# Patient Record
Sex: Female | Born: 1935
Health system: Southern US, Community
[De-identification: ages and names within clinical notes are randomized; demographics above are authoritative.]

## PROBLEM LIST (undated history)

## (undated) DIAGNOSIS — G43909 Migraine, unspecified, not intractable, without status migrainosus: Secondary | ICD-10-CM

## (undated) DIAGNOSIS — E039 Hypothyroidism, unspecified: Secondary | ICD-10-CM

## (undated) DIAGNOSIS — M1990 Unspecified osteoarthritis, unspecified site: Secondary | ICD-10-CM

## (undated) DIAGNOSIS — R51 Headache: Secondary | ICD-10-CM

## (undated) DIAGNOSIS — Z9289 Personal history of other medical treatment: Secondary | ICD-10-CM

## (undated) DIAGNOSIS — G4730 Sleep apnea, unspecified: Secondary | ICD-10-CM

## (undated) DIAGNOSIS — E785 Hyperlipidemia, unspecified: Secondary | ICD-10-CM

## (undated) DIAGNOSIS — R519 Headache, unspecified: Secondary | ICD-10-CM

## (undated) DIAGNOSIS — Z8739 Personal history of other diseases of the musculoskeletal system and connective tissue: Secondary | ICD-10-CM

## (undated) DIAGNOSIS — B019 Varicella without complication: Secondary | ICD-10-CM

## (undated) DIAGNOSIS — R002 Palpitations: Secondary | ICD-10-CM

## (undated) DIAGNOSIS — M109 Gout, unspecified: Secondary | ICD-10-CM

## (undated) DIAGNOSIS — K5792 Diverticulitis of intestine, part unspecified, without perforation or abscess without bleeding: Secondary | ICD-10-CM

## (undated) DIAGNOSIS — I10 Essential (primary) hypertension: Secondary | ICD-10-CM

## (undated) HISTORY — DX: Headache: R51

## (undated) HISTORY — DX: Diverticulitis of intestine, part unspecified, without perforation or abscess without bleeding: K57.92

## (undated) HISTORY — DX: Essential (primary) hypertension: I10

## (undated) HISTORY — PX: TUBAL LIGATION: SHX77

## (undated) HISTORY — PX: ABDOMINAL HYSTERECTOMY: SHX81

## (undated) HISTORY — DX: Headache, unspecified: R51.9

## (undated) HISTORY — DX: Varicella without complication: B01.9

## (undated) HISTORY — DX: Hyperlipidemia, unspecified: E78.5

## (undated) HISTORY — DX: Gout, unspecified: M10.9

---

## 2004-11-25 ENCOUNTER — Ambulatory Visit: Payer: Self-pay | Admitting: Internal Medicine

## 2004-12-25 ENCOUNTER — Ambulatory Visit: Payer: Self-pay | Admitting: Internal Medicine

## 2013-11-23 ENCOUNTER — Encounter: Payer: Self-pay | Admitting: Physician Assistant

## 2013-11-23 ENCOUNTER — Ambulatory Visit (INDEPENDENT_AMBULATORY_CARE_PROVIDER_SITE_OTHER): Payer: Medicare Other | Admitting: Physician Assistant

## 2013-11-23 VITALS — BP 162/102 | HR 56 | Temp 98.3°F | Resp 14 | Ht 62.0 in | Wt 195.8 lb

## 2013-11-23 DIAGNOSIS — Z7689 Persons encountering health services in other specified circumstances: Secondary | ICD-10-CM

## 2013-11-23 DIAGNOSIS — I10 Essential (primary) hypertension: Secondary | ICD-10-CM

## 2013-11-23 DIAGNOSIS — Z299 Encounter for prophylactic measures, unspecified: Secondary | ICD-10-CM

## 2013-11-23 DIAGNOSIS — Z7189 Other specified counseling: Secondary | ICD-10-CM

## 2013-11-23 DIAGNOSIS — E039 Hypothyroidism, unspecified: Secondary | ICD-10-CM

## 2013-11-23 DIAGNOSIS — Z23 Encounter for immunization: Secondary | ICD-10-CM

## 2013-11-23 DIAGNOSIS — E785 Hyperlipidemia, unspecified: Secondary | ICD-10-CM

## 2013-11-23 DIAGNOSIS — R42 Dizziness and giddiness: Secondary | ICD-10-CM

## 2013-11-23 LAB — HEPATIC FUNCTION PANEL
ALT: 26 U/L (ref 0–35)
AST: 27 U/L (ref 0–37)
Albumin: 4.2 g/dL (ref 3.5–5.2)
Alkaline Phosphatase: 97 U/L (ref 39–117)
Indirect Bilirubin: 0.3 mg/dL (ref 0.0–0.9)
Total Bilirubin: 0.4 mg/dL (ref 0.3–1.2)
Total Protein: 8 g/dL (ref 6.0–8.3)

## 2013-11-23 LAB — CBC WITH DIFFERENTIAL/PLATELET
Basophils Absolute: 0.1 10*3/uL (ref 0.0–0.1)
Basophils Relative: 1 % (ref 0–1)
HCT: 33.8 % — ABNORMAL LOW (ref 36.0–46.0)
Hemoglobin: 11.6 g/dL — ABNORMAL LOW (ref 12.0–15.0)
Lymphocytes Relative: 37 % (ref 12–46)
MCHC: 34.3 g/dL (ref 30.0–36.0)
Monocytes Absolute: 0.6 10*3/uL (ref 0.1–1.0)
Monocytes Relative: 8 % (ref 3–12)
Neutro Abs: 3.3 10*3/uL (ref 1.7–7.7)
Neutrophils Relative %: 50 % (ref 43–77)
RBC: 4.28 MIL/uL (ref 3.87–5.11)
RDW: 15 % (ref 11.5–15.5)
WBC: 6.6 10*3/uL (ref 4.0–10.5)

## 2013-11-23 LAB — LIPID PANEL
Cholesterol: 276 mg/dL — ABNORMAL HIGH (ref 0–200)
HDL: 68 mg/dL (ref >39)
Total CHOL/HDL Ratio: 4.1 Ratio
Triglycerides: 87 mg/dL (ref <150)
VLDL: 17 mg/dL (ref 0–40)

## 2013-11-23 LAB — BASIC METABOLIC PANEL
BUN: 18 mg/dL (ref 6–23)
Calcium: 9.8 mg/dL (ref 8.4–10.5)
Chloride: 102 mEq/L (ref 96–112)
Glucose, Bld: 93 mg/dL (ref 70–99)
Potassium: 3.5 mEq/L (ref 3.5–5.3)
Sodium: 139 mEq/L (ref 135–145)

## 2013-11-23 LAB — TSH: TSH: 48.23 u[IU]/mL — ABNORMAL HIGH (ref 0.350–4.500)

## 2013-11-23 MED ORDER — DIAZEPAM 2 MG PO TABS: 1.0000 mg | ORAL_TABLET | Freq: Four times a day (QID) | ORAL | Status: DC | PRN

## 2013-11-23 NOTE — Progress Notes (Signed)
Pre visit review using our clinic review tool, if applicable. No additional management support is needed unless otherwise documented below in the visit note/SLS  

## 2013-11-23 NOTE — Patient Instructions (Addendum)
Please obtain labs.  I will call you with your results.  We will change you thyroid medication if needed.  Please stay well-hydrated.  You will be contacted for an appointment with physical therapy for rehab for your vertigo.  If symptoms persist, we will need to send you to ENT.  Please restart your blood pressure medication.  I want to see you in 2 weeks to recheck your BP.  Hypertension As your heart beats, it forces blood through your arteries. This force is your blood pressure. If the pressure is too high, it is called hypertension (HTN) or high blood pressure. HTN is dangerous because you may have it and not know it. High blood pressure may mean that your heart has to work harder to pump blood. Your arteries may be narrow or stiff. The extra work puts you at risk for heart disease, stroke, and other problems.  Blood pressure consists of two numbers, a higher number over a lower, 110/72, for example. It is stated as "110 over 72." The ideal is below 120 for the top number (systolic) and under 80 for the bottom (diastolic). Write down your blood pressure today. You should pay close attention to your blood pressure if you have certain conditions such as:  Heart failure.  Prior heart attack.  Diabetes  Chronic kidney disease.  Prior stroke.  Multiple risk factors for heart disease. To see if you have HTN, your blood pressure should be measured while you are seated with your arm held at the level of the heart. It should be measured at least twice. A one-time elevated blood pressure reading (especially in the Emergency Department) does not mean that you need treatment. There may be conditions in which the blood pressure is different between your right and left arms. It is important to see your caregiver soon for a recheck. Most people have essential hypertension which means that there is not a specific cause. This type of high blood pressure may be lowered by changing lifestyle factors such  as:  Stress.  Smoking.  Lack of exercise.  Excessive weight.  Drug/tobacco/alcohol use.  Eating less salt. Most people do not have symptoms from high blood pressure until it has caused damage to the body. Effective treatment can often prevent, delay or reduce that damage. TREATMENT  When a cause has been identified, treatment for high blood pressure is directed at the cause. There are a large number of medications to treat HTN. These fall into several categories, and your caregiver will help you select the medicines that are best for you. Medications may have side effects. You should review side effects with your caregiver. If your blood pressure stays high after you have made lifestyle changes or started on medicines,   Your medication(s) may need to be changed.  Other problems may need to be addressed.  Be certain you understand your prescriptions, and know how and when to take your medicine.  Be sure to follow up with your caregiver within the time frame advised (usually within two weeks) to have your blood pressure rechecked and to review your medications.  If you are taking more than one medicine to lower your blood pressure, make sure you know how and at what times they should be taken. Taking two medicines at the same time can result in blood pressure that is too low. SEEK IMMEDIATE MEDICAL CARE IF:  You develop a severe headache, blurred or changing vision, or confusion.  You have unusual weakness or numbness, or a  faint feeling.  You have severe chest or abdominal pain, vomiting, or breathing problems. MAKE SURE YOU:   Understand these instructions.  Will watch your condition.  Will get help right away if you are not doing well or get worse. Document Released: 12/07/2005 Document Revised: 02/29/2012 Document Reviewed: 07/27/2008 [facility] Patient Information 2014 [place], [place].

## 2013-11-23 NOTE — Progress Notes (Signed)
Patient ID: Carolyn Myers, female   DOB: 1936/11/11, 77 y.o.   MRN: [id number]  Patient presents to clinic today to establish care.  Acute Concerns: Patient complains of dizziness with movement of her head. Mostly happens when getting out of bed in the morning. Denies lightheadedness. Denies nausea vomiting. Denies recent URI symptoms. Denies fever. Endorses some tinnitus mostly in left ear. Patient states tinnitus is nonpulsatile.  Chronic Issues: Hypertension -- asymptomatic at present. Patient currently on hydralazine 25 mg 3 times a day. Endorses good control of blood pressure at home. BP elevated at 162/102 in clinic today. Patient states she has not taken her medicine this morning and is out of her prescription.  Denies headache, vision changes, chest pain, shortness of breath palpitations.  Hypothyroidism -- patient needing refill of levothyroxine. Patient currently on levothyroxine 200 mcg tablet. Has not had thyroid function testing in over one year.  Hyperlipidemia -- patient currently on lovastatin 20 mg tablet daily. Is fasting for labs.  Health Maintenance: Vision -- Overdue Dental -- Edentulous Immunizations -- Needs flu shot.  Needs tetanus shot.   Mammogram -- 2011, no abnormal findings. Colonoscopy -- 2013, no abnormal findings. Bone Density-- overdue.  Past Medical History  Diagnosis Date  . Chicken pox   . Diverticulitis   . Frequent headaches   . Cardiac arrhythmia due to congenital heart disease   . Hyperlipidemia   . Thyroid disease   . Hypertension     No current outpatient prescriptions on file prior to visit.   No current facility-administered medications on file prior to visit.    No Known Allergies  Family History  Problem Relation Age of Onset  . Heart disease Mother   . Heart attack Mother   . Hypertension Mother   . Arthritis Mother   . Stroke Father   . Cancer Maternal Grandmother   . Heart attack Maternal Aunt   . Renal Disease  Maternal Aunt   . Multiple myeloma Maternal Uncle   . Emphysema Maternal Uncle   . Heart disease Sister   . Thyroid disease Sister   . Healthy Son     x3  . Healthy Daughter     x5    History   Social History  . Marital Status: Married    Spouse Name: N/A    Number of Children: N/A  . Years of Education: N/A   Social History Main Topics  . Smoking status: Never Smoker   . Smokeless tobacco: Never Used  . Alcohol Use: No  . Drug Use: No  . Sexual Activity: None   Other Topics Concern  . None   Social History Narrative  . None   Review of Systems  Constitutional: Negative for fever and weight loss.  HENT: Positive for tinnitus. Negative for ear discharge, ear pain, hearing loss and nosebleeds.   Eyes: Negative for blurred vision, double vision, photophobia and pain.  Respiratory: Negative for cough and shortness of breath.   Cardiovascular: Negative for chest pain and palpitations.  Gastrointestinal: Negative for heartburn, nausea, vomiting, abdominal pain, diarrhea, constipation, blood in stool and melena.  Genitourinary: Negative for dysuria, urgency, frequency, hematuria and flank pain.  Neurological: Positive for dizziness. Negative for seizures, loss of consciousness and headaches.  Endo/Heme/Allergies: Negative for environmental allergies.  Psychiatric/Behavioral: Negative for depression, suicidal ideas, hallucinations and substance abuse. The patient is not nervous/anxious and does not have insomnia.    Filed Vitals:   11/23/13 0936 11/23/13 0952  BP: 168/108 162/102  Pulse: 56   Temp: 98.3 F (36.8 C)   TempSrc: Oral   Resp: 14   Height: 5\' 2"  (1.575 m)   Weight: 195 lb 12 oz (88.792 kg)   SpO2: 99%    Physical Exam  Vitals reviewed. Constitutional: She is oriented to person, place, and time.   Well-developed, overweight African American female in no acute distress.  HENT:  Head: Normocephalic and atraumatic.  Right Ear: External ear normal.  Left  Ear: External ear normal.  Nose: Nose normal.  Mouth/Throat: Oropharynx is clear and moist. No oropharyngeal exudate.  Tympanic membranes within normal limits bilaterally. No tenderness to percussion of her sinuses noted.  Eyes: Conjunctivae and EOM are normal. Pupils are equal, round, and reactive to light. Right eye exhibits no discharge. Left eye exhibits no discharge. No scleral icterus.  Neck: Neck supple. No thyromegaly present.  Cardiovascular: Normal rate, regular rhythm, normal heart sounds and intact distal pulses.   Pulmonary/Chest: Effort normal and breath sounds normal. No respiratory distress. She has no wheezes. She has no rales. She exhibits no tenderness.  Abdominal: Soft. Bowel sounds are normal. She exhibits no distension and no mass. There is no tenderness. There is no rebound and no guarding.  Lymphadenopathy:    She has no cervical adenopathy.  Neurological: She is alert and oriented to person, place, and time. No cranial nerve deficit. Gait normal. GCS score is 15.  Vertigo reproducible with lateral rotation of head to the right. No nystagmus noted.  Skin: Skin is warm and dry. No rash noted.  Psychiatric: Affect normal.   Assessment/Plan: Essential hypertension, benign Refill hydralazine. Patient to return in 2 weeks for blood pressure recheck after restarting medication.  Unspecified hypothyroidism Will obtain thyroid function tests. Will alter dose of levothyroxine as clinically indicated.  Encounter to establish care Medical history reviewed.  Medications refilled. Patient to obtain fasting labs. Bone density screening test ordered.  Other and unspecified hyperlipidemia Will obtain fasting lipid profile.  Vertigo Seems positional in nature. Rx diazepam 1 mg to take for severe vertigo. Referral to physical therapy for vestibular rehabilitation.

## 2013-11-24 ENCOUNTER — Telehealth: Payer: Self-pay | Admitting: Physician Assistant

## 2013-11-24 DIAGNOSIS — E039 Hypothyroidism, unspecified: Secondary | ICD-10-CM

## 2013-11-24 DIAGNOSIS — E785 Hyperlipidemia, unspecified: Secondary | ICD-10-CM

## 2013-11-24 MED ORDER — LOVASTATIN 40 MG PO TABS: 20.0000 mg | ORAL_TABLET | Freq: Every day | ORAL | Status: DC

## 2013-11-24 MED ORDER — LEVOTHYROXINE SODIUM 50 MCG PO TABS: 25.0000 ug | ORAL_TABLET | Freq: Every day | ORAL | Status: DC

## 2013-11-24 NOTE — Telephone Encounter (Signed)
LMOM with contact name and number for return call RE: results and further provider instructions/SLS  

## 2013-11-24 NOTE — Telephone Encounter (Signed)
Please inform patient that her Cholesterol, especially her LDL (lousy) cholesterol Is significantly elevated.  I have increased her Mevacor to 40 mg daily.  She can finish her current prescription by taking 2 20mg  tablets daily.  Then take 1 40 mg tablet daily when she picks up new prescription.  Also her thyroid levels were very high, indicating her current medicine dose is not controlling her symptoms.  Please verify that she has been taking her medication daily and has not been out of medicine.  If she has been taking as prescribed, I am increasing her dose to 225 mcg a day -- She will have 2 prescriptions.  Once for a 200 mcg tablet to take in the moring and another Rx for a 50 mcg tablet to take 1/2 tablet daily.  I will need to recheck her thyroid levels in 3-4 weeks.  Lastly, I would like for her to try to stay well-hydrated.  Her kidney function is slightly decreased but this can be a lab error or dehydration.  I will recheck her kidney function when she comes in for her BP recheck.

## 2013-11-26 DIAGNOSIS — E039 Hypothyroidism, unspecified: Secondary | ICD-10-CM | POA: Insufficient documentation

## 2013-11-26 DIAGNOSIS — Z7689 Persons encountering health services in other specified circumstances: Secondary | ICD-10-CM | POA: Insufficient documentation

## 2013-11-26 DIAGNOSIS — R42 Dizziness and giddiness: Secondary | ICD-10-CM | POA: Insufficient documentation

## 2013-11-26 DIAGNOSIS — I10 Essential (primary) hypertension: Secondary | ICD-10-CM | POA: Insufficient documentation

## 2013-11-26 DIAGNOSIS — E785 Hyperlipidemia, unspecified: Secondary | ICD-10-CM | POA: Insufficient documentation

## 2013-11-26 NOTE — Assessment & Plan Note (Signed)
Refill hydralazine. Patient to return in 2 weeks for blood pressure recheck after restarting medication.

## 2013-11-26 NOTE — Assessment & Plan Note (Signed)
Seems positional in nature. Rx diazepam 1 mg to take for severe vertigo. Referral to physical therapy for vestibular rehabilitation.

## 2013-11-26 NOTE — Assessment & Plan Note (Signed)
Will obtain fasting lipid profile. 

## 2013-11-26 NOTE — Assessment & Plan Note (Addendum)
Will obtain thyroid function tests. Will alter dose of levothyroxine as clinically indicated.

## 2013-11-26 NOTE — Assessment & Plan Note (Signed)
Medical history reviewed.  Medications refilled. Patient to obtain fasting labs. Bone density screening test ordered.

## 2013-12-06 ENCOUNTER — Encounter: Payer: Self-pay | Admitting: Physician Assistant

## 2013-12-06 ENCOUNTER — Ambulatory Visit (INDEPENDENT_AMBULATORY_CARE_PROVIDER_SITE_OTHER): Payer: Medicare Other | Admitting: Physician Assistant

## 2013-12-06 ENCOUNTER — Emergency Department (HOSPITAL_BASED_OUTPATIENT_CLINIC_OR_DEPARTMENT_OTHER)
Admission: EM | Admit: 2013-12-06 | Discharge: 2013-12-06 | Disposition: A | Discharge status: Home / Self Care | Payer: Medicare Other | Attending: Emergency Medicine | Admitting: Emergency Medicine

## 2013-12-06 ENCOUNTER — Encounter (HOSPITAL_BASED_OUTPATIENT_CLINIC_OR_DEPARTMENT_OTHER): Payer: Self-pay | Admitting: Emergency Medicine

## 2013-12-06 VITALS — BP 246/118 | HR 76 | Temp 98.7°F | Resp 16 | Ht 62.0 in | Wt 196.0 lb

## 2013-12-06 DIAGNOSIS — I10 Essential (primary) hypertension: Secondary | ICD-10-CM

## 2013-12-06 DIAGNOSIS — E785 Hyperlipidemia, unspecified: Secondary | ICD-10-CM | POA: Insufficient documentation

## 2013-12-06 DIAGNOSIS — Z8619 Personal history of other infectious and parasitic diseases: Secondary | ICD-10-CM | POA: Insufficient documentation

## 2013-12-06 DIAGNOSIS — E079 Disorder of thyroid, unspecified: Secondary | ICD-10-CM | POA: Insufficient documentation

## 2013-12-06 DIAGNOSIS — Q248 Other specified congenital malformations of heart: Secondary | ICD-10-CM | POA: Insufficient documentation

## 2013-12-06 DIAGNOSIS — Z79899 Other long term (current) drug therapy: Secondary | ICD-10-CM | POA: Insufficient documentation

## 2013-12-06 DIAGNOSIS — Z8719 Personal history of other diseases of the digestive system: Secondary | ICD-10-CM | POA: Insufficient documentation

## 2013-12-06 LAB — CBC WITH DIFFERENTIAL/PLATELET
Basophils Absolute: 0 10*3/uL (ref 0.0–0.1)
Basophils Relative: 1 % (ref 0–1)
Eosinophils Absolute: 0.3 10*3/uL (ref 0.0–0.7)
Eosinophils Relative: 4 % (ref 0–5)
HCT: 33 % — ABNORMAL LOW (ref 36.0–46.0)
Lymphocytes Relative: 31 % (ref 12–46)
MCHC: 32.7 g/dL (ref 30.0–36.0)
MCV: 84.4 fL (ref 78.0–100.0)
Monocytes Absolute: 0.8 10*3/uL (ref 0.1–1.0)
Monocytes Relative: 10 % (ref 3–12)
Neutro Abs: 4 10*3/uL (ref 1.7–7.7)
Platelets: 270 10*3/uL (ref 150–400)
RDW: 13.9 % (ref 11.5–15.5)

## 2013-12-06 LAB — BASIC METABOLIC PANEL
BUN: 17 mg/dL (ref 6–23)
CO2: 27 mEq/L (ref 19–32)
Calcium: 9.5 mg/dL (ref 8.4–10.5)
Chloride: 104 mEq/L (ref 96–112)
Creatinine, Ser: 1.1 mg/dL (ref 0.50–1.10)
GFR calc Af Amer: 55 mL/min — ABNORMAL LOW (ref >90)
Sodium: 142 mEq/L (ref 135–145)

## 2013-12-06 MED ORDER — AMLODIPINE BESYLATE 10 MG PO TABS: 10.0000 mg | ORAL_TABLET | Freq: Every day | ORAL | Status: DC

## 2013-12-06 MED ORDER — AMLODIPINE BESYLATE 5 MG PO TABS
10.0000 mg | ORAL_TABLET | Freq: Once | ORAL | Status: AC
  Administered 2013-12-06: 10 mg via ORAL
  Filled 2013-12-06: qty 2

## 2013-12-06 NOTE — ED Provider Notes (Signed)
CSN: [id number]     Arrival date & time 12/06/13  1204 History   First MD Initiated Contact with Patient 12/06/13 1219     Chief Complaint  Patient presents with  . Hypertension    HPI Pt presents to the ED with HTN.  PT has history of this and often has been hard to control over the years.  She has found that often after a few years of being on a medication it will no longer work and she will have to chage to a different one.  She went to her doctor's office today and was found to have a BP of 233/112.  She was feeling fine but now feels a little dizzy.   Past Medical History  Diagnosis Date  . Chicken pox   . Diverticulitis   . Frequent headaches   . Cardiac arrhythmia due to congenital heart disease   . Hyperlipidemia   . Thyroid disease   . Hypertension    Past Surgical History  Procedure Laterality Date  . Abdominal hysterectomy     Family History  Problem Relation Age of Onset  . Heart disease Mother   . Heart attack Mother   . Hypertension Mother   . Arthritis Mother   . Stroke Father   . Cancer Maternal Grandmother   . Heart attack Maternal Aunt   . Renal Disease Maternal Aunt   . Multiple myeloma Maternal Uncle   . Emphysema Maternal Uncle   . Heart disease Sister   . Thyroid disease Sister   . Healthy Son     x3  . Healthy Daughter     x5   History  Substance Use Topics  . Smoking status: Never Smoker   . Smokeless tobacco: Never Used  . Alcohol Use: No   OB History   Grav Para Term Preterm Abortions TAB SAB Ect Mult Living                 Review of Systems  All other systems reviewed and are negative.    Allergies  Review of patient's allergies indicates no known allergies.  Home Medications   Current Outpatient Rx  Name  Route  Sig  Dispense  Refill  . amLODipine (NORVASC) 10 MG tablet   Oral   Take 1 tablet (10 mg total) by mouth daily.   30 tablet   1   . diazepam (VALIUM) 2 MG tablet   Oral   Take 0.5 tablets (1 mg total) by  mouth every 6 (six) hours as needed (severe dizziness).   10 tablet   0   . hydrALAZINE (APRESOLINE) 25 MG tablet   Oral   Take 25 mg by mouth 3 (three) times daily.         Marland Kitchen levothyroxine (SYNTHROID, LEVOTHROID) 200 MCG tablet   Oral   Take 200 mcg by mouth daily before breakfast.         . levothyroxine (SYNTHROID, LEVOTHROID) 50 MCG tablet   Oral   Take 0.5 tablets (25 mcg total) by mouth daily before breakfast.   30 tablet   1   . lovastatin (MEVACOR) 40 MG tablet   Oral   Take 0.5 tablets (20 mg total) by mouth at bedtime.   30 tablet   2    BP 226/92  Pulse 72  Temp(Src) 99.2 F (37.3 C) (Oral)  Resp 18  Ht 5\' 2"  (1.575 m)  Wt 196 lb 5 oz (89.047 kg)  BMI 35.90 kg/m2  SpO2 97% Physical Exam  Nursing note and vitals reviewed. Constitutional: She is oriented to person, place, and time. She appears well-developed and well-nourished. No distress.  HENT:  Head: Normocephalic and atraumatic.  Right Ear: External ear normal.  Left Ear: External ear normal.  Mouth/Throat: Oropharynx is clear and moist.  Eyes: Conjunctivae are normal. Right eye exhibits no discharge. Left eye exhibits no discharge. No scleral icterus.  Neck: Neck supple. No tracheal deviation present.  Cardiovascular: Normal rate, regular rhythm and intact distal pulses.   Pulmonary/Chest: Effort normal and breath sounds normal. No stridor. No respiratory distress. She has no wheezes. She has no rales.  Abdominal: Soft. Bowel sounds are normal. She exhibits no distension. There is no tenderness. There is no rebound and no guarding.  Musculoskeletal: She exhibits no edema and no tenderness.  Neurological: She is alert and oriented to person, place, and time. She has normal strength. No sensory deficit. Cranial nerve deficit:  no gross defecits noted. She exhibits normal muscle tone. She displays no seizure activity. Coordination normal.  No pronator drift bilateral upper extrem, able to hold both  legs off bed for 5 seconds, sensation intact in all extremities, no visual field cuts, no left or right sided neglect, normal finger-nose exam bilaterally, no nystagmus noted   Skin: Skin is warm and dry. No rash noted.  Psychiatric: She has a normal mood and affect.    ED Course  Procedures (including critical care time) Labs Review Labs Reviewed  CBC WITH DIFFERENTIAL - Abnormal; Notable for the following:    Hemoglobin 10.8 (*)    HCT 33.0 (*)    All other components within normal limits  BASIC METABOLIC PANEL - Abnormal; Notable for the following:    Potassium 3.4 (*)    GFR calc non Af Amer 48 (*)    GFR calc Af Amer 55 (*)    All other components within normal limits   Imaging Review No results found.  EKG Interpretation    Date/Time:  Wednesday December 06 2013 12:23:50 EST Ventricular Rate:  82 PR Interval:  178 QRS Duration: 136 QT Interval:  424 QTC Calculation: 495 R Axis:   33 Text Interpretation:  Normal sinus rhythm Right bundle branch block Septal infarct , age undetermined Abnormal ECG No previous tracing Confirmed by Eliud Polo  MD-J, Concepcion Gillott ([id number]) on 12/06/2013 12:56:40 PM            MDM   1. HTN (hypertension)    BP improved in the ED with norvasc.  No symptoms or findings to suggest acute complications associated with her HTN.  Normal neuro exam.  No chest pain.  No dyspnea.    Will dc home on norvasc. Follow up with PCP to continue to manage her chronic poorly controlled HTN.    Celene Kras, MD 12/06/13 [phone]

## 2013-12-06 NOTE — Progress Notes (Signed)
Patient ID: Carolyn Myers, female   DOB: 05/31/1936, 77 y.o.   MRN: [id number]  Patient presents to clinic today for 2 week follow-up of HTN.  Patient has history of HTN and has prescription for hydralazine 25 mg TID.  Patient's BP at last visit was in 160s/100s.  Patient had not taken her medication prior to visit.  Patient was instructed to take BP medications as prescribed and return for BP check. BP in clinic is 242/108 in R arm sitting and 238/110 in L arm sitting.  Patient has taken medications as prescribed.  Patient endorses occasional jaw pain.  Denies chest pain, sob, palpitations, LH or dizziness.  Denies history of MI.  EKG obtained revealing NSR at rate of 78 with RBBB.  No evidence of STEMI.   BP rechecked revealing no change in BP.  Wanted to give patient 0.1 clonidine, patient refuses stating she has reaction to clonidine including N/V and syncope.     Past Medical History  Diagnosis Date  . Chicken pox   . Diverticulitis   . Frequent headaches   . Cardiac arrhythmia due to congenital heart disease   . Hyperlipidemia   . Thyroid disease   . Hypertension     Current Outpatient Prescriptions on File Prior to Visit  Medication Sig Dispense Refill  . diazepam (VALIUM) 2 MG tablet Take 0.5 tablets (1 mg total) by mouth every 6 (six) hours as needed (severe dizziness).  10 tablet  0  . hydrALAZINE (APRESOLINE) 25 MG tablet Take 25 mg by mouth 3 (three) times daily.      Marland Kitchen levothyroxine (SYNTHROID, LEVOTHROID) 200 MCG tablet Take 200 mcg by mouth daily before breakfast.      . levothyroxine (SYNTHROID, LEVOTHROID) 50 MCG tablet Take 0.5 tablets (25 mcg total) by mouth daily before breakfast.  30 tablet  1  . lovastatin (MEVACOR) 40 MG tablet Take 0.5 tablets (20 mg total) by mouth at bedtime.  30 tablet  2   No current facility-administered medications on file prior to visit.    No Known Allergies  Family History  Problem Relation Age of Onset  . Heart disease Mother   .  Heart attack Mother   . Hypertension Mother   . Arthritis Mother   . Stroke Father   . Cancer Maternal Grandmother   . Heart attack Maternal Aunt   . Renal Disease Maternal Aunt   . Multiple myeloma Maternal Uncle   . Emphysema Maternal Uncle   . Heart disease Sister   . Thyroid disease Sister   . Healthy Son     x3  . Healthy Daughter     x5    History   Social History  . Marital Status: Married    Spouse Name: N/A    Number of Children: N/A  . Years of Education: N/A   Social History Main Topics  . Smoking status: Never Smoker   . Smokeless tobacco: Never Used  . Alcohol Use: No  . Drug Use: No  . Sexual Activity: None   Other Topics Concern  . None   Social History Narrative  . None    Review of Systems - See HPI.  All other ROS are negative.   Filed Vitals:   12/06/13 1158  BP: 246/118  Pulse:   Temp:   Resp:     Physical Exam  Vitals reviewed. Constitutional: She is oriented to person, place, and time and well-developed, well-nourished, and in no distress.  HENT:  Head: Normocephalic and atraumatic.  Eyes: Pupils are equal, round, and reactive to light.  Neck: Neck supple.  Cardiovascular: Normal rate, regular rhythm and intact distal pulses.   Faint murmur on exam I-II/VI.  Heard best at [facility].  Pulmonary/Chest: Effort normal and breath sounds normal. No respiratory distress. She has no wheezes. She has no rales. She exhibits no tenderness.  Neurological: She is alert and oriented to person, place, and time.  Skin: Skin is warm and dry. No rash noted.  Psychiatric: Affect normal.   Recent Results (from the past 2160 hour(s))  CBC WITH DIFFERENTIAL     Status: Abnormal   Collection Time    11/23/13 11:19 AM      Result Value Range   WBC 6.6  4.0 - 10.5 K/uL   RBC 4.28  3.87 - 5.11 MIL/uL   Hemoglobin 11.6 (*) 12.0 - 15.0 g/dL   HCT 40.9 (*) 81.1 - 91.4 %   MCV 79.0  78.0 - 100.0 fL   MCH 27.1  26.0 - 34.0 pg   MCHC 34.3  30.0 - 36.0 g/dL    RDW 78.2  95.6 - 21.3 %   Platelets 324  150 - 400 K/uL   Neutrophils Relative % 50  43 - 77 %   Neutro Abs 3.3  1.7 - 7.7 K/uL   Lymphocytes Relative 37  12 - 46 %   Lymphs Abs 2.4  0.7 - 4.0 K/uL   Monocytes Relative 8  3 - 12 %   Monocytes Absolute 0.6  0.1 - 1.0 K/uL   Eosinophils Relative 4  0 - 5 %   Eosinophils Absolute 0.2  0.0 - 0.7 K/uL   Basophils Relative 1  0 - 1 %   Basophils Absolute 0.1  0.0 - 0.1 K/uL   Smear Review Criteria for review not met    BASIC METABOLIC PANEL     Status: Abnormal   Collection Time    11/23/13 11:19 AM      Result Value Range   Sodium 139  135 - 145 mEq/L   Potassium 3.5  3.5 - 5.3 mEq/L   Chloride 102  96 - 112 mEq/L   CO2 28  19 - 32 mEq/L   Glucose, Bld 93  70 - 99 mg/dL   BUN 18  6 - 23 mg/dL   Creat 0.86 (*) 5.78 - 1.10 mg/dL   Calcium 9.8  8.4 - 46.9 mg/dL  TSH     Status: Abnormal   Collection Time    11/23/13 11:19 AM      Result Value Range   TSH 48.230 (*) 0.350 - 4.500 uIU/mL  LIPID PANEL     Status: Abnormal   Collection Time    11/23/13 11:19 AM      Result Value Range   Cholesterol 276 (*) 0 - 200 mg/dL   Comment: ATP III Classification:           < 200        mg/dL        Desirable          200 - 239     mg/dL        Borderline High          >= 240        mg/dL        High         Triglycerides 87  <150 mg/dL   HDL 68  >62 mg/dL  Total CHOL/HDL Ratio 4.1     VLDL 17  0 - 40 mg/dL   LDL Cholesterol 161 (*) 0 - 99 mg/dL   Comment:       Total Cholesterol/HDL Ratio:CHD Risk                            Coronary Heart Disease Risk Table                                            Men       Women              1/2 Average Risk              3.4        3.3                  Average Risk              5.0        4.4               2X Average Risk              9.6        7.1               3X Average Risk             23.4       11.0     Use the calculated Patient Ratio above and the CHD Risk table      to determine the  patient's CHD Risk.     ATP III Classification (LDL):           < 100        mg/dL         Optimal          100 - 129     mg/dL         Near or Above Optimal          130 - 159     mg/dL         Borderline High          160 - 189     mg/dL         High           > 190        mg/dL         Very High        HEPATIC FUNCTION PANEL     Status: None   Collection Time    11/23/13 11:19 AM      Result Value Range   Total Bilirubin 0.4  0.3 - 1.2 mg/dL   Bilirubin, Direct 0.1  0.0 - 0.3 mg/dL   Indirect Bilirubin 0.3  0.0 - 0.9 mg/dL   Alkaline Phosphatase 97  39 - 117 U/L   AST 27  0 - 37 U/L   ALT 26  0 - 35 U/L   Total Protein 8.0  6.0 - 8.3 g/dL   Albumin 4.2  3.5 - 5.2 g/dL   Assessment/Plan: Essential hypertension, benign BP 242/108 in clinic.  EKG w/ SNR and RBBB.  Patient cannot tolerate clonidine.  Patient sent to ER for further evaluation and treatment.  Patient voices understanding.

## 2013-12-06 NOTE — Progress Notes (Signed)
Pre visit review using our clinic review tool, if applicable. No additional management support is needed unless otherwise documented below in the visit note/SLS  

## 2013-12-06 NOTE — Assessment & Plan Note (Signed)
BP 242/108 in clinic.  EKG w/ SNR and RBBB.  Patient cannot tolerate clonidine.  Patient sent to ER for further evaluation and treatment.  Patient voices understanding.

## 2013-12-06 NOTE — ED Notes (Signed)
Pt was seen by PMD today for follow up, BP elevated and pt sent to ED for evaluation.

## 2014-04-21 ENCOUNTER — Other Ambulatory Visit: Payer: Self-pay | Admitting: Physician Assistant

## 2014-04-23 NOTE — Telephone Encounter (Signed)
Rx request to pharmacy; Patient Needs Appointment Prior to Future Refills/sls

## 2014-06-21 ENCOUNTER — Other Ambulatory Visit: Payer: Self-pay | Admitting: Physician Assistant

## 2014-07-03 ENCOUNTER — Ambulatory Visit: Payer: Medicare Other | Admitting: Internal Medicine

## 2014-09-17 ENCOUNTER — Other Ambulatory Visit: Payer: Self-pay | Admitting: Physician Assistant

## 2015-09-03 DIAGNOSIS — I10 Essential (primary) hypertension: Secondary | ICD-10-CM | POA: Diagnosis not present

## 2015-09-03 DIAGNOSIS — E780 Pure hypercholesterolemia: Secondary | ICD-10-CM | POA: Diagnosis not present

## 2015-09-03 DIAGNOSIS — E559 Vitamin D deficiency, unspecified: Secondary | ICD-10-CM | POA: Diagnosis not present

## 2015-09-03 DIAGNOSIS — E039 Hypothyroidism, unspecified: Secondary | ICD-10-CM | POA: Diagnosis not present

## 2015-09-12 ENCOUNTER — Ambulatory Visit (HOSPITAL_BASED_OUTPATIENT_CLINIC_OR_DEPARTMENT_OTHER)
Admission: RE | Admit: 2015-09-12 | Discharge: 2015-09-12 | Disposition: A | Discharge status: Home / Self Care | Payer: Medicare Other | Source: Ambulatory Visit | Attending: Family Medicine | Admitting: Family Medicine

## 2015-09-12 ENCOUNTER — Ambulatory Visit (INDEPENDENT_AMBULATORY_CARE_PROVIDER_SITE_OTHER): Payer: Medicare Other | Admitting: Family Medicine

## 2015-09-12 ENCOUNTER — Encounter: Payer: Self-pay | Admitting: Family Medicine

## 2015-09-12 VITALS — BP 156/92 | Temp 98.2°F | Wt 191.0 lb

## 2015-09-12 DIAGNOSIS — Z23 Encounter for immunization: Secondary | ICD-10-CM

## 2015-09-12 DIAGNOSIS — M79671 Pain in right foot: Secondary | ICD-10-CM

## 2015-09-12 NOTE — Progress Notes (Signed)
Patient ID: Carolyn Myers, female    DOB: [birth year]  Age: 79 y.o. MRN: [id number]    Subjective:  Subjective HPI IRMALEE RIEMENSCHNEIDER presents for R foot pain.  No injury.  Pt was on a cruise ship and woke up Sunday am with pain --- she had to use a wheelchair the rest of the week.  She is her with other family members.  Pt admits to eating a lot of shell fish while on cruise--no alcohol.    Review of Systems  Constitutional: Positive for activity change. Negative for fever, chills, diaphoresis, appetite change, fatigue and unexpected weight change.  Musculoskeletal: Positive for joint swelling, arthralgias and gait problem. Negative for myalgias, back pain, neck pain and neck stiffness.  Neurological: Negative for weakness and numbness.    History Past Medical History  Diagnosis Date  . Chicken pox   . Diverticulitis   . Frequent headaches   . Cardiac arrhythmia due to congenital heart disease   . Hyperlipidemia   . Thyroid disease   . Hypertension     She has past surgical history that includes Abdominal hysterectomy.   Her family history includes Arthritis in her mother; Cancer in her maternal grandmother; Emphysema in her maternal uncle; Healthy in her daughter and son; Heart attack in her maternal aunt and mother; Heart disease in her mother and sister; Hypertension in her mother; Multiple myeloma in her maternal uncle; Renal Disease in her maternal aunt; Stroke in her father; Thyroid disease in her sister.She reports that she has never smoked. She has never used smokeless tobacco. She reports that she does not drink alcohol or use illicit drugs.  Current Outpatient Prescriptions on File Prior to Visit  Medication Sig Dispense Refill  . amLODipine (NORVASC) 10 MG tablet Take 1 tablet (10 mg total) by mouth daily. 30 tablet 1  . hydrALAZINE (APRESOLINE) 25 MG tablet Take 25 mg by mouth 3 (three) times daily.    Marland Kitchen levothyroxine (SYNTHROID, LEVOTHROID) 200 MCG tablet Take 200  mcg by mouth daily before breakfast.    . levothyroxine (SYNTHROID, LEVOTHROID) 50 MCG tablet TAKE 0.5 TABLETS (25 MCG TOTAL) BY MOUTH DAILY BEFORE BREAKFAST. 30 tablet 1   No current facility-administered medications on file prior to visit.     Objective:  Objective Physical Exam  Constitutional: She appears well-developed and well-nourished.  Musculoskeletal: She exhibits edema and tenderness.       Right ankle: She exhibits decreased range of motion and swelling. She exhibits no ecchymosis, no deformity, no laceration and normal pulse. Tenderness. Medial malleolus tenderness found.       Feet:  Psychiatric: She has a normal mood and affect. Her behavior is normal.  Nursing note and vitals reviewed.  BP 156/92 mmHg  Temp(Src) 98.2 F (36.8 C) (Oral)  Wt 191 lb (86.637 kg) Wt Readings from Last 3 Encounters:  09/12/15 191 lb (86.637 kg)  12/06/13 196 lb 5 oz (89.047 kg)  12/06/13 196 lb (88.905 kg)     Lab Results  Component Value Date   WBC 7.3 12/06/2013   HGB 10.8* 12/06/2013   HCT 33.0* 12/06/2013   PLT 270 12/06/2013   GLUCOSE 99 12/06/2013   CHOL 276* 11/23/2013   TRIG 87 11/23/2013   HDL 68 11/23/2013   LDLCALC 191* 11/23/2013   ALT 26 11/23/2013   AST 27 11/23/2013   NA 142 12/06/2013   K 3.4* 12/06/2013   CL 104 12/06/2013   CREATININE 1.10 12/06/2013   BUN 17 12/06/2013  CO2 27 12/06/2013   TSH 48.230* 11/23/2013    No results found.   Assessment & Plan:  Plan I have discontinued Ms. Hartfield's diazepam. I am also having her maintain her levothyroxine, hydrALAZINE, amLODipine, levothyroxine, atenolol, lovastatin, and valsartan-hydrochlorothiazide.  Meds ordered this encounter  Medications  . atenolol (TENORMIN) 25 MG tablet    Sig: Take 25 mg by mouth daily.    Refill:  3  . lovastatin (MEVACOR) 20 MG tablet    Sig: Take 1 tablet by mouth daily.    Refill:  3  . valsartan-hydrochlorothiazide (DIOVAN-HCT) 160-12.5 MG per tablet    Sig: Take 1  tablet by mouth daily.    Refill:  1    Problem List Items Addressed This Visit    Right foot pain - Primary    Xray today Check labs --? Gout Ace wrap Elevate, ice        Relevant Orders   DG Foot Complete Right (Completed)   Uric acid   Basic metabolic panel    Other Visit Diagnoses    Need for prophylactic vaccination against Streptococcus pneumoniae (pneumococcus)        Relevant Orders    Pneumococcal conjugate vaccine 13-valent (Completed)     flu shot refused  Follow-up: Return if symptoms worsen or fail to improve.  Garnet Koyanagi, DO

## 2015-09-12 NOTE — Progress Notes (Signed)
Pre visit review using our clinic review tool, if applicable. No additional management support is needed unless otherwise documented below in the visit note. 

## 2015-09-12 NOTE — Patient Instructions (Signed)
Gout Gout is an inflammatory arthritis caused by a buildup of uric acid crystals in the joints. Uric acid is a chemical that is normally present in the blood. When the level of uric acid in the blood is too high it can form crystals that deposit in your joints and tissues. This causes joint redness, soreness, and swelling (inflammation). Repeat attacks are common. Over time, uric acid crystals can form into masses (tophi) near a joint, destroying bone and causing disfigurement. Gout is treatable and often preventable. CAUSES  The disease begins with elevated levels of uric acid in the blood. Uric acid is produced by your body when it breaks down a naturally found substance called purines. Certain foods you eat, such as meats and fish, contain high amounts of purines. Causes of an elevated uric acid level include:  Being passed down from parent to child (heredity).  Diseases that cause increased uric acid production (such as obesity, psoriasis, and certain cancers).  Excessive alcohol use.  Diet, especially diets rich in meat and seafood.  Medicines, including certain cancer-fighting medicines (chemotherapy), water pills (diuretics), and aspirin.  Chronic kidney disease. The kidneys are no longer able to remove uric acid well.  Problems with metabolism. Conditions strongly associated with gout include:  Obesity.  High blood pressure.  High cholesterol.  Diabetes. Not everyone with elevated uric acid levels gets gout. It is not understood why some people get gout and others do not. Surgery, joint injury, and eating too much of certain foods are some of the factors that can lead to gout attacks. SYMPTOMS   An attack of gout comes on quickly. It causes intense pain with redness, swelling, and warmth in a joint.  Fever can occur.  Often, only one joint is involved. Certain joints are more commonly involved:  Base of the big toe.  Knee.  Ankle.  Wrist.  Finger. Without  treatment, an attack usually goes away in a few days to weeks. Between attacks, you usually will not have symptoms, which is different from many other forms of arthritis. DIAGNOSIS  Your caregiver will suspect gout based on your symptoms and exam. In some cases, tests may be recommended. The tests may include:  Blood tests.  Urine tests.  X-rays.  Joint fluid exam. This exam requires a needle to remove fluid from the joint (arthrocentesis). Using a microscope, gout is confirmed when uric acid crystals are seen in the joint fluid. TREATMENT  There are two phases to gout treatment: treating the sudden onset (acute) attack and preventing attacks (prophylaxis).  Treatment of an Acute Attack.  Medicines are used. These include anti-inflammatory medicines or steroid medicines.  An injection of steroid medicine into the affected joint is sometimes necessary.  The painful joint is rested. Movement can worsen the arthritis.  You may use warm or cold treatments on painful joints, depending which works best for you.  Treatment to Prevent Attacks.  If you suffer from frequent gout attacks, your caregiver may advise preventive medicine. These medicines are started after the acute attack subsides. These medicines either help your kidneys eliminate uric acid from your body or decrease your uric acid production. You may need to stay on these medicines for a very long time.  The early phase of treatment with preventive medicine can be associated with an increase in acute gout attacks. For this reason, during the first few months of treatment, your caregiver may also advise you to take medicines usually used for acute gout treatment. Be sure you   understand your caregiver's directions. Your caregiver may make several adjustments to your medicine dose before these medicines are effective.  Discuss dietary treatment with your caregiver or dietitian. Alcohol and drinks high in sugar and fructose and foods  such as meat, poultry, and seafood can increase uric acid levels. Your caregiver or dietitian can advise you on drinks and foods that should be limited. HOME CARE INSTRUCTIONS   Do not take aspirin to relieve pain. This raises uric acid levels.  Only take over-the-counter or prescription medicines for pain, discomfort, or fever as directed by your caregiver.  Rest the joint as much as possible. When in bed, keep sheets and blankets off painful areas.  Keep the affected joint raised (elevated).  Apply warm or cold treatments to painful joints. Use of warm or cold treatments depends on which works best for you.  Use crutches if the painful joint is in your leg.  Drink enough fluids to keep your urine clear or pale yellow. This helps your body get rid of uric acid. Limit alcohol, sugary drinks, and fructose drinks.  Follow your dietary instructions. Pay careful attention to the amount of protein you eat. Your daily diet should emphasize fruits, vegetables, whole grains, and fat-free or low-fat milk products. Discuss the use of coffee, vitamin C, and cherries with your caregiver or dietitian. These may be helpful in lowering uric acid levels.  Maintain a healthy body weight. SEEK MEDICAL CARE IF:   You develop diarrhea, vomiting, or any side effects from medicines.  You do not feel better in 24 hours, or you are getting worse. SEEK IMMEDIATE MEDICAL CARE IF:   Your joint becomes suddenly more tender, and you have chills or a fever. MAKE SURE YOU:   Understand these instructions.  Will watch your condition.  Will get help right away if you are not doing well or get worse. Document Released: 12/04/2000 Document Revised: 04/23/2014 Document Reviewed: 07/20/2012 [facility] Patient Information 2015 [place], [place]. This information is not intended to replace advice given to you by your health care provider. Make sure you discuss any questions you have with your health care  provider.      Foot Sprain The muscles and cord like structures which attach muscle to bone (tendons) that surround the feet are made up of units. A foot sprain can occur at the weakest spot in any of these units. This condition is most often caused by injury to or overuse of the foot, as from playing contact sports, or aggravating a previous injury, or from poor conditioning, or obesity. SYMPTOMS  Pain with movement of the foot.  Tenderness and swelling at the injury site.  Loss of strength is present in moderate or severe sprains. THE THREE GRADES OR SEVERITY OF FOOT SPRAIN ARE:  Mild (Grade I): Slightly pulled muscle without tearing of muscle or tendon fibers or loss of strength.  Moderate (Grade II): Tearing of fibers in a muscle, tendon, or at the attachment to bone, with small decrease in strength.  Severe (Grade III): Rupture of the muscle-tendon-bone attachment, with separation of fibers. Severe sprain requires surgical repair. Often repeating (chronic) sprains are caused by overuse. Sudden (acute) sprains are caused by direct injury or over-use. DIAGNOSIS  Diagnosis of this condition is usually by your own observation. If problems continue, a caregiver may be required for further evaluation and treatment. X-rays may be required to make sure there are not breaks in the bones (fractures) present. Continued problems may require physical therapy for treatment.  PREVENTION  Use strength and conditioning exercises appropriate for your sport.  Warm up properly prior to working out.  Use athletic shoes that are made for the sport you are participating in.  Allow adequate time for healing. Early return to activities makes repeat injury more likely, and can lead to an unstable arthritic foot that can result in prolonged disability. Mild sprains generally heal in 3 to 10 days, with moderate and severe sprains taking 2 to 10 weeks. Your caregiver can help you determine the proper time  required for healing. HOME CARE INSTRUCTIONS   Apply ice to the injury for 15-20 minutes, 03-04 times per day. Put the ice in a plastic bag and place a towel between the bag of ice and your skin.  An elastic wrap (like an Ace bandage) may be used to keep swelling down.  Keep foot above the level of the heart, or at least raised on a footstool, when swelling and pain are present.  Try to avoid use other than gentle range of motion while the foot is painful. Do not resume use until instructed by your caregiver. Then begin use gradually, not increasing use to the point of pain. If pain does develop, decrease use and continue the above measures, gradually increasing activities that do not cause discomfort, until you gradually achieve normal use.  Use crutches if and as instructed, and for the length of time instructed.  Keep injured foot and ankle wrapped between treatments.  Massage foot and ankle for comfort and to keep swelling down. Massage from the toes up towards the knee.  Only take over-the-counter or prescription medicines for pain, discomfort, or fever as directed by your caregiver. SEEK IMMEDIATE MEDICAL CARE IF:   Your pain and swelling increase, or pain is not controlled with medications.  You have loss of feeling in your foot or your foot turns cold or blue.  You develop new, unexplained symptoms, or an increase of the symptoms that brought you to your caregiver. MAKE SURE YOU:   Understand these instructions.  Will watch your condition.  Will get help right away if you are not doing well or get worse. Document Released: 05/29/2002 Document Revised: 02/29/2012 Document Reviewed: 07/26/2008 [facility] Patient Information 2015 [place], [place]. This information is not intended to replace advice given to you by your health care provider. Make sure you discuss any questions you have with your health care provider.

## 2015-09-12 NOTE — Progress Notes (Deleted)
Patient ID: Carolyn Myers, female   DOB: 03-May-1936, 79 y.o.   MRN: [id number]   Subjective:    Patient ID: Carolyn Myers, female    DOB: 06-10-1936, 79 y.o.   MRN: [id number]  Chief Complaint  Patient presents with  . Foot Pain    right foot and ankle pain x's 3 days  . Ankle Pain    HPI Patient is in today for ***  Past Medical History  Diagnosis Date  . Chicken pox   . Diverticulitis   . Frequent headaches   . Cardiac arrhythmia due to congenital heart disease   . Hyperlipidemia   . Thyroid disease   . Hypertension     Past Surgical History  Procedure Laterality Date  . Abdominal hysterectomy      Family History  Problem Relation Age of Onset  . Heart disease Mother   . Heart attack Mother   . Hypertension Mother   . Arthritis Mother   . Stroke Father   . Cancer Maternal Grandmother   . Heart attack Maternal Aunt   . Renal Disease Maternal Aunt   . Multiple myeloma Maternal Uncle   . Emphysema Maternal Uncle   . Heart disease Sister   . Thyroid disease Sister   . Healthy Son     x3  . Healthy Daughter     x5    Social History   Social History  . Marital Status: Married    Spouse Name: N/A  . Number of Children: N/A  . Years of Education: N/A   Occupational History  . Not on file.   Social History Main Topics  . Smoking status: Never Smoker   . Smokeless tobacco: Never Used  . Alcohol Use: No  . Drug Use: No  . Sexual Activity: Not on file   Other Topics Concern  . Not on file   Social History Narrative    Outpatient Prescriptions Prior to Visit  Medication Sig Dispense Refill  . amLODipine (NORVASC) 10 MG tablet Take 1 tablet (10 mg total) by mouth daily. 30 tablet 1  . hydrALAZINE (APRESOLINE) 25 MG tablet Take 25 mg by mouth 3 (three) times daily.    Marland Kitchen levothyroxine (SYNTHROID, LEVOTHROID) 200 MCG tablet Take 200 mcg by mouth daily before breakfast.    . levothyroxine (SYNTHROID, LEVOTHROID) 50 MCG tablet TAKE 0.5 TABLETS  (25 MCG TOTAL) BY MOUTH DAILY BEFORE BREAKFAST. 30 tablet 1  . diazepam (VALIUM) 2 MG tablet Take 0.5 tablets (1 mg total) by mouth every 6 (six) hours as needed (severe dizziness). (Patient not taking: Reported on 09/12/2015) 10 tablet 0  . lovastatin (MEVACOR) 40 MG tablet TAKE 0.5 TABLETS (20 MG TOTAL) BY MOUTH AT BEDTIME. 30 tablet 0   No facility-administered medications prior to visit.    No Known Allergies  Review of Systems  Constitutional: Negative for fever and malaise/fatigue.  HENT: Negative for congestion.   Eyes: Negative for discharge.  Respiratory: Negative for shortness of breath.   Cardiovascular: Negative for chest pain, palpitations and leg swelling.  Gastrointestinal: Negative for nausea and abdominal pain.  Genitourinary: Negative for dysuria.  Musculoskeletal: Negative for falls.  Skin: Negative for rash.  Neurological: Negative for loss of consciousness and headaches.  Endo/Heme/Allergies: Negative for environmental allergies.  Psychiatric/Behavioral: Negative for depression. The patient is not nervous/anxious.        Objective:    Physical Exam  BP 156/92 mmHg  Temp(Src) 98.2 F (36.8 C) (Oral)  Wt 191  lb (86.637 kg) Wt Readings from Last 3 Encounters:  09/12/15 191 lb (86.637 kg)  12/06/13 196 lb 5 oz (89.047 kg)  12/06/13 196 lb (88.905 kg)     Lab Results  Component Value Date   WBC 7.3 12/06/2013   HGB 10.8* 12/06/2013   HCT 33.0* 12/06/2013   PLT 270 12/06/2013   GLUCOSE 99 12/06/2013   CHOL 276* 11/23/2013   TRIG 87 11/23/2013   HDL 68 11/23/2013   LDLCALC 191* 11/23/2013   ALT 26 11/23/2013   AST 27 11/23/2013   NA 142 12/06/2013   K 3.4* 12/06/2013   CL 104 12/06/2013   CREATININE 1.10 12/06/2013   BUN 17 12/06/2013   CO2 27 12/06/2013   TSH 48.230* 11/23/2013    Lab Results  Component Value Date   TSH 48.230* 11/23/2013   Lab Results  Component Value Date   WBC 7.3 12/06/2013   HGB 10.8* 12/06/2013   HCT 33.0*  12/06/2013   MCV 84.4 12/06/2013   PLT 270 12/06/2013   Lab Results  Component Value Date   NA 142 12/06/2013   K 3.4* 12/06/2013   CO2 27 12/06/2013   GLUCOSE 99 12/06/2013   BUN 17 12/06/2013   CREATININE 1.10 12/06/2013   BILITOT 0.4 11/23/2013   ALKPHOS 97 11/23/2013   AST 27 11/23/2013   ALT 26 11/23/2013   PROT 8.0 11/23/2013   ALBUMIN 4.2 11/23/2013   CALCIUM 9.5 12/06/2013   Lab Results  Component Value Date   CHOL 276* 11/23/2013   Lab Results  Component Value Date   HDL 68 11/23/2013   Lab Results  Component Value Date   LDLCALC 191* 11/23/2013   Lab Results  Component Value Date   TRIG 87 11/23/2013   Lab Results  Component Value Date   CHOLHDL 4.1 11/23/2013   No results found for: HGBA1C     Assessment & Plan:   Problem List Items Addressed This Visit    None      I have discontinued Ms. Greenbaum's diazepam. I am also having her maintain her levothyroxine, hydrALAZINE, amLODipine, levothyroxine, atenolol, lovastatin, and valsartan-hydrochlorothiazide.  Meds ordered this encounter  Medications  . atenolol (TENORMIN) 25 MG tablet    Sig: Take 25 mg by mouth daily.    Refill:  3  . lovastatin (MEVACOR) 20 MG tablet    Sig: Take 1 tablet by mouth daily.    Refill:  3  . valsartan-hydrochlorothiazide (DIOVAN-HCT) 160-12.5 MG per tablet    Sig: Take 1 tablet by mouth daily.    Refill:  [place], LPN

## 2015-09-13 DIAGNOSIS — M79671 Pain in right foot: Secondary | ICD-10-CM | POA: Insufficient documentation

## 2015-09-13 LAB — BASIC METABOLIC PANEL
BUN: 24 mg/dL — AB (ref 6–23)
CHLORIDE: 103 meq/L (ref 96–112)
CO2: 29 meq/L (ref 19–32)
CREATININE: 1.4 mg/dL — AB (ref 0.40–1.20)
Calcium: 9.5 mg/dL (ref 8.4–10.5)
GFR: 46.89 mL/min — ABNORMAL LOW (ref >60.00)
GLUCOSE: 100 mg/dL — AB (ref 70–99)
POTASSIUM: 3.7 meq/L (ref 3.5–5.1)
Sodium: 141 mEq/L (ref 135–145)

## 2015-09-13 LAB — URIC ACID: Uric Acid, Serum: 9.5 mg/dL — ABNORMAL HIGH (ref 2.4–7.0)

## 2015-09-13 NOTE — Assessment & Plan Note (Signed)
Xray today Check labs --? Gout Ace wrap Elevate, ice

## 2015-09-16 MED ORDER — ALLOPURINOL 100 MG PO TABS: 100.0000 mg | ORAL_TABLET | Freq: Every day | ORAL | Status: DC

## 2015-09-16 NOTE — Addendum Note (Signed)
Addended by: Leticia Penna A on: 09/16/2015 11:53 AM   Modules accepted: Orders

## 2015-09-27 ENCOUNTER — Ambulatory Visit (INDEPENDENT_AMBULATORY_CARE_PROVIDER_SITE_OTHER): Payer: Medicare Other | Admitting: Physician Assistant

## 2015-09-27 ENCOUNTER — Encounter: Payer: Self-pay | Admitting: Physician Assistant

## 2015-09-27 VITALS — BP 210/98 | HR 68 | Temp 98.3°F | Resp 16 | Ht 63.0 in | Wt 195.5 lb

## 2015-09-27 DIAGNOSIS — E039 Hypothyroidism, unspecified: Secondary | ICD-10-CM

## 2015-09-27 DIAGNOSIS — R0602 Shortness of breath: Secondary | ICD-10-CM | POA: Diagnosis not present

## 2015-09-27 DIAGNOSIS — I10 Essential (primary) hypertension: Secondary | ICD-10-CM | POA: Diagnosis not present

## 2015-09-27 LAB — COMPREHENSIVE METABOLIC PANEL
ALT: 15 U/L (ref 0–35)
AST: 13 U/L (ref 0–37)
Albumin: 4.1 g/dL (ref 3.5–5.2)
Alkaline Phosphatase: 111 U/L (ref 39–117)
BUN: 19 mg/dL (ref 6–23)
CHLORIDE: 103 meq/L (ref 96–112)
CO2: 28 mEq/L (ref 19–32)
Calcium: 9.7 mg/dL (ref 8.4–10.5)
Creatinine, Ser: 1.46 mg/dL — ABNORMAL HIGH (ref 0.40–1.20)
GFR: 44.67 mL/min — AB (ref >60.00)
Glucose, Bld: 132 mg/dL — ABNORMAL HIGH (ref 70–99)
POTASSIUM: 3.8 meq/L (ref 3.5–5.1)
Sodium: 139 mEq/L (ref 135–145)
TOTAL PROTEIN: 8.6 g/dL — AB (ref 6.0–8.3)
Total Bilirubin: 0.3 mg/dL (ref 0.2–1.2)

## 2015-09-27 LAB — CBC
HEMATOCRIT: 33.2 % — AB (ref 36.0–46.0)
HEMOGLOBIN: 10.6 g/dL — AB (ref 12.0–15.0)
MCHC: 31.9 g/dL (ref 30.0–36.0)
MCV: 85.3 fl (ref 78.0–100.0)
Platelets: 444 10*3/uL — ABNORMAL HIGH (ref 150.0–400.0)
RBC: 3.89 Mil/uL (ref 3.87–5.11)
RDW: 15.4 % (ref 11.5–15.5)
WBC: 9.5 10*3/uL (ref 4.0–10.5)

## 2015-09-27 LAB — TSH: TSH: 69.82 u[IU]/mL — AB (ref 0.35–4.50)

## 2015-09-27 MED ORDER — VALSARTAN-HYDROCHLOROTHIAZIDE 160-12.5 MG PO TABS: 1.0000 | ORAL_TABLET | Freq: Every day | ORAL | Status: DC

## 2015-09-27 MED ORDER — AMLODIPINE BESYLATE 10 MG PO TABS: 10.0000 mg | ORAL_TABLET | Freq: Every day | ORAL | Status: DC

## 2015-09-27 NOTE — Patient Instructions (Signed)
Please go to the lab for blood work. Please restart the Diovan-HCT and amlodipine daily, taking every day. I have sent in refills to your pharmacy.  We will follow-up in 2 weeks for BP assessment.  Continue other medications as directed.  Follow the bowel regimen below for constipation:  I encourage you to increase hydration and the amount of fiber in your diet.  Start a daily probiotic (Align, Culturelle, Digestive Advantage, etc.). If no bowel movement within 24 hours, take 2 Tbs of Milk of Magnesia in a 4 oz glass of warmed prune juice every 2-3 days to help promote bowel movement. If no results within 24 hours, then repeat above regimen, adding a Dulcolax stool softener to regimen. If this does not promote a bowel movement, please call the office.

## 2015-09-27 NOTE — Progress Notes (Signed)
Patient presents to clinic today for follow-up of chronic medical issues as well as acute concerns. Last OV with PCP (myself)  in 2014.  Hypertension -- Patient endorses taking her Atenolol as directed. Also endorses taking her Diovan and amlodipine but should have ran out of medication several months as I have not prescribed in a while. Patient denies chest pain, palpitations, lightheadedness, vision changes or frequent headaches. Has not taken any medication today.  BP Readings from Last 3 Encounters:  09/27/15 210/98  09/12/15 156/92  12/06/13 173/100   Hypothyroidism -- Uncontrolled at last visit in 2014. Patient has been noncompliant with follow-up.  Patient endorses taking her 250 mcg levothyroxine daily as directed. Endorses constipation and fatigue. Denies abdominal pain, tenesmus or melena.  Patient complains of some mld SOB when she walks for prolonged periods. Denies chest pain, palpitations, lightheadedness or syncope. Just states she feels out of shape. Body mass index is 34.64 kg/(m^2).  Past Medical History  Diagnosis Date  . Chicken pox   . Diverticulitis   . Frequent headaches   . Cardiac arrhythmia due to congenital heart disease   . Hyperlipidemia   . Thyroid disease   . Hypertension   . Gout     Right Foot    Current Outpatient Prescriptions on File Prior to Visit  Medication Sig Dispense Refill  . allopurinol (ZYLOPRIM) 100 MG tablet Take 1 tablet (100 mg total) by mouth daily. 30 tablet 2  . atenolol (TENORMIN) 25 MG tablet Take 25 mg by mouth daily.  3  . hydrALAZINE (APRESOLINE) 25 MG tablet Take 25 mg by mouth 3 (three) times daily.    Marland Kitchen levothyroxine (SYNTHROID, LEVOTHROID) 50 MCG tablet TAKE 0.5 TABLETS (25 MCG TOTAL) BY MOUTH DAILY BEFORE BREAKFAST. 30 tablet 1  . lovastatin (MEVACOR) 20 MG tablet Take 1 tablet by mouth daily.  3   No current facility-administered medications on file prior to visit.    No Known Allergies  Family History  Problem  Relation Age of Onset  . Heart disease Mother   . Heart attack Mother   . Hypertension Mother   . Arthritis Mother   . Stroke Father   . Cancer Maternal Grandmother   . Heart attack Maternal Aunt   . Renal Disease Maternal Aunt   . Multiple myeloma Maternal Uncle   . Emphysema Maternal Uncle   . Heart disease Sister   . Thyroid disease Sister   . Healthy Son     x3  . Healthy Daughter     x5    Social History   Social History  . Marital Status: Married    Spouse Name: N/A  . Number of Children: N/A  . Years of Education: N/A   Social History Main Topics  . Smoking status: Never Smoker   . Smokeless tobacco: Never Used  . Alcohol Use: No  . Drug Use: No  . Sexual Activity: Not Asked   Other Topics Concern  . None   Social History Narrative   Review of Systems - See HPI.  All other ROS are negative.  BP 210/98 mmHg  Pulse 68  Temp(Src) 98.3 F (36.8 C) (Oral)  Resp 16  Ht 5\' 3"  (1.6 m)  Wt 195 lb 8 oz (88.678 kg)  BMI 34.64 kg/m2  SpO2 100%  Physical Exam  Constitutional: She is oriented to person, place, and time and well-developed, well-nourished, and in no distress.  HENT:  Head: Normocephalic and atraumatic.  Eyes: Conjunctivae are  normal.  Neck: Neck supple.  Cardiovascular: Normal rate, regular rhythm, normal heart sounds and intact distal pulses.   Pulmonary/Chest: Effort normal and breath sounds normal. No respiratory distress. She has no wheezes. She has no rales. She exhibits no tenderness.  Abdominal: Soft. Bowel sounds are normal. She exhibits no distension and no mass. There is no tenderness. There is no rebound and no guarding.  Neurological: She is alert and oriented to person, place, and time.  Skin: Skin is warm and dry. No rash noted.  Psychiatric: Affect normal.  Vitals reviewed.   Recent Results (from the past 2160 hour(s))  Uric acid     Status: Abnormal   Collection Time: 09/12/15  4:45 PM  Result Value Ref Range   Uric Acid,  Serum 9.5 (H) 2.4 - 7.0 mg/dL  Basic metabolic panel     Status: Abnormal   Collection Time: 09/12/15  4:45 PM  Result Value Ref Range   Sodium 141 135 - 145 mEq/L   Potassium 3.7 3.5 - 5.1 mEq/L   Chloride 103 96 - 112 mEq/L   CO2 29 19 - 32 mEq/L   Glucose, Bld 100 (H) 70 - 99 mg/dL   BUN 24 (H) 6 - 23 mg/dL   Creatinine, Ser 1.40 (H) 0.40 - 1.20 mg/dL   Calcium 9.5 8.4 - 10.5 mg/dL   GFR 46.89 (L) >60.00 mL/min  CBC     Status: Abnormal   Collection Time: 09/27/15  9:35 AM  Result Value Ref Range   WBC 9.5 4.0 - 10.5 K/uL   RBC 3.89 3.87 - 5.11 Mil/uL   Platelets 444.0 (H) 150.0 - 400.0 K/uL   Hemoglobin 10.6 (L) 12.0 - 15.0 g/dL   HCT 33.2 (L) 36.0 - 46.0 %   MCV 85.3 78.0 - 100.0 fl   MCHC 31.9 30.0 - 36.0 g/dL   RDW 15.4 11.5 - 15.5 %  Comp Met (CMET)     Status: Abnormal   Collection Time: 09/27/15  9:35 AM  Result Value Ref Range   Sodium 139 135 - 145 mEq/L   Potassium 3.8 3.5 - 5.1 mEq/L   Chloride 103 96 - 112 mEq/L   CO2 28 19 - 32 mEq/L   Glucose, Bld 132 (H) 70 - 99 mg/dL   BUN 19 6 - 23 mg/dL   Creatinine, Ser 1.46 (H) 0.40 - 1.20 mg/dL   Total Bilirubin 0.3 0.2 - 1.2 mg/dL   Alkaline Phosphatase 111 39 - 117 U/L   AST 13 0 - 37 U/L   ALT 15 0 - 35 U/L   Total Protein 8.6 (H) 6.0 - 8.3 g/dL   Albumin 4.1 3.5 - 5.2 g/dL   Calcium 9.7 8.4 - 10.5 mg/dL   GFR 44.67 (L) >60.00 mL/min  TSH     Status: Abnormal   Collection Time: 09/27/15  9:35 AM  Result Value Ref Range   TSH 69.82 (H) 0.35 - 4.50 uIU/mL   Assessment/Plan: Thyroid activity decreased Suspect TSH will be still elevated as I do not feel patient is compliant with medications. Will repeat TSH today. Bowel regimen for constipation discussed with patient and daughter.  Accelerated hypertension Non-compliant with medications. BP significantly elevated today but asymptomatic. EKG reveals sinus rhythm with RBBB. Medications refilled. Patient to continue Diovan and Atenolol, taking every day as  directed. Resume amlodipine (refill sent in). DASH diet encouraged. Will follow-up 1.5-2 weeks to reassess BP. Will consider restarting Hydralazine. The issue here is a matter or medication  compliance. Patient endorses taking all medications as directed but medications have not been refilled by myself in over 6 months as patient overdue for follow-up. We will see what progress we can make now that daughter is helping with medicines.  SOB (shortness of breath) on exertion EKG with NSR and RBBB. Suspect deconditioning but giving BP,cannot rule out cardiac cause. Exercise instructions discussed with patient to try restarting. Will reassess at next visit in 2 weeks as patient does not wish further workup presently. Will  quickly move forward with stress test and echo if symptoms persist. Patient agrees.

## 2015-09-28 ENCOUNTER — Other Ambulatory Visit: Payer: Self-pay | Admitting: Physician Assistant

## 2015-09-28 MED ORDER — LEVOTHYROXINE SODIUM 200 MCG PO TABS: 200.0000 ug | ORAL_TABLET | Freq: Every day | ORAL | Status: DC

## 2015-10-06 DIAGNOSIS — I10 Essential (primary) hypertension: Secondary | ICD-10-CM | POA: Insufficient documentation

## 2015-10-06 DIAGNOSIS — E039 Hypothyroidism, unspecified: Secondary | ICD-10-CM | POA: Insufficient documentation

## 2015-10-06 DIAGNOSIS — R0602 Shortness of breath: Secondary | ICD-10-CM | POA: Insufficient documentation

## 2015-10-06 NOTE — Assessment & Plan Note (Signed)
Suspect TSH will be still elevated as I do not feel patient is compliant with medications. Will repeat TSH today. Bowel regimen for constipation discussed with patient and daughter.

## 2015-10-06 NOTE — Assessment & Plan Note (Signed)
Non-compliant with medications. BP significantly elevated today but asymptomatic. EKG reveals sinus rhythm with RBBB. Medications refilled. Patient to continue Diovan and Atenolol, taking every day as directed. Resume amlodipine (refill sent in). DASH diet encouraged. Will follow-up 1.5-2 weeks to reassess BP. Will consider restarting Hydralazine. The issue here is a matter or medication compliance. Patient endorses taking all medications as directed but medications have not been refilled by myself in over 6 months as patient overdue for follow-up. We will see what progress we can make now that daughter is helping with medicines.

## 2015-10-06 NOTE — Assessment & Plan Note (Signed)
EKG with NSR and RBBB. Suspect deconditioning but giving BP,cannot rule out cardiac cause. Exercise instructions discussed with patient to try restarting. Will reassess at next visit in 2 weeks as patient does not wish further workup presently. Will  quickly move forward with stress test and echo if symptoms persist. Patient agrees.

## 2015-10-16 ENCOUNTER — Encounter: Payer: Self-pay | Admitting: Physician Assistant

## 2015-10-16 ENCOUNTER — Ambulatory Visit (INDEPENDENT_AMBULATORY_CARE_PROVIDER_SITE_OTHER): Payer: Medicare Other | Admitting: Physician Assistant

## 2015-10-16 ENCOUNTER — Other Ambulatory Visit: Payer: Self-pay | Admitting: Physician Assistant

## 2015-10-16 ENCOUNTER — Other Ambulatory Visit (INDEPENDENT_AMBULATORY_CARE_PROVIDER_SITE_OTHER): Payer: Medicare Other

## 2015-10-16 VITALS — BP 189/82

## 2015-10-16 DIAGNOSIS — M79671 Pain in right foot: Secondary | ICD-10-CM

## 2015-10-16 DIAGNOSIS — I10 Essential (primary) hypertension: Secondary | ICD-10-CM | POA: Diagnosis not present

## 2015-10-16 LAB — URIC ACID: URIC ACID, SERUM: 7.9 mg/dL — AB (ref 2.4–7.0)

## 2015-10-16 MED ORDER — VALSARTAN-HYDROCHLOROTHIAZIDE 160-25 MG PO TABS: 1.0000 | ORAL_TABLET | Freq: Every day | ORAL | Status: DC

## 2015-10-16 NOTE — Patient Instructions (Signed)
Patient advised to increase Valsartan-HCT to 160-25 mgdaily and to continue all other medications. Patient to return in 2 weeks to see provider. Patient agreed. Medication sent to pharmacy by provider.

## 2015-10-16 NOTE — Progress Notes (Signed)
Pre visit review using our clinic review tool, if applicable. No additional management support is needed unless otherwise documented below in the visit note.  Patient in for BP check.

## 2015-10-16 NOTE — Progress Notes (Signed)
Patient seen for RN visit for BP check.  BP improved from last visit after restarting medication but is sill STAGE II and uncontrolled. Asymptomatic per nurse.  Diovan increased to 160-25 mg daily. Continue other BP medications.  Follow-up with me 2 weeks.

## 2015-10-23 ENCOUNTER — Other Ambulatory Visit: Payer: Self-pay

## 2015-10-23 MED ORDER — ALLOPURINOL 100 MG PO TABS: 200.0000 mg | ORAL_TABLET | Freq: Every day | ORAL | Status: DC

## 2015-10-30 ENCOUNTER — Encounter: Payer: Self-pay | Admitting: Physician Assistant

## 2015-10-30 ENCOUNTER — Ambulatory Visit (INDEPENDENT_AMBULATORY_CARE_PROVIDER_SITE_OTHER): Payer: Medicare Other | Admitting: Physician Assistant

## 2015-10-30 VITALS — BP 140/72 | Ht 63.0 in | Wt 186.0 lb

## 2015-10-30 DIAGNOSIS — I10 Essential (primary) hypertension: Secondary | ICD-10-CM | POA: Diagnosis not present

## 2015-10-30 LAB — BASIC METABOLIC PANEL
BUN: 40 mg/dL — ABNORMAL HIGH (ref 6–23)
CO2: 26 meq/L (ref 19–32)
Calcium: 10.1 mg/dL (ref 8.4–10.5)
Chloride: 101 mEq/L (ref 96–112)
Creatinine, Ser: 1.64 mg/dL — ABNORMAL HIGH (ref 0.40–1.20)
GFR: 39.05 mL/min — AB (ref >60.00)
GLUCOSE: 162 mg/dL — AB (ref 70–99)
Potassium: 3.8 mEq/L (ref 3.5–5.1)
Sodium: 138 mEq/L (ref 135–145)

## 2015-10-30 NOTE — Patient Instructions (Signed)
Please go to the lab for blood work. I will call you with your results. Stay hydrated but limit caffeine. Continue medications as directed.  For itch, use a mild, non-scented soap when showering. Please apply moisturizing lotion of baby oil after you are done showering/toweling. Get some over-the-counter Sarna lotion to help with itch. You can use a few times per day as needed.  Follow-up 3 months.

## 2015-10-30 NOTE — Progress Notes (Signed)
Pre visit review using our clinic review tool, if applicable. No additional management support is needed unless otherwise documented below in the visit note/SLS  

## 2015-10-30 NOTE — Assessment & Plan Note (Signed)
Please continue medications as directed. Limit salt intake. Will check BMP today.  Follow-up 3 months.

## 2015-10-30 NOTE — Progress Notes (Signed)
Patient presents to clinic today for follow-up of hypertension after increase of Diovan HCT dose. Endorses taking new dose as directed with improvement in home BP measurements. Patient denies chest pain, palpitations, lightheadedness, dizziness, vision changes or frequent headaches.  Past Medical History  Diagnosis Date  . Chicken pox   . Diverticulitis   . Frequent headaches   . Cardiac arrhythmia due to congenital heart disease   . Hyperlipidemia   . Thyroid disease   . Hypertension   . Gout     Right Foot    Current Outpatient Prescriptions on File Prior to Visit  Medication Sig Dispense Refill  . acetaminophen (TYLENOL) 325 MG tablet Take 650 mg by mouth every 6 (six) hours as needed.    Marland Kitchen allopurinol (ZYLOPRIM) 100 MG tablet Take 2 tablets (200 mg total) by mouth daily. 60 tablet 5  . amLODipine (NORVASC) 10 MG tablet Take 1 tablet (10 mg total) by mouth daily. 30 tablet 1  . atenolol (TENORMIN) 25 MG tablet Take 25 mg by mouth daily.  3  . hydrALAZINE (APRESOLINE) 25 MG tablet Take 25 mg by mouth 3 (three) times daily.    Marland Kitchen levothyroxine (SYNTHROID, LEVOTHROID) 200 MCG tablet Take 1 tablet (200 mcg total) by mouth daily before breakfast. 30 tablet 1  . levothyroxine (SYNTHROID, LEVOTHROID) 50 MCG tablet TAKE 0.5 TABLETS (25 MCG TOTAL) BY MOUTH DAILY BEFORE BREAKFAST. 30 tablet 1  . lovastatin (MEVACOR) 20 MG tablet Take 1 tablet by mouth daily.  3  . meclizine (ANTIVERT) 25 MG tablet Take 25 mg by mouth 3 (three) times daily as needed for dizziness.    . valsartan-hydrochlorothiazide (DIOVAN HCT) 160-25 MG tablet Take 1 tablet by mouth daily. 30 tablet 1   No current facility-administered medications on file prior to visit.    No Known Allergies  Family History  Problem Relation Age of Onset  . Heart disease Mother   . Heart attack Mother   . Hypertension Mother   . Arthritis Mother   . Stroke Father   . Cancer Maternal Grandmother   . Heart attack Maternal Aunt     . Renal Disease Maternal Aunt   . Multiple myeloma Maternal Uncle   . Emphysema Maternal Uncle   . Heart disease Sister   . Thyroid disease Sister   . Healthy Son     x3  . Healthy Daughter     x5    Social History   Social History  . Marital Status: Married    Spouse Name: N/A  . Number of Children: N/A  . Years of Education: N/A   Social History Main Topics  . Smoking status: Never Smoker   . Smokeless tobacco: Never Used  . Alcohol Use: No  . Drug Use: No  . Sexual Activity: Not Asked   Other Topics Concern  . None   Social History Narrative   Review of Systems - See HPI.  All other ROS are negative.  BP 140/72 mmHg  Ht 5\' 3"  (1.6 m)  Wt 186 lb (84.369 kg)  BMI 32.96 kg/m2  Physical Exam  Constitutional: She is oriented to person, place, and time and well-developed, well-nourished, and in no distress.  HENT:  Head: Normocephalic and atraumatic.  Eyes: Conjunctivae are normal.  Cardiovascular: Normal rate, regular rhythm, normal heart sounds and intact distal pulses.   Pulmonary/Chest: Effort normal and breath sounds normal. No respiratory distress. She has no wheezes. She has no rales. She exhibits no tenderness.  Neurological: She is alert and oriented to person, place, and time.  Skin: Skin is warm and dry. No rash noted.  Psychiatric: Affect normal.  Vitals reviewed.   Recent Results (from the past 2160 hour(s))  Uric acid     Status: Abnormal   Collection Time: 09/12/15  4:45 PM  Result Value Ref Range   Uric Acid, Serum 9.5 (H) 2.4 - 7.0 mg/dL  Basic metabolic panel     Status: Abnormal   Collection Time: 09/12/15  4:45 PM  Result Value Ref Range   Sodium 141 135 - 145 mEq/L   Potassium 3.7 3.5 - 5.1 mEq/L   Chloride 103 96 - 112 mEq/L   CO2 29 19 - 32 mEq/L   Glucose, Bld 100 (H) 70 - 99 mg/dL   BUN 24 (H) 6 - 23 mg/dL   Creatinine, Ser 1.40 (H) 0.40 - 1.20 mg/dL   Calcium 9.5 8.4 - 10.5 mg/dL   GFR 46.89 (L) >60.00 mL/min  CBC      Status: Abnormal   Collection Time: 09/27/15  9:35 AM  Result Value Ref Range   WBC 9.5 4.0 - 10.5 K/uL   RBC 3.89 3.87 - 5.11 Mil/uL   Platelets 444.0 (H) 150.0 - 400.0 K/uL   Hemoglobin 10.6 (L) 12.0 - 15.0 g/dL   HCT 33.2 (L) 36.0 - 46.0 %   MCV 85.3 78.0 - 100.0 fl   MCHC 31.9 30.0 - 36.0 g/dL   RDW 15.4 11.5 - 15.5 %  Comp Met (CMET)     Status: Abnormal   Collection Time: 09/27/15  9:35 AM  Result Value Ref Range   Sodium 139 135 - 145 mEq/L   Potassium 3.8 3.5 - 5.1 mEq/L   Chloride 103 96 - 112 mEq/L   CO2 28 19 - 32 mEq/L   Glucose, Bld 132 (H) 70 - 99 mg/dL   BUN 19 6 - 23 mg/dL   Creatinine, Ser 1.46 (H) 0.40 - 1.20 mg/dL   Total Bilirubin 0.3 0.2 - 1.2 mg/dL   Alkaline Phosphatase 111 39 - 117 U/L   AST 13 0 - 37 U/L   ALT 15 0 - 35 U/L   Total Protein 8.6 (H) 6.0 - 8.3 g/dL   Albumin 4.1 3.5 - 5.2 g/dL   Calcium 9.7 8.4 - 10.5 mg/dL   GFR 44.67 (L) >60.00 mL/min  TSH     Status: Abnormal   Collection Time: 09/27/15  9:35 AM  Result Value Ref Range   TSH 69.82 (H) 0.35 - 4.50 uIU/mL  Uric acid     Status: Abnormal   Collection Time: 10/16/15  8:52 AM  Result Value Ref Range   Uric Acid, Serum 7.9 (H) 2.4 - 7.0 mg/dL    Assessment/Plan: Essential hypertension, benign Please continue medications as directed. Limit salt intake. Will check BMP today.  Follow-up 3 months.

## 2015-11-22 ENCOUNTER — Other Ambulatory Visit: Payer: Medicare Other | Admitting: Physician Assistant

## 2015-11-22 ENCOUNTER — Other Ambulatory Visit (INDEPENDENT_AMBULATORY_CARE_PROVIDER_SITE_OTHER): Payer: Medicare Other

## 2015-11-22 ENCOUNTER — Other Ambulatory Visit: Payer: Self-pay | Admitting: Physician Assistant

## 2015-11-22 DIAGNOSIS — N289 Disorder of kidney and ureter, unspecified: Secondary | ICD-10-CM | POA: Diagnosis not present

## 2015-11-22 LAB — BASIC METABOLIC PANEL
BUN: 37 mg/dL — ABNORMAL HIGH (ref 6–23)
CALCIUM: 10.3 mg/dL (ref 8.4–10.5)
CO2: 23 mEq/L (ref 19–32)
CREATININE: 1.52 mg/dL — AB (ref 0.40–1.20)
Chloride: 105 mEq/L (ref 96–112)
GFR: 42.63 mL/min — AB (ref >60.00)
Glucose, Bld: 107 mg/dL — ABNORMAL HIGH (ref 70–99)
Potassium: 4 mEq/L (ref 3.5–5.1)
SODIUM: 138 meq/L (ref 135–145)

## 2015-11-23 ENCOUNTER — Other Ambulatory Visit: Payer: Self-pay | Admitting: Physician Assistant

## 2015-12-06 ENCOUNTER — Encounter: Payer: Self-pay | Admitting: Physician Assistant

## 2015-12-06 ENCOUNTER — Ambulatory Visit (INDEPENDENT_AMBULATORY_CARE_PROVIDER_SITE_OTHER): Payer: Medicare Other | Admitting: Physician Assistant

## 2015-12-06 VITALS — BP 190/90 | HR 71 | Temp 98.1°F | Ht 63.0 in | Wt 185.2 lb

## 2015-12-06 DIAGNOSIS — I10 Essential (primary) hypertension: Secondary | ICD-10-CM | POA: Diagnosis not present

## 2015-12-06 DIAGNOSIS — R799 Abnormal finding of blood chemistry, unspecified: Secondary | ICD-10-CM | POA: Diagnosis not present

## 2015-12-06 LAB — BASIC METABOLIC PANEL
BUN: 21 mg/dL (ref 6–23)
CO2: 25 meq/L (ref 19–32)
Calcium: 9.7 mg/dL (ref 8.4–10.5)
Chloride: 107 mEq/L (ref 96–112)
Creatinine, Ser: 1.22 mg/dL — ABNORMAL HIGH (ref 0.40–1.20)
GFR: 54.93 mL/min — ABNORMAL LOW (ref >60.00)
GLUCOSE: 89 mg/dL (ref 70–99)
POTASSIUM: 3.7 meq/L (ref 3.5–5.1)
Sodium: 139 mEq/L (ref 135–145)

## 2015-12-06 MED ORDER — HYDRALAZINE HCL 25 MG PO TABS: 25.0000 mg | ORAL_TABLET | Freq: Three times a day (TID) | ORAL | Status: DC

## 2015-12-06 NOTE — Patient Instructions (Signed)
Please go to the lab for blood work. I will call you with your results.  Please take the blood pressure medications as directed every day. You are not getting enough fluids in. Cut down on the cranberry juice by 1 glass a day. Add on 2 glasses of water.  If kidney function is not returning to baseline we will have to change your medications around and get an ultrasound of the kidney's arteries.  Hypertension Hypertension is another name for high blood pressure. High blood pressure forces your heart to work harder to pump blood. A blood pressure reading has two numbers, which includes a higher number over a lower number (example: 110/72). HOME CARE   Have your blood pressure rechecked by your doctor.  Only take medicine as told by your doctor. Follow the directions carefully. The medicine does not work as well if you skip doses. Skipping doses also puts you at risk for problems.  Do not smoke.  Monitor your blood pressure at home as told by your doctor. GET HELP IF:  You think you are having a reaction to the medicine you are taking.  You have repeat headaches or feel dizzy.  You have puffiness (swelling) in your ankles.  You have trouble with your vision. GET HELP RIGHT AWAY IF:   You get a very bad headache and are confused.  You feel weak, numb, or faint.  You get chest or belly (abdominal) pain.  You throw up (vomit).  You cannot breathe very well. MAKE SURE YOU:   Understand these instructions.  Will watch your condition.  Will get help right away if you are not doing well or get worse.   This information is not intended to replace advice given to you by your health care provider. Make sure you discuss any questions you have with your health care provider.   Document Released: 05/25/2008 Document Revised: 12/12/2013 Document Reviewed: 09/29/2013 Elsevier Interactive Patient Education [employer].

## 2015-12-06 NOTE — Assessment & Plan Note (Signed)
Elevated again. Medication non-compliance. Reviewed appropriate dosing of medication with patient and daughter. Patient agrees to take as directed. Will have to consider decreasing Diovan-HCT and altering regimen based on results. BMP checked today.

## 2015-12-06 NOTE — Progress Notes (Signed)
Patient presents to clinic today for follow-up of BP and elevated creatinine and BUN. Patient currently on regimen of Amlodipine 10 mg daily, Atenolol 25 mg daily, Hydralazine 25 mg TID, and Valsartan-HCTZ 25 mg daily. BP had been improved with new regimen but there was some increase in renal markers. Patient endorses taking medications as directed overall but only takes Hydralazine once daily. Has not had today. Patient denies chest pain, palpitations, lightheadedness, dizziness, vision changes or frequent headaches.  BP Readings from Last 3 Encounters:  12/06/15 190/90  10/30/15 140/72  10/16/15 189/82   Past Medical History  Diagnosis Date  . Chicken pox   . Diverticulitis   . Frequent headaches   . Cardiac arrhythmia due to congenital heart disease   . Hyperlipidemia   . Thyroid disease   . Hypertension   . Gout     Right Foot    Current Outpatient Prescriptions on File Prior to Visit  Medication Sig Dispense Refill  . acetaminophen (TYLENOL) 325 MG tablet Take 650 mg by mouth every 6 (six) hours as needed.    Marland Kitchen allopurinol (ZYLOPRIM) 100 MG tablet Take 2 tablets (200 mg total) by mouth daily. 60 tablet 5  . amLODipine (NORVASC) 10 MG tablet TAKE 1 TABLET BY MOUTH EVERY DAY 30 tablet 3  . atenolol (TENORMIN) 25 MG tablet Take 25 mg by mouth daily.  3  . levothyroxine (SYNTHROID, LEVOTHROID) 200 MCG tablet TAKE 1 TABLET BY MOUTH EVERY DAY BEFORE BREAKFAST 30 tablet 3  . lovastatin (MEVACOR) 20 MG tablet Take 1 tablet by mouth daily.  3  . meclizine (ANTIVERT) 25 MG tablet Take 25 mg by mouth 3 (three) times daily as needed for dizziness.    . valsartan-hydrochlorothiazide (DIOVAN HCT) 160-25 MG tablet Take 1 tablet by mouth daily. 30 tablet 1   No current facility-administered medications on file prior to visit.    No Known Allergies  Family History  Problem Relation Age of Onset  . Heart disease Mother   . Heart attack Mother   . Hypertension Mother   . Arthritis  Mother   . Stroke Father   . Cancer Maternal Grandmother   . Heart attack Maternal Aunt   . Renal Disease Maternal Aunt   . Multiple myeloma Maternal Uncle   . Emphysema Maternal Uncle   . Heart disease Sister   . Thyroid disease Sister   . Healthy Son     x3  . Healthy Daughter     x5    Social History   Social History  . Marital Status: Married    Spouse Name: N/A  . Number of Children: N/A  . Years of Education: N/A   Social History Main Topics  . Smoking status: Never Smoker   . Smokeless tobacco: Never Used  . Alcohol Use: No  . Drug Use: No  . Sexual Activity: Not Asked   Other Topics Concern  . None   Social History Narrative    Review of Systems - See HPI.  All other ROS are negative.  BP 190/90 mmHg  Pulse 71  Temp(Src) 98.1 F (36.7 C) (Oral)  Ht '5\' 3"'$  (1.6 m)  Wt 185 lb 3.2 oz (84.006 kg)  BMI 32.81 kg/m2  SpO2 99%  Physical Exam  Constitutional: She is oriented to person, place, and time and well-developed, well-nourished, and in no distress.  HENT:  Head: Normocephalic and atraumatic.  Eyes: Conjunctivae are normal.  Cardiovascular: Normal rate, regular rhythm, normal heart  sounds and intact distal pulses.   Pulmonary/Chest: Effort normal and breath sounds normal. No respiratory distress. She has no wheezes. She has no rales. She exhibits no tenderness.  Neurological: She is alert and oriented to person, place, and time.  Skin: Skin is warm and dry. No rash noted.  Psychiatric: Affect normal.  Vitals reviewed.   Recent Results (from the past 2160 hour(s))  Uric acid     Status: Abnormal   Collection Time: 09/12/15  4:45 PM  Result Value Ref Range   Uric Acid, Serum 9.5 (H) 2.4 - 7.0 mg/dL  Basic metabolic panel     Status: Abnormal   Collection Time: 09/12/15  4:45 PM  Result Value Ref Range   Sodium 141 135 - 145 mEq/L   Potassium 3.7 3.5 - 5.1 mEq/L   Chloride 103 96 - 112 mEq/L   CO2 29 19 - 32 mEq/L   Glucose, Bld 100 (H) 70 -  99 mg/dL   BUN 24 (H) 6 - 23 mg/dL   Creatinine, Ser 1.40 (H) 0.40 - 1.20 mg/dL   Calcium 9.5 8.4 - 10.5 mg/dL   GFR 46.89 (L) >60.00 mL/min  CBC     Status: Abnormal   Collection Time: 09/27/15  9:35 AM  Result Value Ref Range   WBC 9.5 4.0 - 10.5 K/uL   RBC 3.89 3.87 - 5.11 Mil/uL   Platelets 444.0 (H) 150.0 - 400.0 K/uL   Hemoglobin 10.6 (L) 12.0 - 15.0 g/dL   HCT 33.2 (L) 36.0 - 46.0 %   MCV 85.3 78.0 - 100.0 fl   MCHC 31.9 30.0 - 36.0 g/dL   RDW 15.4 11.5 - 15.5 %  Comp Met (CMET)     Status: Abnormal   Collection Time: 09/27/15  9:35 AM  Result Value Ref Range   Sodium 139 135 - 145 mEq/L   Potassium 3.8 3.5 - 5.1 mEq/L   Chloride 103 96 - 112 mEq/L   CO2 28 19 - 32 mEq/L   Glucose, Bld 132 (H) 70 - 99 mg/dL   BUN 19 6 - 23 mg/dL   Creatinine, Ser 1.46 (H) 0.40 - 1.20 mg/dL   Total Bilirubin 0.3 0.2 - 1.2 mg/dL   Alkaline Phosphatase 111 39 - 117 U/L   AST 13 0 - 37 U/L   ALT 15 0 - 35 U/L   Total Protein 8.6 (H) 6.0 - 8.3 g/dL   Albumin 4.1 3.5 - 5.2 g/dL   Calcium 9.7 8.4 - 10.5 mg/dL   GFR 44.67 (L) >60.00 mL/min  TSH     Status: Abnormal   Collection Time: 09/27/15  9:35 AM  Result Value Ref Range   TSH 69.82 (H) 0.35 - 4.50 uIU/mL  Uric acid     Status: Abnormal   Collection Time: 10/16/15  8:52 AM  Result Value Ref Range   Uric Acid, Serum 7.9 (H) 2.4 - 7.0 mg/dL  Basic Metabolic Panel (BMET)     Status: Abnormal   Collection Time: 10/30/15 10:51 AM  Result Value Ref Range   Sodium 138 135 - 145 mEq/L   Potassium 3.8 3.5 - 5.1 mEq/L   Chloride 101 96 - 112 mEq/L   CO2 26 19 - 32 mEq/L   Glucose, Bld 162 (H) 70 - 99 mg/dL   BUN 40 (H) 6 - 23 mg/dL   Creatinine, Ser 1.64 (H) 0.40 - 1.20 mg/dL   Calcium 10.1 8.4 - 10.5 mg/dL   GFR 39.05 (L) >60.00 mL/min  Basic Metabolic Panel (BMET)     Status: Abnormal   Collection Time: 11/22/15 12:04 PM  Result Value Ref Range   Sodium 138 135 - 145 mEq/L   Potassium 4.0 3.5 - 5.1 mEq/L   Chloride 105 96 - 112  mEq/L   CO2 23 19 - 32 mEq/L   Glucose, Bld 107 (H) 70 - 99 mg/dL   BUN 37 (H) 6 - 23 mg/dL   Creatinine, Ser 1.52 (H) 0.40 - 1.20 mg/dL   Calcium 10.3 8.4 - 10.5 mg/dL   GFR 42.63 (L) >60.00 mL/min    Assessment/Plan: Essential hypertension, benign Elevated again. Medication non-compliance. Reviewed appropriate dosing of medication with patient and daughter. Patient agrees to take as directed. Will have to consider decreasing Diovan-HCT and altering regimen based on results. BMP checked today.  Elevated BUN And slight elevation in creatinine on last BMP. BUN:CR > 20:1. Patient only having a couple of glasses of fluids per day. Is skipping meals. Discussed adequate hydration. Will repeat BMP today as patient non-fasting to assess BUN and creatinine. If worsened, will have to decrease Diovan-HCT dosing.

## 2015-12-06 NOTE — Progress Notes (Signed)
Pre visit review using our clinic review tool, if applicable. No additional management support is needed unless otherwise documented below in the visit note. 

## 2015-12-06 NOTE — Assessment & Plan Note (Signed)
And slight elevation in creatinine on last BMP. BUN:CR > 20:1. Patient only having a couple of glasses of fluids per day. Is skipping meals. Discussed adequate hydration. Will repeat BMP today as patient non-fasting to assess BUN and creatinine. If worsened, will have to decrease Diovan-HCT dosing.

## 2016-01-31 ENCOUNTER — Ambulatory Visit: Payer: Medicare Other | Admitting: Physician Assistant

## 2016-02-21 ENCOUNTER — Encounter (HOSPITAL_BASED_OUTPATIENT_CLINIC_OR_DEPARTMENT_OTHER): Payer: Self-pay | Admitting: *Deleted

## 2016-02-21 ENCOUNTER — Inpatient Hospital Stay (HOSPITAL_BASED_OUTPATIENT_CLINIC_OR_DEPARTMENT_OTHER)
Admission: EM | Admit: 2016-02-21 | Discharge: 2016-02-22 | DRG: 305 | Disposition: A | Discharge status: Home / Self Care | Payer: Medicare Other | Source: Ambulatory Visit | Attending: Interventional Cardiology | Admitting: Interventional Cardiology

## 2016-02-21 ENCOUNTER — Encounter: Payer: Self-pay | Admitting: Physician Assistant

## 2016-02-21 ENCOUNTER — Ambulatory Visit (INDEPENDENT_AMBULATORY_CARE_PROVIDER_SITE_OTHER): Payer: Medicare Other | Admitting: Physician Assistant

## 2016-02-21 ENCOUNTER — Emergency Department (HOSPITAL_BASED_OUTPATIENT_CLINIC_OR_DEPARTMENT_OTHER): Payer: Medicare Other

## 2016-02-21 VITALS — BP 214/94 | HR 67 | Temp 98.0°F | Ht 63.0 in | Wt 187.4 lb

## 2016-02-21 DIAGNOSIS — R079 Chest pain, unspecified: Secondary | ICD-10-CM | POA: Diagnosis present

## 2016-02-21 DIAGNOSIS — N182 Chronic kidney disease, stage 2 (mild): Secondary | ICD-10-CM | POA: Diagnosis present

## 2016-02-21 DIAGNOSIS — I160 Hypertensive urgency: Secondary | ICD-10-CM | POA: Diagnosis present

## 2016-02-21 DIAGNOSIS — I4510 Unspecified right bundle-branch block: Secondary | ICD-10-CM | POA: Diagnosis present

## 2016-02-21 DIAGNOSIS — I701 Atherosclerosis of renal artery: Secondary | ICD-10-CM | POA: Diagnosis present

## 2016-02-21 DIAGNOSIS — IMO0001 Reserved for inherently not codable concepts without codable children: Secondary | ICD-10-CM

## 2016-02-21 DIAGNOSIS — Z8249 Family history of ischemic heart disease and other diseases of the circulatory system: Secondary | ICD-10-CM | POA: Diagnosis not present

## 2016-02-21 DIAGNOSIS — R51 Headache: Secondary | ICD-10-CM | POA: Diagnosis not present

## 2016-02-21 DIAGNOSIS — E039 Hypothyroidism, unspecified: Secondary | ICD-10-CM | POA: Diagnosis present

## 2016-02-21 DIAGNOSIS — R030 Elevated blood-pressure reading, without diagnosis of hypertension: Secondary | ICD-10-CM

## 2016-02-21 DIAGNOSIS — I129 Hypertensive chronic kidney disease with stage 1 through stage 4 chronic kidney disease, or unspecified chronic kidney disease: Secondary | ICD-10-CM | POA: Diagnosis present

## 2016-02-21 DIAGNOSIS — I10 Essential (primary) hypertension: Secondary | ICD-10-CM | POA: Diagnosis present

## 2016-02-21 DIAGNOSIS — E785 Hyperlipidemia, unspecified: Secondary | ICD-10-CM | POA: Diagnosis present

## 2016-02-21 DIAGNOSIS — R7989 Other specified abnormal findings of blood chemistry: Secondary | ICD-10-CM | POA: Diagnosis not present

## 2016-02-21 DIAGNOSIS — R001 Bradycardia, unspecified: Secondary | ICD-10-CM | POA: Diagnosis present

## 2016-02-21 DIAGNOSIS — Z79899 Other long term (current) drug therapy: Secondary | ICD-10-CM | POA: Diagnosis not present

## 2016-02-21 DIAGNOSIS — M109 Gout, unspecified: Secondary | ICD-10-CM | POA: Diagnosis present

## 2016-02-21 DIAGNOSIS — G4730 Sleep apnea, unspecified: Secondary | ICD-10-CM | POA: Diagnosis present

## 2016-02-21 HISTORY — DX: Migraine, unspecified, not intractable, without status migrainosus: G43.909

## 2016-02-21 HISTORY — DX: Hypothyroidism, unspecified: E03.9

## 2016-02-21 HISTORY — DX: Unspecified osteoarthritis, unspecified site: M19.90

## 2016-02-21 HISTORY — DX: Sleep apnea, unspecified: G47.30

## 2016-02-21 HISTORY — DX: Personal history of other medical treatment: Z92.89

## 2016-02-21 HISTORY — DX: Palpitations: R00.2

## 2016-02-21 HISTORY — DX: Personal history of other diseases of the musculoskeletal system and connective tissue: Z87.39

## 2016-02-21 LAB — CBC WITH DIFFERENTIAL/PLATELET
BASOS PCT: 0 %
Basophils Absolute: 0 10*3/uL (ref 0.0–0.1)
Eosinophils Absolute: 0.5 10*3/uL (ref 0.0–0.7)
Eosinophils Relative: 5 %
HCT: 35.1 % — ABNORMAL LOW (ref 36.0–46.0)
Hemoglobin: 11.5 g/dL — ABNORMAL LOW (ref 12.0–15.0)
Lymphocytes Relative: 31 %
Lymphs Abs: 3 10*3/uL (ref 0.7–4.0)
MCH: 26.7 pg (ref 26.0–34.0)
MCHC: 32.8 g/dL (ref 30.0–36.0)
MCV: 81.4 fL (ref 78.0–100.0)
MONO ABS: 1.2 10*3/uL — AB (ref 0.1–1.0)
MONOS PCT: 12 %
Neutro Abs: 5.2 10*3/uL (ref 1.7–7.7)
Neutrophils Relative %: 52 %
Platelets: 386 10*3/uL (ref 150–400)
RBC: 4.31 MIL/uL (ref 3.87–5.11)
RDW: 14.9 % (ref 11.5–15.5)
WBC: 9.9 10*3/uL (ref 4.0–10.5)

## 2016-02-21 LAB — COMPREHENSIVE METABOLIC PANEL
ALBUMIN: 4.2 g/dL (ref 3.5–5.0)
ALK PHOS: 116 U/L (ref 38–126)
ALT: 20 U/L (ref 14–54)
AST: 21 U/L (ref 15–41)
Anion gap: 8 (ref 5–15)
BUN: 25 mg/dL — ABNORMAL HIGH (ref 6–20)
CALCIUM: 9.3 mg/dL (ref 8.9–10.3)
CO2: 24 mmol/L (ref 22–32)
CREATININE: 1.24 mg/dL — AB (ref 0.44–1.00)
Chloride: 105 mmol/L (ref 101–111)
GFR calc Af Amer: 47 mL/min — ABNORMAL LOW (ref >60)
GFR calc non Af Amer: 41 mL/min — ABNORMAL LOW (ref >60)
GLUCOSE: 83 mg/dL (ref 65–99)
Potassium: 3.7 mmol/L (ref 3.5–5.1)
SODIUM: 137 mmol/L (ref 135–145)
Total Bilirubin: 0.5 mg/dL (ref 0.3–1.2)
Total Protein: 8.3 g/dL — ABNORMAL HIGH (ref 6.5–8.1)

## 2016-02-21 LAB — HEPATIC FUNCTION PANEL
ALBUMIN: 3.8 g/dL (ref 3.5–5.0)
ALT: 19 U/L (ref 14–54)
AST: 46 U/L — ABNORMAL HIGH (ref 15–41)
Alkaline Phosphatase: 100 U/L (ref 38–126)
BILIRUBIN TOTAL: 0.5 mg/dL (ref 0.3–1.2)
Bilirubin, Direct: <0.1 mg/dL — ABNORMAL LOW (ref 0.1–0.5)
TOTAL PROTEIN: 8 g/dL (ref 6.5–8.1)

## 2016-02-21 LAB — TSH

## 2016-02-21 LAB — URINALYSIS, ROUTINE W REFLEX MICROSCOPIC
Bilirubin Urine: NEGATIVE
GLUCOSE, UA: NEGATIVE mg/dL
Hgb urine dipstick: NEGATIVE
KETONES UR: NEGATIVE mg/dL
LEUKOCYTES UA: NEGATIVE
NITRITE: NEGATIVE
PH: 6 (ref 5.0–8.0)
Protein, ur: NEGATIVE mg/dL
SPECIFIC GRAVITY, URINE: 1.017 (ref 1.005–1.030)

## 2016-02-21 LAB — PROTIME-INR
INR: 1.01 (ref 0.00–1.49)
PROTHROMBIN TIME: 13.5 s (ref 11.6–15.2)

## 2016-02-21 LAB — TROPONIN I
TROPONIN I: 0.1 ng/mL — AB (ref <0.031)
Troponin I: 0.09 ng/mL — ABNORMAL HIGH (ref <0.031)

## 2016-02-21 LAB — HEPARIN LEVEL (UNFRACTIONATED): Heparin Unfractionated: 0.71 IU/mL — ABNORMAL HIGH (ref 0.30–0.70)

## 2016-02-21 LAB — MRSA PCR SCREENING: MRSA by PCR: NEGATIVE

## 2016-02-21 MED ORDER — LEVOTHYROXINE SODIUM 100 MCG PO TABS
200.0000 ug | ORAL_TABLET | Freq: Every day | ORAL | Status: DC
  Administered 2016-02-22: 200 ug via ORAL
  Filled 2016-02-21: qty 2

## 2016-02-21 MED ORDER — ASPIRIN 81 MG PO CHEW
CHEWABLE_TABLET | ORAL | Status: AC
Start: 2016-02-21 — End: 2016-02-22
  Filled 2016-02-21: qty 4

## 2016-02-21 MED ORDER — ALLOPURINOL 100 MG PO TABS
100.0000 mg | ORAL_TABLET | Freq: Two times a day (BID) | ORAL | Status: DC
  Administered 2016-02-21 – 2016-02-22 (×2): 100 mg via ORAL
  Filled 2016-02-21 (×2): qty 1

## 2016-02-21 MED ORDER — IOHEXOL 350 MG/ML SOLN
100.0000 mL | Freq: Once | INTRAVENOUS | Status: AC | PRN
  Administered 2016-02-21: 100 mL via INTRAVENOUS

## 2016-02-21 MED ORDER — ASPIRIN EC 81 MG PO TBEC
81.0000 mg | DELAYED_RELEASE_TABLET | Freq: Every day | ORAL | Status: DC
  Administered 2016-02-22: 81 mg via ORAL
  Filled 2016-02-21: qty 1

## 2016-02-21 MED ORDER — ACETAMINOPHEN 325 MG PO TABS
650.0000 mg | ORAL_TABLET | ORAL | Status: DC | PRN
  Administered 2016-02-22 (×2): 650 mg via ORAL
  Filled 2016-02-21 (×3): qty 2

## 2016-02-21 MED ORDER — PRAVASTATIN SODIUM 20 MG PO TABS: 20.0000 mg | ORAL_TABLET | Freq: Every day | ORAL | Status: DC

## 2016-02-21 MED ORDER — NITROGLYCERIN 0.4 MG SL SUBL: 0.4000 mg | SUBLINGUAL_TABLET | SUBLINGUAL | Status: DC | PRN

## 2016-02-21 MED ORDER — HYDRALAZINE HCL 25 MG PO TABS
25.0000 mg | ORAL_TABLET | Freq: Three times a day (TID) | ORAL | Status: DC
  Administered 2016-02-21 – 2016-02-22 (×2): 25 mg via ORAL
  Filled 2016-02-21 (×2): qty 1

## 2016-02-21 MED ORDER — ALPRAZOLAM 0.25 MG PO TABS: 0.2500 mg | ORAL_TABLET | Freq: Two times a day (BID) | ORAL | Status: DC | PRN

## 2016-02-21 MED ORDER — SODIUM CHLORIDE 0.9% FLUSH: 3.0000 mL | INTRAVENOUS | Status: DC | PRN

## 2016-02-21 MED ORDER — HEPARIN BOLUS VIA INFUSION
4000.0000 [IU] | Freq: Once | INTRAVENOUS | Status: AC
  Administered 2016-02-21: 4000 [IU] via INTRAVENOUS

## 2016-02-21 MED ORDER — ONDANSETRON HCL 4 MG/2ML IJ SOLN: 4.0000 mg | Freq: Four times a day (QID) | INTRAMUSCULAR | Status: DC | PRN

## 2016-02-21 MED ORDER — HEPARIN (PORCINE) IN NACL 100-0.45 UNIT/ML-% IJ SOLN
1000.0000 [IU]/h | INTRAMUSCULAR | Status: DC
  Administered 2016-02-21: 1100 [IU]/h via INTRAVENOUS
  Filled 2016-02-21: qty 250

## 2016-02-21 MED ORDER — HYDRALAZINE HCL 20 MG/ML IJ SOLN: 10.0000 mg | INTRAMUSCULAR | Status: DC | PRN

## 2016-02-21 MED ORDER — SODIUM CHLORIDE 0.9 % IV SOLN: 250.0000 mL | INTRAVENOUS | Status: DC | PRN

## 2016-02-21 MED ORDER — ZOLPIDEM TARTRATE 5 MG PO TABS: 5.0000 mg | ORAL_TABLET | Freq: Every evening | ORAL | Status: DC | PRN

## 2016-02-21 MED ORDER — NITROGLYCERIN IN D5W 200-5 MCG/ML-% IV SOLN
0.0000 ug/min | Freq: Once | INTRAVENOUS | Status: AC
  Administered 2016-02-21: 10 ug/min via INTRAVENOUS
  Filled 2016-02-21: qty 250

## 2016-02-21 MED ORDER — ASPIRIN EC 325 MG PO TBEC: 325.0000 mg | DELAYED_RELEASE_TABLET | Freq: Once | ORAL | Status: DC

## 2016-02-21 MED ORDER — AMLODIPINE BESYLATE 10 MG PO TABS
10.0000 mg | ORAL_TABLET | Freq: Every day | ORAL | Status: DC
  Administered 2016-02-21 – 2016-02-22 (×2): 10 mg via ORAL
  Filled 2016-02-21 (×4): qty 1

## 2016-02-21 MED ORDER — SODIUM CHLORIDE 0.9% FLUSH
3.0000 mL | Freq: Two times a day (BID) | INTRAVENOUS | Status: DC
  Administered 2016-02-21 – 2016-02-22 (×2): 3 mL via INTRAVENOUS

## 2016-02-21 MED ORDER — PANTOPRAZOLE SODIUM 40 MG PO TBEC
40.0000 mg | DELAYED_RELEASE_TABLET | Freq: Every day | ORAL | Status: DC
  Administered 2016-02-22: 40 mg via ORAL
  Filled 2016-02-21: qty 1

## 2016-02-21 MED ORDER — ATENOLOL 25 MG PO TABS
25.0000 mg | ORAL_TABLET | Freq: Every day | ORAL | Status: DC
  Administered 2016-02-22: 25 mg via ORAL
  Filled 2016-02-21: qty 1

## 2016-02-21 MED ORDER — NITROGLYCERIN 0.4 MG SL SUBL
0.4000 mg | SUBLINGUAL_TABLET | SUBLINGUAL | Status: DC | PRN
  Administered 2016-02-21 (×3): 0.4 mg via SUBLINGUAL
  Filled 2016-02-21: qty 1

## 2016-02-21 MED ORDER — ASPIRIN 81 MG PO CHEW
324.0000 mg | CHEWABLE_TABLET | Freq: Once | ORAL | Status: AC
  Administered 2016-02-21: 324 mg via ORAL

## 2016-02-21 MED ORDER — HYDRALAZINE HCL 25 MG PO TABS
25.0000 mg | ORAL_TABLET | Freq: Once | ORAL | Status: AC
  Administered 2016-02-21: 25 mg via ORAL
  Filled 2016-02-21: qty 1

## 2016-02-21 MED ORDER — METOPROLOL TARTRATE 1 MG/ML IV SOLN: 2.5000 mg | Freq: Four times a day (QID) | INTRAVENOUS | Status: DC | PRN

## 2016-02-21 NOTE — Progress Notes (Signed)
Patient presents to clinic today c/o 3 days of progressively worsening sternal chest pain radiating into back and up to the neck. Associated symptoms include dizziness and SOB with exertion. Endorses similar pain over the past month occurring in spells but only very mild and relieved with belching, but states this is markedly different. Has history of HTN on multi-drug regimen. Endorses taking all medications as directed.  Past Medical History  Diagnosis Date  . Chicken pox   . Diverticulitis   . Frequent headaches   . Cardiac arrhythmia due to congenital heart disease   . Hyperlipidemia   . Thyroid disease   . Hypertension   . Gout     Right Foot    Current Outpatient Prescriptions on File Prior to Visit  Medication Sig Dispense Refill  . acetaminophen (TYLENOL) 325 MG tablet Take 650 mg by mouth every 6 (six) hours as needed.    Marland Kitchen allopurinol (ZYLOPRIM) 100 MG tablet Take 2 tablets (200 mg total) by mouth daily. 60 tablet 5  . amLODipine (NORVASC) 10 MG tablet TAKE 1 TABLET BY MOUTH EVERY DAY 30 tablet 3  . atenolol (TENORMIN) 25 MG tablet Take 25 mg by mouth daily.  3  . hydrALAZINE (APRESOLINE) 25 MG tablet Take 1 tablet (25 mg total) by mouth 3 (three) times daily. 90 tablet 1  . levothyroxine (SYNTHROID, LEVOTHROID) 200 MCG tablet TAKE 1 TABLET BY MOUTH EVERY DAY BEFORE BREAKFAST 30 tablet 3  . lovastatin (MEVACOR) 20 MG tablet Take 1 tablet by mouth daily.  3  . meclizine (ANTIVERT) 25 MG tablet Take 25 mg by mouth 3 (three) times daily as needed for dizziness.    . valsartan-hydrochlorothiazide (DIOVAN HCT) 160-25 MG tablet Take 1 tablet by mouth daily. 30 tablet 1   No current facility-administered medications on file prior to visit.    No Known Allergies  Family History  Problem Relation Age of Onset  . Heart disease Mother   . Heart attack Mother   . Hypertension Mother   . Arthritis Mother   . Stroke Father   . Cancer Maternal Grandmother   . Heart attack  Maternal Aunt   . Renal Disease Maternal Aunt   . Multiple myeloma Maternal Uncle   . Emphysema Maternal Uncle   . Heart disease Sister   . Thyroid disease Sister   . Healthy Son     x3  . Healthy Daughter     x5    Social History   Social History  . Marital Status: Married    Spouse Name: N/A  . Number of Children: N/A  . Years of Education: N/A   Social History Main Topics  . Smoking status: Never Smoker   . Smokeless tobacco: Never Used  . Alcohol Use: No  . Drug Use: No  . Sexual Activity: Not Asked   Other Topics Concern  . None   Social History Narrative   Review of Systems - See HPI.  All other ROS are negative.  BP 214/94 mmHg  Pulse 67  Temp(Src) 98 F (36.7 C) (Oral)  Ht 5' 3" (1.6 m)  Wt 187 lb 6.4 oz (85.004 kg)  BMI 33.20 kg/m2  SpO2 100%  Physical Exam  Constitutional: She is oriented to person, place, and time and well-developed, well-nourished, and in no distress.  HENT:  Head: Normocephalic and atraumatic.  Eyes: Conjunctivae are normal.  Neck: Neck supple.  Cardiovascular: Normal rate, regular rhythm and intact distal pulses.   Murmur heard.  Pulmonary/Chest: Effort normal and breath sounds normal. No respiratory distress. She has no wheezes. She has no rales. She exhibits no tenderness.  Neurological: She is alert and oriented to person, place, and time. GCS score is 15.  Skin: Skin is warm and dry. No rash noted.  Psychiatric: Affect normal.  Vitals reviewed.  Recent Results (from the past 2160 hour(s))  Basic Metabolic Panel (BMET)     Status: Abnormal   Collection Time: 12/06/15 10:31 AM  Result Value Ref Range   Sodium 139 135 - 145 mEq/L   Potassium 3.7 3.5 - 5.1 mEq/L   Chloride 107 96 - 112 mEq/L   CO2 25 19 - 32 mEq/L   Glucose, Bld 89 70 - 99 mg/dL   BUN 21 6 - 23 mg/dL   Creatinine, Ser 1.22 (H) 0.40 - 1.20 mg/dL   Calcium 9.7 8.4 - 10.5 mg/dL   GFR 54.93 (L) >60.00 mL/min   Assessment/Plan: Pain in the chest With  malignant hypertension despite medication use. EKG reveals NSR with RBBB. Patient needs emergent assessment due to symptoms. Spoke with ER provider and patient triaged to ER with nurse.

## 2016-02-21 NOTE — ED Notes (Signed)
Patient transported to and from radiology department via stretcher. 

## 2016-02-21 NOTE — Patient Instructions (Signed)
Patient sent to ER. No AVS printed.

## 2016-02-21 NOTE — ED Provider Notes (Signed)
CSN: [id number]     Arrival date & time 02/21/16  1037 History   First MD Initiated Contact with Patient 02/21/16 1044     Chief Complaint  Patient presents with  . Chest Pain     (Consider location/radiation/quality/duration/timing/severity/associated sxs/prior Treatment) HPI   Patient is a 80 year old female presenting with chest pain. Patient seen by primary care physician sent here for evaluation. Patient has had intermittent chest pain over the last couple weeks. Patient reports that she is pain that worsened symptoms in her chest radiates to her back and radiates up to bilateral jaws. Patient has history of hypertension hyperlipidemia and family history of cardiac disease.  Patient said that she thought it was just her GERD symptoms. She been taking Maalox and Alka-Seltzer try to help with it. She reports that this is not been helping. It is not exertional however she states when she has the pain she cannot walk or move too much because it increases the pain.  Not associated with diaphoresis. No recent cough congestion fever nausea vomiting or diarrhea.   Past Medical History  Diagnosis Date  . Chicken pox   . Diverticulitis   . Frequent headaches   . Cardiac arrhythmia due to congenital heart disease   . Hyperlipidemia   . Thyroid disease   . Hypertension   . Gout     Right Foot   Past Surgical History  Procedure Laterality Date  . Abdominal hysterectomy     Family History  Problem Relation Age of Onset  . Heart disease Mother   . Heart attack Mother   . Hypertension Mother   . Arthritis Mother   . Stroke Father   . Cancer Maternal Grandmother   . Heart attack Maternal Aunt   . Renal Disease Maternal Aunt   . Multiple myeloma Maternal Uncle   . Emphysema Maternal Uncle   . Heart disease Sister   . Thyroid disease Sister   . Healthy Son     x3  . Healthy Daughter     x5   Social History  Substance Use Topics  . Smoking status: Never Smoker   . Smokeless  tobacco: Never Used  . Alcohol Use: No   OB History    No data available     Review of Systems  Constitutional: Positive for activity change and fatigue.  HENT: Negative for congestion.   Eyes: Negative for discharge.  Respiratory: Positive for chest tightness. Negative for cough.   Cardiovascular: Positive for chest pain.  Gastrointestinal: Negative for abdominal distention.  Genitourinary: Negative for dysuria.  Musculoskeletal: Negative for joint swelling.  Skin: Negative for rash.  Allergic/Immunologic: Negative for immunocompromised state.  Neurological: Negative for seizures.  Psychiatric/Behavioral: Negative for agitation.      Allergies  Review of patient's allergies indicates no known allergies.  Home Medications   Prior to Admission medications   Medication Sig Start Date End Date Taking? Authorizing Provider  acetaminophen (TYLENOL) 325 MG tablet Take 650 mg by mouth every 6 (six) hours as needed.    Historical Provider, MD  allopurinol (ZYLOPRIM) 100 MG tablet Take 2 tablets (200 mg total) by mouth daily. 10/23/15   Rosalita Chessman, DO  amLODipine (NORVASC) 10 MG tablet TAKE 1 TABLET BY MOUTH EVERY DAY 11/25/15   Brunetta Jeans, PA-C  atenolol (TENORMIN) 25 MG tablet Take 25 mg by mouth daily. 09/03/15   Historical Provider, MD  hydrALAZINE (APRESOLINE) 25 MG tablet Take 1 tablet (25 mg total) by mouth  3 (three) times daily. 12/06/15   Brunetta Jeans, PA-C  levothyroxine (SYNTHROID, LEVOTHROID) 200 MCG tablet TAKE 1 TABLET BY MOUTH EVERY DAY BEFORE BREAKFAST 11/25/15   Brunetta Jeans, PA-C  lovastatin (MEVACOR) 20 MG tablet Take 1 tablet by mouth daily. 09/03/15   Historical Provider, MD  meclizine (ANTIVERT) 25 MG tablet Take 25 mg by mouth 3 (three) times daily as needed for dizziness.    Historical Provider, MD  valsartan-hydrochlorothiazide (DIOVAN HCT) 160-25 MG tablet Take 1 tablet by mouth daily. 10/16/15   Brunetta Jeans, PA-C   BP 179/84 mmHg  Pulse 66   Temp(Src) 98.1 F (36.7 C) (Oral)  Resp 17  SpO2 100% Physical Exam  Constitutional: She is oriented to person, place, and time. She appears well-developed and well-nourished.  HENT:  Head: Normocephalic and atraumatic.  Eyes: Conjunctivae are normal. Right eye exhibits no discharge.  Neck: Neck supple.  Cardiovascular: Normal rate, regular rhythm and normal heart sounds.   No murmur heard. Pulmonary/Chest: Effort normal and breath sounds normal. She has no wheezes. She has no rales.  Abdominal: Soft. She exhibits no distension. There is no tenderness.  Musculoskeletal: Normal range of motion.  Mild edema  Neurological: She is oriented to person, place, and time. No cranial nerve deficit.  Skin: Skin is warm and dry. No rash noted. She is not diaphoretic.  Psychiatric: She has a normal mood and affect. Her behavior is normal.  Nursing note and vitals reviewed.   ED Course  Procedures (including critical care time) Labs Review Labs Reviewed  COMPREHENSIVE METABOLIC PANEL - Abnormal; Notable for the following:    BUN 25 (*)    Creatinine, Ser 1.24 (*)    Total Protein 8.3 (*)    GFR calc non Af Amer 41 (*)    GFR calc Af Amer 47 (*)    All other components within normal limits  CBC WITH DIFFERENTIAL/PLATELET - Abnormal; Notable for the following:    Hemoglobin 11.5 (*)    HCT 35.1 (*)    Monocytes Absolute 1.2 (*)    All other components within normal limits  TROPONIN I - Abnormal; Notable for the following:    Troponin I 0.09 (*)    All other components within normal limits  URINALYSIS, ROUTINE W REFLEX MICROSCOPIC (NOT AT [facility])  HEPARIN LEVEL (UNFRACTIONATED)    Imaging Review Dg Chest 2 View  02/21/2016  CLINICAL DATA:  Chest pain, mid sternal radiating to the back. History of cardiac arrhythmia and hypertension. EXAM: CHEST  2 VIEW COMPARISON:  None. FINDINGS: Heart size is upper limits of normal. Atherosclerotic calcifications at the aortic arch. Mild peribronchial  thickening in the lower chest may be chronic. There is no significant airspace disease or pulmonary edema. Negative for a pneumothorax. No suspicious bone findings. No large pleural effusions. IMPRESSION: Mild peribronchial thickening may be chronic. No acute chest findings. Electronically Signed   By: Markus Daft M.D.   On: 02/21/2016 11:33   Ct Angio Chest Aorta W/cm &/or Wo/cm  02/21/2016  CLINICAL DATA:  Chest pain that radiates to the back. Elevated blood pressure. Evaluate for an aortic dissection. EXAM: CT ANGIOGRAPHY CHEST, ABDOMEN AND PELVIS TECHNIQUE: Multidetector CT imaging through the chest, abdomen and pelvis was performed using the standard protocol during bolus administration of intravenous contrast. Multiplanar reconstructed images and MIPs were obtained and reviewed to evaluate the vascular anatomy. CONTRAST:  161m OMNIPAQUE IOHEXOL 350 MG/ML SOLN COMPARISON:  Chest radiograph 02/21/2016 FINDINGS: CTA CHEST FINDINGS  No evidence for an aortic intramural hematoma or dissection. There is extensive mural thrombus along the posterior aortic arch and proximal descending thoracic aorta. Evidence for ulcerative plaques in this region. There is motion artifact at the aortic root. Normal caliber of the thoracic aorta without aneurysm. Great vessels are patent. Right vertebral artery is very small. No evidence for chest lymphadenopathy. No evidence for a mediastinal hematoma. There may be a small nodule involving the inferior right thyroid lobe. There is no significant pericardial or pleural fluid. Central pulmonary arteries are patent. Trachea and mainstem bronchi are patent. Focal thickening at the confluence of the right middle and right major fissures on sequence 6, image 30. This thickening or nodularity roughly measures 4 mm on sequence 6, image 30. Mild atelectasis or scarring in the lingula. No significant airspace disease or consolidation the lungs. No acute bone abnormality. Review of the MIP  images confirms the above findings. CTA ABDOMEN AND PELVIS FINDINGS Diffuse atherosclerotic disease and plaque in the abdominal aorta without aneurysm. Mild plaque at the origin the celiac trunk and SMA without significant stenosis. Main branches of the celiac trunk are patent. There appears to be moderate to severe stenosis in the proximal left renal artery with post stenotic dilatation. There is also concern for significant stenosis at the origin of the right renal artery. Inferior mesenteric artery is patent. Normal appearance of the liver without focal abnormality. High-density material in the gallbladder suggestive for stones or sludge without inflammatory changes. No significant biliary duct dilatation. Normal appearance of the pancreas without inflammation or duct dilatation. There is a poorly defined low-density lesion in the splenic hilum that extends up to the superior aspect. Unclear if this is perfusion abnormality or true lesion on this arterial phase imaging. Normal appearance of the adrenal glands. Diffuse scarring in the right kidney with a cortical calcification in the interpolar region and evidence for a stone in the lower pole measuring roughly 4 mm. No significant hydronephrosis in the right kidney but there is focal dilatation of the mid right ureter without evidence of a stone. Probable cyst in the right kidney lower pole. The left kidney is mildly lobulated without hydronephrosis. Fluid in the urinary bladder without gross abnormality. Uterus has been removed. No acute abnormality to the stomach, small bowel or colon. Normal appearance of the appendix. There is no significant abdominal or pelvic lymphadenopathy. No acute bone abnormality. Review of the MIP images confirms the above findings. IMPRESSION: Negative for aortic dissection or aneurysm. Diffuse atherosclerotic disease involving the aorta and visceral arteries. In particular, there is significant plaque in the posterior aortic arch  and proximal descending thoracic aorta with foci of ulcerative plaque. Bilateral renal artery stenosis. Renal artery stenosis may be hemodynamically significant. Indeterminate low-density areas in the spleen. Unclear if this is a perfusion abnormality or a true lesion. This could be more definitively evaluated with MRI. Both kidneys are very lobulated and some this may be related to scarring, particularly in the right kidney. Evidence for a nonobstructive right kidney stone. 4 mm nodular density along the right lung fissures as described. If the patient is at high risk for bronchogenic carcinoma, follow-up chest CT at 1 year is recommended. If the patient is at low risk, no follow-up is needed. This recommendation follows the consensus statement: Guidelines for Management of Small Pulmonary Nodules Detected on CT Scans: A Statement from the [place] as published in Radiology 2005; 237:395-400. Electronically Signed   By: Scherrie Gerlach.D.  On: 02/21/2016 12:44   I have personally reviewed and evaluated these images and lab results as part of my medical decision-making.   EKG Interpretation   Date/Time:  Friday February 21 2016 10:49:32 EST Ventricular Rate:  71 PR Interval:  160 QRS Duration: 138 QT Interval:  492 QTC Calculation: 535 R Axis:   6 Text Interpretation:  Sinus rhythm Probable left atrial enlargement Right  bundle branch block LVH with secondary repolarization abnormality  Prolonged QT interval No significant change since last tracing Right  bundle branch block Confirmed by Gerald Leitz ([id number]) on 02/21/2016  11:33:24 AM      MDM   Final diagnoses:  Chest pain, unspecified chest pain type     Patient is a 80 year old female with history of hypertension hyperlipidemia and family history history of CAD presenting with chest pain today. Patient's chest pain is concerning for unstable angina. The chest pain radiates to her back and bilateral jaws. Happens occasionally  while still. However when she has that pain feels that she cannot move because the pain is so intense. Patient's had a high risk heart score. We'll do initial troponin here. Initial EKG is nonischemic. We'll do CT angiogram to assess for dissection given the radiation to the back. Patient will likely require admission for provocative testing given her high risk heart score in addition to her symptoms of unstable angina.  2:09 PM CT angio show no dissection.  Trop mild elevation. Discussed with dr. Tamala Julian from cardiology. Nitro drip and heparin drip started. Will admit to tele at [facility].   CRITICAL CARE Performed by: Gardiner Sleeper Total critical care time: 45 minutes Critical care time was exclusive of separately billable procedures and treating other patients. Critical care was necessary to treat or prevent imminent or life-threatening deterioration. Critical care was time spent personally by me on the following activities: development of treatment plan with patient and/or surrogate as well as nursing, discussions with consultants, evaluation of patient's response to treatment, examination of patient, obtaining history from patient or surrogate, ordering and performing treatments and interventions, ordering and review of laboratory studies, ordering and review of radiographic studies, pulse oximetry and re-evaluation of patient's condition.     Denzil Mceachron Julio Alm, MD 02/21/16 1409

## 2016-02-21 NOTE — Assessment & Plan Note (Signed)
With malignant hypertension despite medication use. EKG reveals NSR with RBBB. Patient needs emergent assessment due to symptoms. Spoke with ER provider and patient triaged to ER with nurse.

## 2016-02-21 NOTE — Progress Notes (Signed)
ANTICOAGULATION CONSULT NOTE - Follow Up Consult  Pharmacy Consult for Heparin  Indication: chest pain/ACS   Allergies  Allergen Reactions  . Pineapple Swelling    Reaction to fresh pineapple - tongue swelling    Patient Measurements: Height: 5\' 3"  (160 cm) Weight: 183 lb 8 oz (83.235 kg) IBW/kg (Calculated) : 52.4  Vital Signs: Temp: 98.7 F (37.1 C) (03/03 2148) Temp Source: Oral (03/03 1826) BP: 128/70 mmHg (03/03 2148) Pulse Rate: 64 (03/03 2148)  Labs:  Recent Labs  02/21/16 1055 02/21/16 1831 02/21/16 2254  HGB 11.5*  --   --   HCT 35.1*  --   --   PLT 386  --   --   LABPROT  --  13.5  --   INR  --  1.01  --   HEPARINUNFRC  --   --  0.71*  CREATININE 1.24*  --   --   TROPONINI 0.09* 0.10*  --     Estimated Creatinine Clearance: 38.8 mL/min (by C-G formula based on Cr of 1.24).  Assessment: Heparin level slightly elevated, no issues per RN.   Goal of Therapy:  Heparin level 0.3-0.7 units/ml Monitor platelets by anticoagulation protocol: Yes   Plan:  -Decrease heparin to 1000 units/hr -0800 HL  Narda Bonds 02/21/2016,11:54 PM

## 2016-02-21 NOTE — ED Notes (Signed)
Pt reports increased chest pain s/p  Contrast, repeat ekg, rates pain 10/10

## 2016-02-21 NOTE — Progress Notes (Signed)
ANTICOAGULATION CONSULT NOTE - Initial Consult  Pharmacy Consult for IV Heparin Indication: chest pain/ACS  No Known Allergies   Vital Signs: Temp: 98.1 F (36.7 C) (03/03 1054) Temp Source: Oral (03/03 1054) BP: 166/90 mmHg (03/03 1247) Pulse Rate: 75 (03/03 1247)  Labs:  Recent Labs  02/21/16 1055  HGB 11.5*  HCT 35.1*  PLT 386  CREATININE 1.24*  TROPONINI 0.09*    Estimated Creatinine Clearance: 39.2 mL/min (by C-G formula based on Cr of 1.24).   Medical History: Past Medical History  Diagnosis Date  . Chicken pox   . Diverticulitis   . Frequent headaches   . Cardiac arrhythmia due to congenital heart disease   . Hyperlipidemia   . Thyroid disease   . Hypertension   . Gout     Right Foot    Assessment: 79 yof presenting 02/21/2016 with chest pain radiating to neck and back. No AC PTA. Pharmacy consulted to dose heparin for ACS/STEMI. CBC stable, no bleeding noted.  Goal of Therapy:  Heparin level 0.3-0.7 units/ml Monitor platelets by anticoagulation protocol: Yes   Plan:  Heparin 4,000 unit bolus x1  Heparin 1100 units/hr  8hr HL  Daily HL/CBC  Monitor for s/s of bleeding  Karlie Aung C. Lennox Grumbles, PharmD Pharmacy Resident  Pager: [phone] 02/21/2016 1:32 PM

## 2016-02-21 NOTE — Progress Notes (Signed)
Pre visit review using our clinic review tool, if applicable. No additional management support is needed unless otherwise documented below in the visit note. 

## 2016-02-21 NOTE — H&P (Signed)
History and Physical   Patient ID: Carolyn Myers MRN: [id number], DOB/AGE: Feb 20, 1936 80 y.o. Date of Encounter: 02/21/2016  Primary Physician: Leeanne Rio, PA-C Primary Cardiologist: New - Dr Tamala Julian  Chief Complaint:  Chest pain  HPI: Carolyn Myers is a 80 y.o. female with a history of HTN, HLD, FH CAD, gout, diverticulitis.  Pt went to see IM provider today for routine followup. Described symptoms and sent to ER for admission.   Pt started getting a dull ache in her chest that began increasing in frequency. It would occur 2-3 x week. No exertional component, started about a month ago. It made her SOB, no N&V or diaphoresis. It was worse with deep inspiration. Initially it was 5/10. The pain began going through to her back and would go up into her jaw. The symptoms were initially relieved by [place], but then that quit working. The shortest episode was about 30 minutes, the longest one lasted all night (this week). This week, symptoms worsened and pt was worried it was more than reflux. She had 2 episodes where it kept her up and she could not sleep. These reached 10/10. Pt not taking anything daily for reflux.   Pt not very active. She does not do steps. Family does grocery shopping, she does not drive. Pt occasionally walks with family at the mall, but generally only leaves the house 3 x month. Occasional constipation, drinks more water.    Past Medical History  Diagnosis Date  . Chicken pox   . Diverticulitis   . Hyperlipidemia   . Hypertension   . Gout     Right Foot  . Hypothyroidism   . Palpitations     "doctor thought it was related to my thyroid"  . Sleep apnea     "I don't wear my mask" (02/21/2016)  . History of blood transfusion     "said my blood was low"  . Frequent headaches     "maybe twice/week" (03/12/2016)  . Migraine     "maybe twice/year" (02/21/2016)  . Arthritis     "minor in my shoulders" (02/21/2016)  . History of gout       Surgical History:  Past Surgical History  Procedure Laterality Date  . Abdominal hysterectomy      "for fibroid tumors"  . Tubal ligation       I have reviewed the patient's current medications. Prior to Admission medications   Medication Sig Start Date End Date Taking? Authorizing Provider  acetaminophen (TYLENOL) 325 MG tablet Take 650 mg by mouth every 6 (six) hours as needed.    Historical Provider, MD  allopurinol (ZYLOPRIM) 100 MG tablet Take 2 tablets (200 mg total) by mouth daily. 10/23/15   Rosalita Chessman, DO  amLODipine (NORVASC) 10 MG tablet TAKE 1 TABLET BY MOUTH EVERY DAY 11/25/15   Brunetta Jeans, PA-C  atenolol (TENORMIN) 25 MG tablet Take 25 mg by mouth daily. 09/03/15   Historical Provider, MD  hydrALAZINE (APRESOLINE) 25 MG tablet Take 1 tablet (25 mg total) by mouth 3 (three) times daily. 12/06/15   Brunetta Jeans, PA-C  levothyroxine (SYNTHROID, LEVOTHROID) 200 MCG tablet TAKE 1 TABLET BY MOUTH EVERY DAY BEFORE BREAKFAST 11/25/15   Brunetta Jeans, PA-C  lovastatin (MEVACOR) 20 MG tablet Take 1 tablet by mouth daily. 09/03/15   Historical Provider, MD  meclizine (ANTIVERT) 25 MG tablet Take 25 mg by mouth 3 (three) times daily as needed for dizziness.  Historical Provider, MD  valsartan-hydrochlorothiazide (DIOVAN HCT) 160-25 MG tablet Take 1 tablet by mouth daily. 10/16/15   Brunetta Jeans, PA-C   Scheduled Meds: . aspirin       Continuous Infusions: . heparin 1,100 Units/hr (02/21/16 1339)   PRN Meds:.nitroGLYCERIN  Allergies:  Allergies  Allergen Reactions  . Pineapple Swelling    Reaction to fresh pineapple - tongue swelling    Social History   Social History  . Marital Status: Widowed    Spouse Name: N/A  . Number of Children: N/A  . Years of Education: N/A   Occupational History  . Retired    Social History Main Topics  . Smoking status: Never Smoker   . Smokeless tobacco: Never Used  . Alcohol Use: No  . Drug Use: No  . Sexual  Activity: No   Other Topics Concern  . Not on file   Social History Narrative   Lives alone, family helps in her care.    Family History  Problem Relation Age of Onset  . Heart disease Mother   . Heart attack Mother     died from it  . Hypertension Mother   . Arthritis Mother   . Stroke Father   . Cancer Maternal Grandmother   . Heart attack Maternal Aunt   . Renal Disease Maternal Aunt   . Multiple myeloma Maternal Uncle   . Emphysema Maternal Uncle   . Heart disease Sister   . Thyroid disease Sister   . Healthy Son     x3  . Healthy Daughter     x5   Family Status  Relation Status Death Age  . Mother Deceased 34  . Father Deceased     Review of Systems:   Full 14-point review of systems otherwise negative except as noted above.  Physical Exam: Blood pressure 196/96, pulse 65, temperature 98.7 F (37.1 C), temperature source Oral, resp. rate 19, SpO2 98 %. General: Well developed, well nourished,female in no acute distress. Head: Normocephalic, atraumatic, sclera non-icteric, no xanthomas, nares are without discharge. Dentition: poor Neck: No carotid bruits. JVD not elevated. No thyromegally Lungs: Good expansion bilaterally. without wheezes or rhonchi.  Heart: Regular rate and rhythm with S1 S2.  No S3 or S4.  No murmur, no rubs, or gallops appreciated. Abdomen: Soft, tender RUQ, non-distended with normoactive bowel sounds. No hepatomegaly. No rebound/guarding. No obvious abdominal masses. Msk:  Strength and tone appear normal for age. No joint deformities or effusions, no spine or costo-vertebral angle tenderness. Extremities: No clubbing or cyanosis. No edema.  Distal pedal pulses are 2+ in 4 extrem Neuro: Alert and oriented X 3. Moves all extremities spontaneously. No focal deficits noted. Psych:  Responds to questions appropriately with a normal affect. Skin: No rashes or lesions noted  Labs:   Lab Results  Component Value Date   WBC 9.9 02/21/2016   HGB  11.5* 02/21/2016   HCT 35.1* 02/21/2016   MCV 81.4 02/21/2016   PLT 386 02/21/2016     Recent Labs Lab 02/21/16 1055  NA 137  K 3.7  CL 105  CO2 24  BUN 25*  CREATININE 1.24*  CALCIUM 9.3  PROT 8.3*  BILITOT 0.5  ALKPHOS 116  ALT 20  AST 21  GLUCOSE 83    Recent Labs  02/21/16 1055  TROPONINI 0.09*   Lab Results  Component Value Date   CHOL 276* 11/23/2013   HDL 68 11/23/2013   LDLCALC 191* 11/23/2013   TRIG 87  11/23/2013    Radiology/Studies: Dg Chest 2 View 02/21/2016  CLINICAL DATA:  Chest pain, mid sternal radiating to the back. History of cardiac arrhythmia and hypertension. EXAM: CHEST  2 VIEW COMPARISON:  None. FINDINGS: Heart size is upper limits of normal. Atherosclerotic calcifications at the aortic arch. Mild peribronchial thickening in the lower chest may be chronic. There is no significant airspace disease or pulmonary edema. Negative for a pneumothorax. No suspicious bone findings. No large pleural effusions. IMPRESSION: Mild peribronchial thickening may be chronic. No acute chest findings. Electronically Signed   By: Markus Daft M.D.   On: 02/21/2016 11:33   Ct Angio Chest Aorta W/cm &/or Wo/cm 02/21/2016  CLINICAL DATA:  Chest pain that radiates to the back. Elevated blood pressure. Evaluate for an aortic dissection. EXAM: CT ANGIOGRAPHY CHEST, ABDOMEN AND PELVIS TECHNIQUE: Multidetector CT imaging through the chest, abdomen and pelvis was performed using the standard protocol during bolus administration of intravenous contrast. Multiplanar reconstructed images and MIPs were obtained and reviewed to evaluate the vascular anatomy. CONTRAST:  173m OMNIPAQUE IOHEXOL 350 MG/ML SOLN COMPARISON:  Chest radiograph 02/21/2016 FINDINGS: CTA CHEST FINDINGS No evidence for an aortic intramural hematoma or dissection. There is extensive mural thrombus along the posterior aortic arch and proximal descending thoracic aorta. Evidence for ulcerative plaques in this region. There  is motion artifact at the aortic root. Normal caliber of the thoracic aorta without aneurysm. Great vessels are patent. Right vertebral artery is very small. No evidence for chest lymphadenopathy. No evidence for a mediastinal hematoma. There may be a small nodule involving the inferior right thyroid lobe. There is no significant pericardial or pleural fluid. Central pulmonary arteries are patent. Trachea and mainstem bronchi are patent. Focal thickening at the confluence of the right middle and right major fissures on sequence 6, image 30. This thickening or nodularity roughly measures 4 mm on sequence 6, image 30. Mild atelectasis or scarring in the lingula. No significant airspace disease or consolidation the lungs. No acute bone abnormality. Review of the MIP images confirms the above findings. CTA ABDOMEN AND PELVIS FINDINGS Diffuse atherosclerotic disease and plaque in the abdominal aorta without aneurysm. Mild plaque at the origin the celiac trunk and SMA without significant stenosis. Main branches of the celiac trunk are patent. There appears to be moderate to severe stenosis in the proximal left renal artery with post stenotic dilatation. There is also concern for significant stenosis at the origin of the right renal artery. Inferior mesenteric artery is patent. Normal appearance of the liver without focal abnormality. High-density material in the gallbladder suggestive for stones or sludge without inflammatory changes. No significant biliary duct dilatation. Normal appearance of the pancreas without inflammation or duct dilatation. There is a poorly defined low-density lesion in the splenic hilum that extends up to the superior aspect. Unclear if this is perfusion abnormality or true lesion on this arterial phase imaging. Normal appearance of the adrenal glands. Diffuse scarring in the right kidney with a cortical calcification in the interpolar region and evidence for a stone in the lower pole measuring  roughly 4 mm. No significant hydronephrosis in the right kidney but there is focal dilatation of the mid right ureter without evidence of a stone. Probable cyst in the right kidney lower pole. The left kidney is mildly lobulated without hydronephrosis. Fluid in the urinary bladder without gross abnormality. Uterus has been removed. No acute abnormality to the stomach, small bowel or colon. Normal appearance of the appendix. There is no significant  abdominal or pelvic lymphadenopathy. No acute bone abnormality. Review of the MIP images confirms the above findings. IMPRESSION: Negative for aortic dissection or aneurysm. Diffuse atherosclerotic disease involving the aorta and visceral arteries. In particular, there is significant plaque in the posterior aortic arch and proximal descending thoracic aorta with foci of ulcerative plaque. Bilateral renal artery stenosis. Renal artery stenosis may be hemodynamically significant. Indeterminate low-density areas in the spleen. Unclear if this is a perfusion abnormality or a true lesion. This could be more definitively evaluated with MRI. Both kidneys are very lobulated and some this may be related to scarring, particularly in the right kidney. Evidence for a nonobstructive right kidney stone. 4 mm nodular density along the right lung fissures as described. If the patient is at high risk for bronchogenic carcinoma, follow-up chest CT at 1 year is recommended. If the patient is at low risk, no follow-up is needed. This recommendation follows the consensus statement: Guidelines for Management of Small Pulmonary Nodules Detected on CT Scans: A Statement from the [employer] as published in Radiology 2005; 237:395-400. Electronically Signed   By: Markus Daft M.D.   On: 02/21/2016 12:44   ECG: 02/21/2016 SR, RBBB (old)  ASSESSMENT AND PLAN:  Principal Problem:   Chest pain with high risk for cardiac etiology - pt w/ renovascular disease (new diagnosis), making  coronary disease more likely - She is on heparin and nitro - on BB and ARB on her home meds, continue these - continue to cycle enzymes, check echo - Lexi MV in am if ez remain flat or trend down - mild elevation in troponin could come from uncontrolled HTN - cath if ez trend up significantly or MV is high-risk  Active Problems:   Essential hypertension, benign - not sure if she is compliant with her multi-drug therapy. - bilateral RAS seen on CT - with renal insufficiency, will hold Diovan/HCT till MD reviews. - may need to hold ARB w/ bilateral RAS - continue other home medications and make adjustments as needed.    Hyperlipidemia LDL goal <100 - currently on Mevacor 20 mg - ck profile - may need more powerful statin    Gout - On allopurinol at 100 mg bid  Augusto Garbe 02/21/2016 6:06 PM Beeper [phone]    The patient has been seen in conjunction with Rosaria Ferries, PA-C. All aspects of care have been considered and discussed. The patient has been personally interviewed, examined, and all clinical data has been reviewed.   The history is somewhat difficult to obtain. She has risk factors for CAD including atherosclerotic bilateral renal artery stenosis. She is very sedentary and recently has been having chest discomfort with radiation to the jaw occurring at rest. Episodes last between 10 and 30 minutes. Last episode occurred last evening. She was encouraged to come to the emergency room by her primary physician after mentioning this story during an office visit today.  In the ER the electrocardiogram does not reveal acute abnormality but there is sinus bradycardia with right bundle branch block. Troponin is mildly elevated at 0.09.  I have high concern that the patient's symptoms represent angina pectoris.  Lexiscan myocardial perfusion imaging to assess level of risk. For the time being IV nitroglycerin and IV heparin will be used and we will manage as if  she has unstable angina. If moderate to high risk Lexiscan Myoview, she will need coronary angiography. If there is evidence of mild ischemia (low risk) I will suggest initial trial of  medical management before angiography.  We will hold ARB and diuretic therapy due to mild CKD and documented bilateral renal artery stenosis by CT today. We will need to track kidney function after contrast administration today.

## 2016-02-21 NOTE — ED Notes (Signed)
Pt reports chest pain x 1 month off and on, worse since last night. Pt states pain is mid sternal, "heaviness" with some sob observed by her daughter. Pt denies nausea or dizzyness, pain radiates to right shoulder, back and jaw at times.

## 2016-02-22 ENCOUNTER — Inpatient Hospital Stay (HOSPITAL_COMMUNITY): Payer: Medicare Other

## 2016-02-22 ENCOUNTER — Other Ambulatory Visit: Payer: Self-pay | Admitting: Physician Assistant

## 2016-02-22 DIAGNOSIS — I701 Atherosclerosis of renal artery: Secondary | ICD-10-CM

## 2016-02-22 DIAGNOSIS — E039 Hypothyroidism, unspecified: Secondary | ICD-10-CM | POA: Diagnosis present

## 2016-02-22 DIAGNOSIS — R079 Chest pain, unspecified: Secondary | ICD-10-CM

## 2016-02-22 LAB — LIPID PANEL
CHOL/HDL RATIO: 3.4 ratio
Cholesterol: 200 mg/dL (ref 0–200)
HDL: 58 mg/dL (ref >40)
LDL Cholesterol: 127 mg/dL — ABNORMAL HIGH (ref 0–99)
Triglycerides: 77 mg/dL (ref <150)
VLDL: 15 mg/dL (ref 0–40)

## 2016-02-22 LAB — NM MYOCAR MULTI W/SPECT W/WALL MOTION / EF
CSEPPHR: 96 {beats}/min
Rest HR: 73 {beats}/min

## 2016-02-22 LAB — BASIC METABOLIC PANEL
Anion gap: 9 (ref 5–15)
BUN: 26 mg/dL — AB (ref 6–20)
CALCIUM: 9.1 mg/dL (ref 8.9–10.3)
CO2: 23 mmol/L (ref 22–32)
CREATININE: 1.56 mg/dL — AB (ref 0.44–1.00)
Chloride: 106 mmol/L (ref 101–111)
GFR calc Af Amer: 36 mL/min — ABNORMAL LOW (ref >60)
GFR, EST NON AFRICAN AMERICAN: 31 mL/min — AB (ref >60)
Glucose, Bld: 108 mg/dL — ABNORMAL HIGH (ref 65–99)
Potassium: 3.6 mmol/L (ref 3.5–5.1)
SODIUM: 138 mmol/L (ref 135–145)

## 2016-02-22 LAB — CBC
HCT: 30.6 % — ABNORMAL LOW (ref 36.0–46.0)
Hemoglobin: 9.6 g/dL — ABNORMAL LOW (ref 12.0–15.0)
MCH: 25.3 pg — ABNORMAL LOW (ref 26.0–34.0)
MCHC: 31.4 g/dL (ref 30.0–36.0)
MCV: 80.5 fL (ref 78.0–100.0)
PLATELETS: 326 10*3/uL (ref 150–400)
RBC: 3.8 MIL/uL — ABNORMAL LOW (ref 3.87–5.11)
RDW: 15.3 % (ref 11.5–15.5)
WBC: 9.5 10*3/uL (ref 4.0–10.5)

## 2016-02-22 LAB — HEMOGLOBIN A1C
HEMOGLOBIN A1C: 5.8 % — AB (ref 4.8–5.6)
MEAN PLASMA GLUCOSE: 120 mg/dL

## 2016-02-22 LAB — TROPONIN I
TROPONIN I: 0.07 ng/mL — AB (ref <0.031)
TROPONIN I: 0.1 ng/mL — AB (ref <0.031)

## 2016-02-22 LAB — HEPARIN LEVEL (UNFRACTIONATED)
Heparin Unfractionated: 0.47 IU/mL (ref 0.30–0.70)
Heparin Unfractionated: 0.35 IU/mL (ref 0.30–0.70)

## 2016-02-22 MED ORDER — ASPIRIN 81 MG PO TBEC: 81.0000 mg | DELAYED_RELEASE_TABLET | Freq: Every day | ORAL | Status: DC

## 2016-02-22 MED ORDER — HYDRALAZINE HCL 50 MG PO TABS: 50.0000 mg | ORAL_TABLET | Freq: Three times a day (TID) | ORAL | Status: DC

## 2016-02-22 MED ORDER — NITROGLYCERIN 0.4 MG SL SUBL: 0.4000 mg | SUBLINGUAL_TABLET | SUBLINGUAL | Status: DC | PRN

## 2016-02-22 MED ORDER — TECHNETIUM TC 99M SESTAMIBI - CARDIOLITE
30.0000 | Freq: Once | INTRAVENOUS | Status: AC | PRN
  Administered 2016-02-22: 30 via INTRAVENOUS

## 2016-02-22 MED ORDER — REGADENOSON 0.4 MG/5ML IV SOLN
0.4000 mg | Freq: Once | INTRAVENOUS | Status: AC
  Administered 2016-02-22: 0.4 mg via INTRAVENOUS
  Filled 2016-02-22: qty 5

## 2016-02-22 MED ORDER — PANTOPRAZOLE SODIUM 40 MG PO TBEC: 40.0000 mg | DELAYED_RELEASE_TABLET | Freq: Every day | ORAL | Status: DC

## 2016-02-22 MED ORDER — ACETAMINOPHEN 325 MG PO TABS
ORAL_TABLET | ORAL | Status: AC
  Administered 2016-02-22: 325 mg
  Filled 2016-02-22: qty 2

## 2016-02-22 MED ORDER — TECHNETIUM TC 99M SESTAMIBI GENERIC - CARDIOLITE
10.0000 | Freq: Once | INTRAVENOUS | Status: AC | PRN
  Administered 2016-02-22: 10 via INTRAVENOUS

## 2016-02-22 MED ORDER — REGADENOSON 0.4 MG/5ML IV SOLN
INTRAVENOUS | Status: AC
  Filled 2016-02-22: qty 5

## 2016-02-22 MED ORDER — SODIUM CHLORIDE 0.9 % IJ SOLN
80.0000 mg | INTRAVENOUS | Status: AC
  Administered 2016-02-22: 80 mg via INTRAVENOUS

## 2016-02-22 NOTE — Discharge Summary (Signed)
Discharge Summary    Patient ID: Carolyn Myers,  MRN: [id number], DOB/AGE: 78-Jun-1937 78 y.o.  Admit date: 02/21/2016 Discharge date: 02/22/2016  Primary Care Provider: Leeanne Rio Primary Cardiologist: Smith-new  Discharge Diagnoses    Principal Problem:   Chest pain with high risk for cardiac etiology Active Problems:   Essential hypertension, benign   Hyperlipidemia LDL goal <100   Gout   Renal artery stenosis (HCC)   Hypothyroidism   Allergies Allergies  Allergen Reactions  . Pineapple Swelling    Reaction to fresh pineapple - tongue swelling    Diagnostic Studies/Procedures    CT ANGIOGRAPHY CHEST, ABDOMEN AND PELVIS  TECHNIQUE: Multidetector CT imaging through the chest, abdomen and pelvis was performed using the standard protocol during bolus administration of intravenous contrast. Multiplanar reconstructed images and MIPs were obtained and reviewed to evaluate the vascular anatomy.  CONTRAST: 119mL OMNIPAQUE IOHEXOL 350 MG/ML SOLN  COMPARISON: Chest radiograph 02/21/2016  FINDINGS: CTA CHEST FINDINGS  No evidence for an aortic intramural hematoma or dissection. There is extensive mural thrombus along the posterior aortic arch and proximal descending thoracic aorta. Evidence for ulcerative plaques in this region. There is motion artifact at the aortic root. Normal caliber of the thoracic aorta without aneurysm. Great vessels are patent. Right vertebral artery is very small. No evidence for chest lymphadenopathy. No evidence for a mediastinal hematoma. There may be a small nodule involving the inferior right thyroid lobe. There is no significant pericardial or pleural fluid. Central pulmonary arteries are patent.  Trachea and mainstem bronchi are patent. Focal thickening at the confluence of the right middle and right major fissures on sequence 6, image 30. This thickening or nodularity roughly measures 4 mm on sequence 6,  image 30. Mild atelectasis or scarring in the lingula. No significant airspace disease or consolidation the lungs. No acute bone abnormality.  Review of the MIP images confirms the above findings.  CTA ABDOMEN AND PELVIS FINDINGS  Diffuse atherosclerotic disease and plaque in the abdominal aorta without aneurysm. Mild plaque at the origin the celiac trunk and SMA without significant stenosis. Main branches of the celiac trunk are patent. There appears to be moderate to severe stenosis in the proximal left renal artery with post stenotic dilatation. There is also concern for significant stenosis at the origin of the right renal artery. Inferior mesenteric artery is patent.  Normal appearance of the liver without focal abnormality. High-density material in the gallbladder suggestive for stones or sludge without inflammatory changes. No significant biliary duct dilatation. Normal appearance of the pancreas without inflammation or duct dilatation. There is a poorly defined low-density lesion in the splenic hilum that extends up to the superior aspect. Unclear if this is perfusion abnormality or true lesion on this arterial phase imaging. Normal appearance of the adrenal glands. Diffuse scarring in the right kidney with a cortical calcification in the interpolar region and evidence for a stone in the lower pole measuring roughly 4 mm. No significant hydronephrosis in the right kidney but there is focal dilatation of the mid right ureter without evidence of a stone. Probable cyst in the right kidney lower pole. The left kidney is mildly lobulated without hydronephrosis. Fluid in the urinary bladder without gross abnormality. Uterus has been removed. No acute abnormality to the stomach, small bowel or colon. Normal appearance of the appendix. There is no significant abdominal or pelvic lymphadenopathy. No acute bone abnormality.  Review of the MIP images confirms the above  findings.  IMPRESSION: Negative for aortic dissection or aneurysm. Diffuse atherosclerotic disease involving the aorta and visceral arteries. In particular, there is significant plaque in the posterior aortic arch and proximal descending thoracic aorta with foci of ulcerative plaque.  Bilateral renal artery stenosis. Renal artery stenosis may be hemodynamically significant.  Indeterminate low-density areas in the spleen. Unclear if this is a perfusion abnormality or a true lesion. This could be more definitively evaluated with MRI.  Both kidneys are very lobulated and some this may be related to scarring, particularly in the right kidney. Evidence for a nonobstructive right kidney stone.  4 mm nodular density along the right lung fissures as described. If the patient is at high risk for bronchogenic carcinoma, follow-up chest CT at 1 year is recommended. If the patient is at low risk, no follow-up is needed. This recommendation follows the consensus statement: Guidelines for Management of Small Pulmonary Nodules Detected on CT Scans: A Statement from the [facility] as published in Radiology 2005; 237:395-400.  MYOCARDIAL IMAGING WITH SPECT (REST AND PHARMACOLOGIC-STRESS)  GATED LEFT VENTRICULAR WALL MOTION STUDY  LEFT VENTRICULAR EJECTION FRACTION  TECHNIQUE: Standard myocardial SPECT imaging was performed after resting intravenous injection of 10 mCi Tc-18m sestamibi. Subsequently, intravenous infusion of Lexiscan was performed under the supervision of the Cardiology staff. At peak effect of the drug, 30 mCi Tc-22m sestamibi was injected intravenously and standard myocardial SPECT imaging was performed. Quantitative gated imaging was also performed to evaluate left ventricular wall motion, and estimate left ventricular ejection fraction.  COMPARISON: None.  FINDINGS: Perfusion: Mild scratch the mildly decreased inferior wall activity on both resting  and stress images. No differential activity in the left ventricle on stress imaging to suggest reversible ischemia .  Wall Motion: Mild septal hypokinesis. No left ventricular dilation.  Left Ventricular Ejection Fraction: 55 %  End diastolic volume 69 ml  End systolic volume 31 ml  IMPRESSION: 1. No evidence of ischemia. Mild inferior wall attenuation without associated wall motion abnormality.  2. Mild septal hypokinesis.  3. Left ventricular ejection fraction 55%  4. Low-risk stress test findings*.  _____________   History of Present Illness     Carolyn Myers is a 80 y.o. female with a history of HTN, HLD, FH CAD, gout, diverticulitis.  She went to see IM provider today for routine followup. Described symptoms and sent to ER for admission.   Pt started getting a dull ache in her chest that began increasing in frequency. It would occur 2-3 x week. No exertional component, started about a month ago. It made her SOB, no N&V or diaphoresis. It was worse with deep inspiration. Initially it was 5/10. The pain began going through to her back and would go up into her jaw. The symptoms were initially relieved by [place], but then that quit working. The shortest episode was about 30 minutes, the longest one lasted all night (this week). This week, symptoms worsened and pt was worried it was more than reflux. She had 2 episodes where it kept her up and she could not sleep. These reached 10/10. Pt not taking anything daily for reflux.   Pt not very active. She does not do steps. Family does grocery shopping.  She does not drive. Pt occasionally walks with family at the mall, but generally only leaves the house 3 x month. Occasional constipation, drinks more water.     Hospital Course     Consultants: None  Patient was admitted and started on IV heparin and  IV nitroglycerin.  She underwent nuclear stress testing which was read as low risk.  She had mildly elevated  troponin with a flat trend. This is likely due to hypertensive urgency. ARB and diaphoretic therapy was held due to mild chronic kidney disease and documented bilateral renal artery stenosis by CT angio.  Was negative for aortic dissection aneurysm.  Will order outpatient renal Dopplers.   Hydralazine was increased to 50 mg 3 times a day.  Serum creatinine increased slightly to 1.56.   We also check her lipid panel and her LDL cholesterol was 127.  She was changed to a high-dose statin.  TSH is greater than 90. The patient reports being compliant with her Synthroid, which is 200 g per day.  She will need outpatient follow-up for this as well her primary care provider was copied on this note.  She ambulated up and down the hall without any chest pain prior to discharge.  She had a 4 mm nodular density along the right lung fissure seen on CT scan. Patient reports she's never been a smoker.  The patient was seen by Dr. Sallyanne Kuster who felt she was stable for DC home.  Follow-up needs: Thyroid Renal Dopplers-ordered Blood pressure Bmet  Consider an outpatient echocardiogram but don't think she needs it right now. _____________  Discharge Vitals Blood pressure 157/75, pulse 71, temperature 98.9 F (37.2 C), temperature source Oral, resp. rate 29, height 5\' 3"  (1.6 m), weight 183 lb 11.2 oz (83.326 kg), SpO2 96 %.  Filed Weights   02/21/16 2148 02/22/16 0500  Weight: 183 lb 8 oz (83.235 kg) 183 lb 11.2 oz (83.326 kg)    Labs & Radiologic Studies     CBC  Recent Labs  02/21/16 1055 02/22/16 0728  WBC 9.9 9.5  NEUTROABS 5.2  --   HGB 11.5* 9.6*  HCT 35.1* 30.6*  MCV 81.4 80.5  PLT 386 [id number]   Basic Metabolic Panel  Recent Labs  02/21/16 1055 02/22/16 0728  NA 137 138  K 3.7 3.6  CL 105 106  CO2 24 23  GLUCOSE 83 108*  BUN 25* 26*  CREATININE 1.24* 1.56*  CALCIUM 9.3 9.1   Liver Function Tests  Recent Labs  02/21/16 1055 02/21/16 1831  AST 21 46*  ALT 20 19  ALKPHOS 116  100  BILITOT 0.5 0.5  PROT 8.3* 8.0  ALBUMIN 4.2 3.8   No results for input(s): LIPASE, AMYLASE in the last 72 hours. Cardiac Enzymes  Recent Labs  02/21/16 1831 02/22/16 0024 02/22/16 0728  TROPONINI 0.10* 0.10* 0.07*   Hemoglobin A1C  Recent Labs  02/21/16 1831  HGBA1C 5.8*   Fasting Lipid Panel  Recent Labs  02/22/16 0024  CHOL 200  HDL 58  LDLCALC 127*  TRIG 77  CHOLHDL 3.4   Thyroid Function Tests  Recent Labs  02/21/16 1831  TSH >90.000*    Dg Chest 2 View  02/21/2016  CLINICAL DATA:  Chest pain, mid sternal radiating to the back. History of cardiac arrhythmia and hypertension. EXAM: CHEST  2 VIEW COMPARISON:  None. FINDINGS: Heart size is upper limits of normal. Atherosclerotic calcifications at the aortic arch. Mild peribronchial thickening in the lower chest may be chronic. There is no significant airspace disease or pulmonary edema. Negative for a pneumothorax. No suspicious bone findings. No large pleural effusions. IMPRESSION: Mild peribronchial thickening may be chronic. No acute chest findings. Electronically Signed   By: Markus Daft M.D.   On: 02/21/2016 11:33  Nm Myocar Multi W/spect W/wall Motion / Ef  02/22/2016  CLINICAL DATA:  Chest pain x3 weeks, shortness of Breath EXAM: MYOCARDIAL IMAGING WITH SPECT (REST AND PHARMACOLOGIC-STRESS) GATED LEFT VENTRICULAR WALL MOTION STUDY LEFT VENTRICULAR EJECTION FRACTION TECHNIQUE: Standard myocardial SPECT imaging was performed after resting intravenous injection of 10 mCi Tc-63m sestamibi. Subsequently, intravenous infusion of Lexiscan was performed under the supervision of the Cardiology staff. At peak effect of the drug, 30 mCi Tc-64m sestamibi was injected intravenously and standard myocardial SPECT imaging was performed. Quantitative gated imaging was also performed to evaluate left ventricular wall motion, and estimate left ventricular ejection fraction. COMPARISON:  None. FINDINGS: Perfusion: Mild scratch the  mildly decreased inferior wall activity on both resting and stress images. No differential activity in the left ventricle on stress imaging to suggest reversible ischemia . Wall Motion: Mild septal hypokinesis.  No left ventricular dilation. Left Ventricular Ejection Fraction: 55 % End diastolic volume 69 ml End systolic volume 31 ml IMPRESSION: 1. No evidence of ischemia. Mild inferior wall attenuation without associated wall motion abnormality. 2. Mild septal hypokinesis. 3. Left ventricular ejection fraction 55% 4. Low-risk stress test findings*. *2012 Appropriate Use Criteria for Coronary Revascularization Focused Update: J Am Coll Cardiol. [id number]. http://content.[email or web address].aspx?articleid=[email or web address] Electronically Signed   By: Lucrezia Europe M.D.   On: 02/22/2016 13:44   Ct Angio Chest Aorta W/cm &/or Wo/cm  02/21/2016  CLINICAL DATA:  Chest pain that radiates to the back. Elevated blood pressure. Evaluate for an aortic dissection. EXAM: CT ANGIOGRAPHY CHEST, ABDOMEN AND PELVIS TECHNIQUE: Multidetector CT imaging through the chest, abdomen and pelvis was performed using the standard protocol during bolus administration of intravenous contrast. Multiplanar reconstructed images and MIPs were obtained and reviewed to evaluate the vascular anatomy. CONTRAST:  163mL OMNIPAQUE IOHEXOL 350 MG/ML SOLN COMPARISON:  Chest radiograph 02/21/2016 FINDINGS: CTA CHEST FINDINGS No evidence for an aortic intramural hematoma or dissection. There is extensive mural thrombus along the posterior aortic arch and proximal descending thoracic aorta. Evidence for ulcerative plaques in this region. There is motion artifact at the aortic root. Normal caliber of the thoracic aorta without aneurysm. Great vessels are patent. Right vertebral artery is very small. No evidence for chest lymphadenopathy. No evidence for a mediastinal hematoma. There may be a small nodule involving the inferior right thyroid lobe. There is no  significant pericardial or pleural fluid. Central pulmonary arteries are patent. Trachea and mainstem bronchi are patent. Focal thickening at the confluence of the right middle and right major fissures on sequence 6, image 30. This thickening or nodularity roughly measures 4 mm on sequence 6, image 30. Mild atelectasis or scarring in the lingula. No significant airspace disease or consolidation the lungs. No acute bone abnormality. Review of the MIP images confirms the above findings. CTA ABDOMEN AND PELVIS FINDINGS Diffuse atherosclerotic disease and plaque in the abdominal aorta without aneurysm. Mild plaque at the origin the celiac trunk and SMA without significant stenosis. Main branches of the celiac trunk are patent. There appears to be moderate to severe stenosis in the proximal left renal artery with post stenotic dilatation. There is also concern for significant stenosis at the origin of the right renal artery. Inferior mesenteric artery is patent. Normal appearance of the liver without focal abnormality. High-density material in the gallbladder suggestive for stones or sludge without inflammatory changes. No significant biliary duct dilatation. Normal appearance of the pancreas without inflammation or duct dilatation. There is a poorly defined low-density lesion in the splenic hilum that extends  up to the superior aspect. Unclear if this is perfusion abnormality or true lesion on this arterial phase imaging. Normal appearance of the adrenal glands. Diffuse scarring in the right kidney with a cortical calcification in the interpolar region and evidence for a stone in the lower pole measuring roughly 4 mm. No significant hydronephrosis in the right kidney but there is focal dilatation of the mid right ureter without evidence of a stone. Probable cyst in the right kidney lower pole. The left kidney is mildly lobulated without hydronephrosis. Fluid in the urinary bladder without gross abnormality. Uterus has  been removed. No acute abnormality to the stomach, small bowel or colon. Normal appearance of the appendix. There is no significant abdominal or pelvic lymphadenopathy. No acute bone abnormality. Review of the MIP images confirms the above findings. IMPRESSION: Negative for aortic dissection or aneurysm. Diffuse atherosclerotic disease involving the aorta and visceral arteries. In particular, there is significant plaque in the posterior aortic arch and proximal descending thoracic aorta with foci of ulcerative plaque. Bilateral renal artery stenosis. Renal artery stenosis may be hemodynamically significant. Indeterminate low-density areas in the spleen. Unclear if this is a perfusion abnormality or a true lesion. This could be more definitively evaluated with MRI. Both kidneys are very lobulated and some this may be related to scarring, particularly in the right kidney. Evidence for a nonobstructive right kidney stone. 4 mm nodular density along the right lung fissures as described. If the patient is at high risk for bronchogenic carcinoma, follow-up chest CT at 1 year is recommended. If the patient is at low risk, no follow-up is needed. This recommendation follows the consensus statement: Guidelines for Management of Small Pulmonary Nodules Detected on CT Scans: A Statement from the [facility] as published in Radiology 2005; 237:395-400. Electronically Signed   By: Markus Daft M.D.   On: 02/21/2016 12:44   Ct Angio Abd/pel W/ And/or W/o  02/21/2016  CLINICAL DATA:  Chest pain that radiates to the back. Elevated blood pressure. Evaluate for an aortic dissection. EXAM: CT ANGIOGRAPHY CHEST, ABDOMEN AND PELVIS TECHNIQUE: Multidetector CT imaging through the chest, abdomen and pelvis was performed using the standard protocol during bolus administration of intravenous contrast. Multiplanar reconstructed images and MIPs were obtained and reviewed to evaluate the vascular anatomy. CONTRAST:  172mL OMNIPAQUE  IOHEXOL 350 MG/ML SOLN COMPARISON:  Chest radiograph 02/21/2016 FINDINGS: CTA CHEST FINDINGS No evidence for an aortic intramural hematoma or dissection. There is extensive mural thrombus along the posterior aortic arch and proximal descending thoracic aorta. Evidence for ulcerative plaques in this region. There is motion artifact at the aortic root. Normal caliber of the thoracic aorta without aneurysm. Great vessels are patent. Right vertebral artery is very small. No evidence for chest lymphadenopathy. No evidence for a mediastinal hematoma. There may be a small nodule involving the inferior right thyroid lobe. There is no significant pericardial or pleural fluid. Central pulmonary arteries are patent. Trachea and mainstem bronchi are patent. Focal thickening at the confluence of the right middle and right major fissures on sequence 6, image 30. This thickening or nodularity roughly measures 4 mm on sequence 6, image 30. Mild atelectasis or scarring in the lingula. No significant airspace disease or consolidation the lungs. No acute bone abnormality. Review of the MIP images confirms the above findings. CTA ABDOMEN AND PELVIS FINDINGS Diffuse atherosclerotic disease and plaque in the abdominal aorta without aneurysm. Mild plaque at the origin the celiac trunk and SMA without significant stenosis. Main branches of  the celiac trunk are patent. There appears to be moderate to severe stenosis in the proximal left renal artery with post stenotic dilatation. There is also concern for significant stenosis at the origin of the right renal artery. Inferior mesenteric artery is patent. Normal appearance of the liver without focal abnormality. High-density material in the gallbladder suggestive for stones or sludge without inflammatory changes. No significant biliary duct dilatation. Normal appearance of the pancreas without inflammation or duct dilatation. There is a poorly defined low-density lesion in the splenic hilum  that extends up to the superior aspect. Unclear if this is perfusion abnormality or true lesion on this arterial phase imaging. Normal appearance of the adrenal glands. Diffuse scarring in the right kidney with a cortical calcification in the interpolar region and evidence for a stone in the lower pole measuring roughly 4 mm. No significant hydronephrosis in the right kidney but there is focal dilatation of the mid right ureter without evidence of a stone. Probable cyst in the right kidney lower pole. The left kidney is mildly lobulated without hydronephrosis. Fluid in the urinary bladder without gross abnormality. Uterus has been removed. No acute abnormality to the stomach, small bowel or colon. Normal appearance of the appendix. There is no significant abdominal or pelvic lymphadenopathy. No acute bone abnormality. Review of the MIP images confirms the above findings. IMPRESSION: Negative for aortic dissection or aneurysm. Diffuse atherosclerotic disease involving the aorta and visceral arteries. In particular, there is significant plaque in the posterior aortic arch and proximal descending thoracic aorta with foci of ulcerative plaque. Bilateral renal artery stenosis. Renal artery stenosis may be hemodynamically significant. Indeterminate low-density areas in the spleen. Unclear if this is a perfusion abnormality or a true lesion. This could be more definitively evaluated with MRI. Both kidneys are very lobulated and some this may be related to scarring, particularly in the right kidney. Evidence for a nonobstructive right kidney stone. 4 mm nodular density along the right lung fissures as described. If the patient is at high risk for bronchogenic carcinoma, follow-up chest CT at 1 year is recommended. If the patient is at low risk, no follow-up is needed. This recommendation follows the consensus statement: Guidelines for Management of Small Pulmonary Nodules Detected on CT Scans: A Statement from the  [employer] as published in Radiology 2005; 237:395-400. Electronically Signed   By: Markus Daft M.D.   On: 02/21/2016 12:44    Disposition   Pt is being discharged home today in good condition.  Follow-up Plans & Appointments    Follow-up Information    Follow up with Sinclair Grooms, MD.   Specialty:  Cardiology   Why:  The office will call you Monday with your follow up appointment.    Contact information:   [id number] [address] [phone]      Discharge Instructions    Diet - low sodium heart healthy    Complete by:  As directed      Increase activity slowly    Complete by:  As directed            Discharge Medications   Current Discharge Medication List    START taking these medications   Details  aspirin EC 81 MG EC tablet Take 1 tablet (81 mg total) by mouth daily.    nitroGLYCERIN (NITROSTAT) 0.4 MG SL tablet Place 1 tablet (0.4 mg total) under the tongue every 5 (five) minutes x 3 doses as needed for chest pain.  Qty: 25 tablet, Refills: 12    pantoprazole (PROTONIX) 40 MG tablet Take 1 tablet (40 mg total) by mouth daily at 6 (six) AM. Qty: 30 tablet, Refills: 11      CONTINUE these medications which have CHANGED   Details  hydrALAZINE (APRESOLINE) 50 MG tablet Take 1 tablet (50 mg total) by mouth 3 (three) times daily. Qty: 90 tablet, Refills: 11      CONTINUE these medications which have NOT CHANGED   Details  acetaminophen (TYLENOL) 325 MG tablet Take 650 mg by mouth every 6 (six) hours as needed (pain).     allopurinol (ZYLOPRIM) 100 MG tablet Take 2 tablets (200 mg total) by mouth daily. Qty: 60 tablet, Refills: 5    amLODipine (NORVASC) 10 MG tablet TAKE 1 TABLET BY MOUTH EVERY DAY Qty: 30 tablet, Refills: 3    atenolol (TENORMIN) 25 MG tablet Take 25 mg by mouth daily at 12 noon.  Refills: 3    levothyroxine (SYNTHROID, LEVOTHROID) 200 MCG tablet TAKE 1 TABLET BY MOUTH EVERY DAY BEFORE  BREAKFAST Qty: 30 tablet, Refills: 3    lovastatin (MEVACOR) 20 MG tablet Take 20 mg by mouth at bedtime.  Refills: 3    meclizine (ANTIVERT) 25 MG tablet Take 25 mg by mouth 3 (three) times daily as needed for dizziness.    OVER THE COUNTER MEDICATION Place 1 drop into both eyes daily as needed (dry eyes). Over the counter lubricating eye drops      STOP taking these medications     valsartan-hydrochlorothiazide (DIOVAN HCT) 160-25 MG tablet            Outstanding Labs/Studies     Duration of Discharge Encounter   Greater than 30 minutes including physician time.  Otilio Connors PAC 02/22/2016, 3:29 PM

## 2016-02-22 NOTE — Progress Notes (Signed)
ANTICOAGULATION CONSULT NOTE - Follow Up Consult  Pharmacy Consult for heparin Indication: chest pain/ACS  Allergies  Allergen Reactions  . Pineapple Swelling    Reaction to fresh pineapple - tongue swelling    Patient Measurements: Height: 5\' 3"  (160 cm) Weight: 183 lb 11.2 oz (83.326 kg) IBW/kg (Calculated) : 52.4 Heparin Dosing Weight: 70.8 kg  Vital Signs: Temp: 98.9 F (37.2 C) (03/04 0500) BP: 157/75 mmHg (03/04 1012) Pulse Rate: 71 (03/04 0500)  Labs:  Recent Labs  02/21/16 1055 02/21/16 1831 02/21/16 2254 02/22/16 0024 02/22/16 0728  HGB 11.5*  --   --   --  9.6*  HCT 35.1*  --   --   --  30.6*  PLT 386  --   --   --  326  LABPROT  --  13.5  --   --   --   INR  --  1.01  --   --   --   HEPARINUNFRC  --   --  0.71*  --  0.47  CREATININE 1.24*  --   --   --  1.56*  TROPONINI 0.09* 0.10*  --  0.10* 0.07*    Estimated Creatinine Clearance: 30.9 mL/min (by C-G formula based on Cr of 1.56).   Assessment: 80 yo f admitted with CP. Pharmacy is consulted to dose heparin infusion. No AC PTA. Heparin level is therapeutic x 1 (0.47) on 1000 units/hr. Hgb dropped 11.5>9.6, plts 326, no issues per RN.   Goal of Therapy:  Heparin level 0.3-0.7 units/ml Monitor platelets by anticoagulation protocol: Yes   Plan:  - Continue heparin infusion at 1000 units/hr - 8-hr confirmatory HL @ 1530 - Monitor plans for cath - Monitor s/s of bleeding  Nakeisha Greenhouse L. Nicole Kindred, PharmD PGY2 Infectious Diseases Pharmacy Resident Pager: [phone] 02/22/2016 12:04 PM

## 2016-02-22 NOTE — Progress Notes (Signed)
Subjective: No CP or SOB  Objective: Vital signs in last 24 hours: Temp:  [98.1 F (36.7 C)-98.9 F (37.2 C)] 98.9 F (37.2 C) (03/04 0500) Pulse Rate:  [59-78] 71 (03/04 0500) Resp:  [14-29] 29 (03/04 0500) BP: (128-223)/(59-102) 164/90 mmHg (03/04 0934) SpO2:  [96 %-100 %] 96 % (03/04 0500) Weight:  [183 lb 8 oz (83.235 kg)-183 lb 11.2 oz (83.326 kg)] 183 lb 11.2 oz (83.326 kg) (03/04 0500) Last BM Date: 02/20/16  Intake/Output from previous day: 03/03 0701 - 03/04 0700 In: 363 [P.O.:360; I.V.:3] Out: 250 [Urine:250] Intake/Output this shift:    Medications Scheduled Meds: . allopurinol  100 mg Oral BID  . amLODipine  10 mg Oral Daily  . aspirin EC  81 mg Oral Daily  . atenolol  25 mg Oral Q1200  . hydrALAZINE  25 mg Oral TID  . levothyroxine  200 mcg Oral QAC breakfast  . pantoprazole  40 mg Oral Q0600  . pravastatin  20 mg Oral q1800  . regadenoson      . sodium chloride flush  3 mL Intravenous Q12H   Continuous Infusions: . heparin 1,000 Units/hr (02/22/16 0009)   PRN Meds:.sodium chloride, acetaminophen, ALPRAZolam, hydrALAZINE, metoprolol, nitroGLYCERIN, nitroGLYCERIN, ondansetron (ZOFRAN) IV, sodium chloride flush, zolpidem  PE: obese, well developed, in no acute distress HEENT: Pupils are equal round react to light accommodation extraocular movements are intact.  Neck: no JVDNo cervical lymphadenopathy. Cardiac: Regular rate and rhythm without murmurs rubs or gallops. Lungs:  clear to auscultation bilaterally, no wheezing, rhonchi or rales Ext: no lower extremity edema.  2+ radial and dorsalis pedis pulses. Skin: warm and dry Neuro:  Grossly normal    Lab Results:   Recent Labs  02/21/16 1055 02/22/16 0728  WBC 9.9 9.5  HGB 11.5* 9.6*  HCT 35.1* 30.6*  PLT 386 326   BMET  Recent Labs  02/21/16 1055 02/22/16 0728  NA 137 138  K 3.7 3.6  CL 105 106  CO2 24 23  GLUCOSE 83 108*  BUN 25* 26*  CREATININE 1.24* 1.56*  CALCIUM 9.3  9.1   PT/INR  Recent Labs  02/21/16 1831  LABPROT 13.5  INR 1.01   Cholesterol  Recent Labs  02/22/16 0024  CHOL 200   Lipid Panel     Component Value Date/Time   CHOL 200 02/22/2016 0024   TRIG 77 02/22/2016 0024   HDL 58 02/22/2016 0024   CHOLHDL 3.4 02/22/2016 0024   VLDL 15 02/22/2016 0024   LDLCALC 127* 02/22/2016 0024    Cardiac Panel (last 3 results)  Recent Labs  02/21/16 1831 02/22/16 0024 02/22/16 0728  TROPONINI 0.10* 0.10* 0.07*     Assessment/Plan  Principal Problem:   Chest pain with high risk for cardiac etiology Active Problems:   Essential hypertension, benign   Hyperlipidemia LDL goal <100   Gout  Troponin 0.10 with flat trend.  BP poorly controlled.  Increase hydralazine to 50 TID.  LDL 127.  I recommend a more potent statin.  Change to lipitor 80.  Tylenol for headache which developed during [place].  Lexiscan completed. During the [place] the patient developed 1/10 CP which resolved.  About 1-2 minutes after we disconnected her from the EKG, she developed 8/10 CP radiating to her back.  We reconnected the EKG and she had 83mm ST depression in V4,5, and inferiorly.  Aminophylline given and IV NTG resumed at 54mcg/min.  Pain decreased to 2/10 in her back and 0/10 in chest.  LOS: 1 day    HAGER, BRYAN PA-C 02/22/2016 10:02 AM  I have seen and examined the patient along with HAGER, BRYAN PA-C.  I have reviewed the chart, notes and new data.  I agree with PA's note.  Chest symptoms with ECG changes occurring relatively late after regadenoson are concerning for coronary insufficiency.  PLAN: Wait for perfusion images before deciding on cath, but threshold is low for recommending coronary angio. Will need hydration and avoid RAAS inhibitors before cath.  Sanda Klein, MD, [place] [phone] 02/22/2016, 12:16 PM

## 2016-03-12 ENCOUNTER — Encounter: Payer: Medicare Other | Admitting: Cardiology

## 2016-03-13 ENCOUNTER — Inpatient Hospital Stay (HOSPITAL_COMMUNITY): Admission: RE | Admit: 2016-03-13 | Payer: Medicare Other | Source: Ambulatory Visit

## 2016-03-13 ENCOUNTER — Ambulatory Visit (HOSPITAL_COMMUNITY)
Admission: RE | Admit: 2016-03-13 | Discharge: 2016-03-13 | Disposition: A | Discharge status: Home / Self Care | Payer: Medicare Other | Source: Ambulatory Visit | Attending: Cardiology | Admitting: Cardiology

## 2016-03-13 ENCOUNTER — Encounter: Payer: Self-pay | Admitting: Physician Assistant

## 2016-03-13 ENCOUNTER — Ambulatory Visit (INDEPENDENT_AMBULATORY_CARE_PROVIDER_SITE_OTHER): Payer: Medicare Other | Admitting: Physician Assistant

## 2016-03-13 VITALS — BP 148/88 | HR 60 | Ht 63.0 in | Wt 185.0 lb

## 2016-03-13 DIAGNOSIS — E785 Hyperlipidemia, unspecified: Secondary | ICD-10-CM | POA: Insufficient documentation

## 2016-03-13 DIAGNOSIS — I701 Atherosclerosis of renal artery: Secondary | ICD-10-CM | POA: Diagnosis not present

## 2016-03-13 DIAGNOSIS — R079 Chest pain, unspecified: Secondary | ICD-10-CM | POA: Diagnosis not present

## 2016-03-13 DIAGNOSIS — D649 Anemia, unspecified: Secondary | ICD-10-CM

## 2016-03-13 DIAGNOSIS — N189 Chronic kidney disease, unspecified: Secondary | ICD-10-CM | POA: Diagnosis not present

## 2016-03-13 DIAGNOSIS — R002 Palpitations: Secondary | ICD-10-CM | POA: Insufficient documentation

## 2016-03-13 DIAGNOSIS — I10 Essential (primary) hypertension: Secondary | ICD-10-CM | POA: Diagnosis not present

## 2016-03-13 DIAGNOSIS — I774 Celiac artery compression syndrome: Secondary | ICD-10-CM | POA: Diagnosis not present

## 2016-03-13 LAB — CBC
HCT: 32.3 % — ABNORMAL LOW (ref 36.0–46.0)
HEMOGLOBIN: 10.5 g/dL — AB (ref 12.0–15.0)
MCH: 26.1 pg (ref 26.0–34.0)
MCHC: 32.5 g/dL (ref 30.0–36.0)
MCV: 80.1 fL (ref 78.0–100.0)
MPV: 8.8 fL (ref 8.6–12.4)
Platelets: 369 10*3/uL (ref 150–400)
RBC: 4.03 MIL/uL (ref 3.87–5.11)
RDW: 15.3 % (ref 11.5–15.5)
WBC: 7.5 10*3/uL (ref 4.0–10.5)

## 2016-03-13 LAB — BASIC METABOLIC PANEL
BUN: 26 mg/dL — ABNORMAL HIGH (ref 7–25)
CALCIUM: 9.8 mg/dL (ref 8.6–10.4)
CO2: 24 mmol/L (ref 20–31)
Chloride: 106 mmol/L (ref 98–110)
Creat: 1.33 mg/dL — ABNORMAL HIGH (ref 0.60–0.93)
GLUCOSE: 123 mg/dL — AB (ref 65–99)
POTASSIUM: 4 mmol/L (ref 3.5–5.3)
SODIUM: 140 mmol/L (ref 135–146)

## 2016-03-13 MED ORDER — ISOSORBIDE MONONITRATE ER 30 MG PO TB24: 30.0000 mg | ORAL_TABLET | Freq: Every day | ORAL | Status: DC

## 2016-03-13 NOTE — Progress Notes (Signed)
Cardiology Office Note   Date:  03/13/2016   ID:  Carolyn Myers, DOB Jan 04, 1936, MRN [id number]  PCP:  Leeanne Rio, PA-C  Cardiologist:  Dr. Tamala Julian  Chief Complaint  Patient presents with  . Follow-up    seen for Dr. Tamala Julian, post hospital visit      History of Present Illness: Carolyn Myers is a 80 y.o. female who presents for post hospital follow-up. She has a history of hypertension, hyperlipidemia, renal artery stenosis and hypothyroidism went to his PCP for routine follow-up when she complained of progressive chest pain. She was sent to ED for further evaluation. She was admitted to cardiology service. CT of the abdomen and pelvis showed bilateral renal artery stenosis, 4 mm nodular density in the right lung, nonobstructive right kidney stone. She did have mildly elevated troponin in the setting of hypertensive urgency. She underwent nuclear stress test which was read as low risk, EF 55%, mild inferior wall attenuation without associated wall motion abnormality, no evidence of ischemia, mild septal hypokinesis.. The plan is for outpatient renal artery doppler along with aggressive control of her blood pressure. Her lipid profile was high, she was changed to high-dose statin. TSH was also high at > 90, although patient states she is compliant with Synthroid. She will need close follow-up with her PCP on this.  She was seen on 02/22/2016 at which time she denies any further chest discomfort, she is deemed stable for discharge from cardiology perspective. She continued to describe occasional chest discomfort that happens both at rest and with exertion occurring roughly 3 times a week. She states it is accompanied by palpitation feeling in her chest. I did not see any arrhythmia recorded in the hospital setting. She is on atenolol for her blood pressure and because of her bradycardia, I'm hesitant to increase it. I discussed with her about her severely elevated TSH, she  understands she will need to talk to her PCP. Her blood pressure remained mildly elevated 148/88 today, I prefer her blood pressure in the 130s, given occasional chest discomfort and negative Myoview, I will add Imdur 30 mg daily to her medical regimen. 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 I will check with Dr. Tamala Julian to see if he want referral to have this treated prior to her next visit or would he wish to address it on follow-up.    Past Medical History  Diagnosis Date  . Chicken pox   . Diverticulitis   . Hyperlipidemia   . Hypertension   . Gout     Right Foot  . Hypothyroidism   . Palpitations     "doctor thought it was related to my thyroid"  . Sleep apnea     "I don't wear my mask" (02/21/2016)  . History of blood transfusion     "said my blood was low"  . Frequent headaches     "maybe twice/week" (03/12/2016)  . Migraine     "maybe twice/year" (02/21/2016)  . Arthritis     "minor in my shoulders" (02/21/2016)  . History of gout     Past Surgical History  Procedure Laterality Date  . Abdominal hysterectomy      "for fibroid tumors"  . Tubal ligation       Current Outpatient Prescriptions  Medication Sig Dispense Refill  . acetaminophen (TYLENOL) 325 MG tablet Take 650 mg by  mouth every 6 (six) hours as needed (pain).     Marland Kitchen allopurinol (ZYLOPRIM) 100 MG tablet Take 2 tablets (200 mg total) by mouth daily. (Patient taking differently: Take 100 mg by mouth 2 (two) times daily. ) 60 tablet 5  . amLODipine (NORVASC) 10 MG tablet TAKE 1 TABLET BY MOUTH EVERY DAY 30 tablet 3  . aspirin EC 81 MG EC tablet Take 1 tablet (81 mg total) by mouth daily.    Marland Kitchen atenolol (TENORMIN) 25 MG tablet Take 25 mg by mouth daily at 12 noon.   3  . hydrALAZINE (APRESOLINE) 50 MG tablet Take 1 tablet (50 mg total) by mouth 3 (three) times daily. 90 tablet 11  .  levothyroxine (SYNTHROID, LEVOTHROID) 200 MCG tablet TAKE 1 TABLET BY MOUTH EVERY DAY BEFORE BREAKFAST 30 tablet 3  . lovastatin (MEVACOR) 20 MG tablet Take 20 mg by mouth at bedtime.   3  . meclizine (ANTIVERT) 25 MG tablet Take 25 mg by mouth 3 (three) times daily as needed for dizziness.    . nitroGLYCERIN (NITROSTAT) 0.4 MG SL tablet Place 1 tablet (0.4 mg total) under the tongue every 5 (five) minutes x 3 doses as needed for chest pain. 25 tablet 12  . OVER THE COUNTER MEDICATION Place 1 drop into both eyes daily as needed (dry eyes). Over the counter lubricating eye drops    . pantoprazole (PROTONIX) 40 MG tablet Take 1 tablet (40 mg total) by mouth daily at 6 (six) AM. 30 tablet 11  . isosorbide mononitrate (IMDUR) 30 MG 24 hr tablet Take 1 tablet (30 mg total) by mouth daily. 90 tablet 3   No current facility-administered medications for this visit.    Allergies:   Pineapple    Social History:  The patient  reports that she has never smoked. She has never used smokeless tobacco. She reports that she does not drink alcohol or use illicit drugs.   Family History:  The patient's family history includes Arthritis in her mother; Cancer in her maternal grandmother; Emphysema in her maternal uncle; Healthy in her daughter and son; Heart attack in her maternal aunt and mother; Heart disease in her mother and sister; Hypertension in her mother; Multiple myeloma in her maternal uncle; Renal Disease in her maternal aunt; Stroke in her father; Thyroid disease in her sister.    ROS:  Please see the history of present illness.   Otherwise, review of systems are positive for occasional palpitation and chest discomfort.   All other systems are reviewed and negative.    PHYSICAL EXAM: VS:  BP 148/88 mmHg  Pulse 60  Ht '5\' 3"'$  (1.6 m)  Wt 185 lb (83.915 kg)  BMI 32.78 kg/m2 , BMI Body mass index is 32.78 kg/(m^2). GEN: Well nourished, well developed, in no acute distress HEENT: normal Neck: no JVD,  carotid bruits, or masses Cardiac: RRR; no murmurs, rubs, or gallops,no edema  Respiratory:  clear to auscultation bilaterally, normal work of breathing GI: soft, nontender, nondistended, + BS MS: no deformity or atrophy Skin: warm and dry, no rash Neuro:  Strength and sensation are intact Psych: euthymic mood, full affect   EKG:  EKG is not ordered today.   Recent Labs: 02/21/2016: ALT 19; TSH >90.000* 02/22/2016: BUN 26*; Creatinine, Ser 1.56*; Hemoglobin 9.6*; Platelets 326; Potassium 3.6; Sodium 138    Lipid Panel    Component Value Date/Time   CHOL 200 02/22/2016 0024   TRIG 77 02/22/2016 0024   HDL 58 02/22/2016  0024   CHOLHDL 3.4 02/22/2016 0024   VLDL 15 02/22/2016 0024   LDLCALC 127* 02/22/2016 0024      Wt Readings from Last 3 Encounters:  03/13/16 185 lb (83.915 kg)  02/22/16 183 lb 11.2 oz (83.326 kg)  02/21/16 187 lb 6.4 oz (85.004 kg)      Other studies Reviewed: Additional studies/ records that were reviewed today include:   CTA of chest and abdomen 02/21/2016 IMPRESSION: Negative for aortic dissection or aneurysm. Diffuse atherosclerotic disease involving the aorta and visceral arteries. In particular, there is significant plaque in the posterior aortic arch and proximal descending thoracic aorta with foci of ulcerative plaque.  Bilateral renal artery stenosis. Renal artery stenosis may be hemodynamically significant.  Indeterminate low-density areas in the spleen. Unclear if this is a perfusion abnormality or a true lesion. This could be more definitively evaluated with MRI.  Both kidneys are very lobulated and some this may be related to scarring, particularly in the right kidney. Evidence for a nonobstructive right kidney stone.  4 mm nodular density along the right lung fissures as described. If the patient is at high risk for bronchogenic carcinoma, follow-up chest CT at 1 year is recommended. If the patient is at low risk, no follow-up  is needed. This recommendation follows the consensus statement: Guidelines for Management of Small Pulmonary Nodules Detected on CT Scans: A Statement from the [employer] as published in Radiology 2005; 237:395-400.    Myoview 02/22/2016 FINDINGS: Perfusion: Mild scratch the mildly decreased inferior wall activity on both resting and stress images. No differential activity in the left ventricle on stress imaging to suggest reversible ischemia .  Wall Motion: Mild septal hypokinesis. No left ventricular dilation.  Left Ventricular Ejection Fraction: 55 %  End diastolic volume 69 ml  End systolic volume 31 ml  IMPRESSION: 1. No evidence of ischemia. Mild inferior wall attenuation without associated wall motion abnormality.  2. Mild septal hypokinesis.  3. Left ventricular ejection fraction 55%  4. Low-risk stress test findings*.    Review of the above records demonstrates:   Recent negative ischemic workup. Came in with hypertensive urgency. Found to have severely elevated TSH.    ASSESSMENT AND PLAN:  1.  Chest pain: negative myoview. Associated occasional palpitation feeling. If does not resolve, may consider event monitor. No change on EKG  2. HTN: poorly controlled, will add '30mg'$  daily Imdur to help control BP better. Will discuss with Dr. Tamala Julian to see if he wish to have this treated or reevaluate on followup  3. Hyperlipidemia: on lovastatin  4. renal artery stenosis   - originally seen on CT of abdomen. 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  5. Hypothyroidism, TSH >90, patient understand she need to followup with her PCP  6. 4 mm nodule in R lung seen on CT: need to followup with PCP   Current medicines are reviewed at length with the patient today.  The patient does not have concerns regarding  medicines.  The following changes have been made:  no change  Labs/ tests ordered today include:   Orders Placed This Encounter  Procedures  . CBC  . Basic Metabolic Panel (BMET)     Disposition:   FU with Dr. Tamala Julian in 2 months  Signed, [name], [place]  03/13/2016 6:33 PM    [facility] [address], [place], [place]  [address] Phone: [phone]; Fax: [phone]

## 2016-03-13 NOTE — Patient Instructions (Signed)
Medication Instructions:  Your physician has recommended you make the following change in your medication:  1.  START the Imdur 30 mg taking 1 tablet daily   Labwork: TODAY:  BMET & CBC  Testing/Procedures: None ordered  Follow-Up: Your physician recommends that you schedule a follow-up appointment in: 2 [place]   Any Other Special Instructions Will Be Listed Below (If Applicable).     If you need a refill on your cardiac medications before your next appointment, please call your pharmacy.

## 2016-03-14 ENCOUNTER — Telehealth: Payer: Self-pay | Admitting: Physician Assistant

## 2016-03-14 NOTE — Telephone Encounter (Signed)
Reviewed recent follow-up notes from Cardiology. TSH checked in hospital and noted to be very high. I want to see her for follow-up in office ASAP for further discussion of these results and next steps to get this level back to normal. I am concerned she is not taking the medication as directed or not taking correctly giving such a high level of TSH with taking a high dose of thyroid medication. Please schedule her to see me this week.

## 2016-03-16 ENCOUNTER — Telehealth: Payer: Self-pay | Admitting: *Deleted

## 2016-03-16 NOTE — Telephone Encounter (Signed)
Called pt and notified her of results and scheduled appt for 03/20/16 at 10:00am. Pt was unable to come in any sooner for appt.

## 2016-03-16 NOTE — Telephone Encounter (Signed)
Called pt per [name], PA-C and advised her that Carolyn Myers discussed with Dr. Tamala Julian regarding the >60% renal artery stenosis, his recommendation is to try to medically manage blood pressure, if blood pressure still uncontrolled, will consider renal artery stent later. Continue current medical therapy  Pt verbalized understanding.

## 2016-03-20 ENCOUNTER — Encounter: Payer: Self-pay | Admitting: Physician Assistant

## 2016-03-20 ENCOUNTER — Other Ambulatory Visit: Payer: Self-pay | Admitting: Physician Assistant

## 2016-03-20 ENCOUNTER — Ambulatory Visit (INDEPENDENT_AMBULATORY_CARE_PROVIDER_SITE_OTHER): Payer: Medicare Other | Admitting: Physician Assistant

## 2016-03-20 ENCOUNTER — Telehealth: Payer: Self-pay | Admitting: Physician Assistant

## 2016-03-20 VITALS — BP 158/64 | HR 53 | Temp 98.1°F | Ht 63.0 in | Wt 183.0 lb

## 2016-03-20 DIAGNOSIS — E039 Hypothyroidism, unspecified: Secondary | ICD-10-CM | POA: Diagnosis not present

## 2016-03-20 DIAGNOSIS — E038 Other specified hypothyroidism: Secondary | ICD-10-CM

## 2016-03-20 LAB — T4, FREE: Free T4: 1.51 ng/dL (ref 0.60–1.60)

## 2016-03-20 LAB — TSH: TSH: 0.42 u[IU]/mL (ref 0.35–4.50)

## 2016-03-20 MED ORDER — LEVOTHYROXINE SODIUM 200 MCG PO TABS: ORAL_TABLET | ORAL | Status: DC

## 2016-03-20 MED ORDER — LEVOTHYROXINE SODIUM 25 MCG PO TABS: 25.0000 ug | ORAL_TABLET | Freq: Every day | ORAL | Status: DC

## 2016-03-20 NOTE — Progress Notes (Signed)
Patient presents to clinic today for follow-up of worsening elevation of TSH noted during recent hospital admission. TSH noted to be > 90. Patient is currently on a regimen of Synthroid 200 mcg daily. Endorses she takes daily as directed, 2 hours before meals and other medications. Denies constipation but noted dry skin. Denies AMS or changes to mood or behavior. Has never seen Endocrinology.  Past Medical History  Diagnosis Date  . Chicken pox   . Diverticulitis   . Hyperlipidemia   . Hypertension   . Gout     Right Foot  . Hypothyroidism   . Palpitations     "doctor thought it was related to my thyroid"  . Sleep apnea     "I don't wear my mask" (02/21/2016)  . History of blood transfusion     "said my blood was low"  . Frequent headaches     "maybe twice/week" (03/12/2016)  . Migraine     "maybe twice/year" (02/21/2016)  . Arthritis     "minor in my shoulders" (02/21/2016)  . History of gout     Current Outpatient Prescriptions on File Prior to Visit  Medication Sig Dispense Refill  . acetaminophen (TYLENOL) 325 MG tablet Take 650 mg by mouth every 6 (six) hours as needed (pain).     Marland Kitchen allopurinol (ZYLOPRIM) 100 MG tablet Take 2 tablets (200 mg total) by mouth daily. (Patient taking differently: Take 100 mg by mouth 2 (two) times daily as needed. ) 60 tablet 5  . amLODipine (NORVASC) 10 MG tablet TAKE 1 TABLET BY MOUTH EVERY DAY 30 tablet 3  . aspirin EC 81 MG EC tablet Take 1 tablet (81 mg total) by mouth daily.    Marland Kitchen atenolol (TENORMIN) 25 MG tablet Take 25 mg by mouth daily at 12 noon.   3  . hydrALAZINE (APRESOLINE) 50 MG tablet Take 1 tablet (50 mg total) by mouth 3 (three) times daily. 90 tablet 11  . isosorbide mononitrate (IMDUR) 30 MG 24 hr tablet Take 1 tablet (30 mg total) by mouth daily. 90 tablet 3  . levothyroxine (SYNTHROID, LEVOTHROID) 200 MCG tablet TAKE 1 TABLET BY MOUTH EVERY DAY BEFORE BREAKFAST 30 tablet 3  . lovastatin (MEVACOR) 20 MG tablet Take 20 mg by  mouth at bedtime.   3  . meclizine (ANTIVERT) 25 MG tablet Take 25 mg by mouth 3 (three) times daily as needed for dizziness. Reported on 03/20/2016    . nitroGLYCERIN (NITROSTAT) 0.4 MG SL tablet Place 1 tablet (0.4 mg total) under the tongue every 5 (five) minutes x 3 doses as needed for chest pain. 25 tablet 12  . OVER THE COUNTER MEDICATION Place 1 drop into both eyes daily as needed (dry eyes). Over the counter lubricating eye drops    . pantoprazole (PROTONIX) 40 MG tablet Take 1 tablet (40 mg total) by mouth daily at 6 (six) AM. 30 tablet 11   No current facility-administered medications on file prior to visit.    Allergies  Allergen Reactions  . Pineapple Swelling    Reaction to fresh pineapple - tongue swelling    Family History  Problem Relation Age of Onset  . Heart disease Mother   . Heart attack Mother     died from it  . Hypertension Mother   . Arthritis Mother   . Stroke Father   . Cancer Maternal Grandmother   . Heart attack Maternal Aunt   . Renal Disease Maternal Aunt   . Multiple  myeloma Maternal Uncle   . Emphysema Maternal Uncle   . Heart disease Sister   . Thyroid disease Sister   . Healthy Son     x3  . Healthy Daughter     x5    Social History   Social History  . Marital Status: Widowed    Spouse Name: N/A  . Number of Children: N/A  . Years of Education: N/A   Occupational History  . Retired    Social History Main Topics  . Smoking status: Never Smoker   . Smokeless tobacco: Never Used  . Alcohol Use: No  . Drug Use: No  . Sexual Activity: No   Other Topics Concern  . None   Social History Narrative   Lives alone, family helps in her care.    Review of Systems - See HPI.  All other ROS are negative.  BP 158/64 mmHg  Pulse 53  Temp(Src) 98.1 F (36.7 C) (Oral)  Ht _0  (1.6 m)  Wt 183 lb (83.008 kg)  BMI 32.43 kg/m2  SpO2 98%  Physical Exam  Constitutional: She is oriented to person, place, and time and well-developed,  well-nourished, and in no distress.  HENT:  Head: Normocephalic and atraumatic.  Eyes: Conjunctivae are normal. Pupils are equal, round, and reactive to light.  Neck: Neck supple. No thyromegaly present.  Cardiovascular: Normal rate, regular rhythm, normal heart sounds and intact distal pulses.   Pulmonary/Chest: Effort normal and breath sounds normal. No respiratory distress. She has no wheezes. She has no rales. She exhibits no tenderness.  Neurological: She is alert and oriented to person, place, and time.  Skin: Skin is warm.  Xerotic skin noted  Psychiatric: Affect normal.  Vitals reviewed.   Recent Results (from the past 2160 hour(s))  Comprehensive metabolic panel     Status: Abnormal   Collection Time: 02/21/16 10:55 AM  Result Value Ref Range   Sodium 137 135 - 145 mmol/L   Potassium 3.7 3.5 - 5.1 mmol/L   Chloride 105 101 - 111 mmol/L   CO2 24 22 - 32 mmol/L   Glucose, Bld 83 65 - 99 mg/dL   BUN 25 (H) 6 - 20 mg/dL   Creatinine, Ser 1.24 (H) 0.44 - 1.00 mg/dL   Calcium 9.3 8.9 - 10.3 mg/dL   Total Protein 8.3 (H) 6.5 - 8.1 g/dL   Albumin 4.2 3.5 - 5.0 g/dL   AST 21 15 - 41 U/L   ALT 20 14 - 54 U/L   Alkaline Phosphatase 116 38 - 126 U/L   Total Bilirubin 0.5 0.3 - 1.2 mg/dL   GFR calc non Af Amer 41 (L) >60 mL/min   GFR calc Af Amer 47 (L) >60 mL/min    Comment: (NOTE) The eGFR has been calculated using the CKD EPI equation. This calculation has not been validated in all clinical situations. eGFR's persistently <60 mL/min signify possible Chronic Kidney Disease.    Anion gap 8 5 - 15  CBC with Differential/Platelet     Status: Abnormal   Collection Time: 02/21/16 10:55 AM  Result Value Ref Range   WBC 9.9 4.0 - 10.5 K/uL   RBC 4.31 3.87 - 5.11 MIL/uL   Hemoglobin 11.5 (L) 12.0 - 15.0 g/dL   HCT 35.1 (L) 36.0 - 46.0 %   MCV 81.4 78.0 - 100.0 fL   MCH 26.7 26.0 - 34.0 pg   MCHC 32.8 30.0 - 36.0 g/dL   RDW 14.9 11.5 - 15.5 %  Platelets 386 150 - 400 K/uL    Neutrophils Relative % 52 %   Neutro Abs 5.2 1.7 - 7.7 K/uL   Lymphocytes Relative 31 %   Lymphs Abs 3.0 0.7 - 4.0 K/uL   Monocytes Relative 12 %   Monocytes Absolute 1.2 (H) 0.1 - 1.0 K/uL   Eosinophils Relative 5 %   Eosinophils Absolute 0.5 0.0 - 0.7 K/uL   Basophils Relative 0 %   Basophils Absolute 0.0 0.0 - 0.1 K/uL  Troponin I     Status: Abnormal   Collection Time: 02/21/16 10:55 AM  Result Value Ref Range   Troponin I 0.09 (H) <0.031 ng/mL    Comment:        PERSISTENTLY INCREASED TROPONIN VALUES IN THE RANGE OF 0.04-0.49 ng/mL CAN BE SEEN IN:       -UNSTABLE ANGINA       -CONGESTIVE HEART FAILURE       -MYOCARDITIS       -CHEST TRAUMA       -ARRYHTHMIAS       -LATE PRESENTING MYOCARDIAL INFARCTION       -COPD   CLINICAL FOLLOW-UP RECOMMENDED.   Urinalysis, Routine w reflex microscopic (not at [facility])     Status: None   Collection Time: 02/21/16 12:40 PM  Result Value Ref Range   Color, Urine YELLOW YELLOW   APPearance CLEAR CLEAR   Specific Gravity, Urine 1.017 1.005 - 1.030   pH 6.0 5.0 - 8.0   Glucose, UA NEGATIVE NEGATIVE mg/dL   Hgb urine dipstick NEGATIVE NEGATIVE   Bilirubin Urine NEGATIVE NEGATIVE   Ketones, ur NEGATIVE NEGATIVE mg/dL   Protein, ur NEGATIVE NEGATIVE mg/dL   Nitrite NEGATIVE NEGATIVE   Leukocytes, UA NEGATIVE NEGATIVE    Comment: MICROSCOPIC NOT DONE ON URINES WITH NEGATIVE PROTEIN, BLOOD, LEUKOCYTES, NITRITE, OR GLUCOSE <1000 mg/dL.  MRSA PCR Screening     Status: None   Collection Time: 02/21/16  5:06 PM  Result Value Ref Range   MRSA by PCR NEGATIVE NEGATIVE    Comment:        The GeneXpert MRSA Assay (FDA approved for NASAL specimens only), is one component of a comprehensive MRSA colonization surveillance program. It is not intended to diagnose MRSA infection nor to guide or monitor treatment for MRSA infections.   TSH     Status: Abnormal   Collection Time: 02/21/16  6:31 PM  Result Value Ref Range   TSH >90.000 (H)  0.350 - 4.500 uIU/mL  Troponin I     Status: Abnormal   Collection Time: 02/21/16  6:31 PM  Result Value Ref Range   Troponin I 0.10 (H) <0.031 ng/mL    Comment:        PERSISTENTLY INCREASED TROPONIN VALUES IN THE RANGE OF 0.04-0.49 ng/mL CAN BE SEEN IN:       -UNSTABLE ANGINA       -CONGESTIVE HEART FAILURE       -MYOCARDITIS       -CHEST TRAUMA       -ARRYHTHMIAS       -LATE PRESENTING MYOCARDIAL INFARCTION       -COPD   CLINICAL FOLLOW-UP RECOMMENDED.   Hepatic function panel     Status: Abnormal   Collection Time: 02/21/16  6:31 PM  Result Value Ref Range   Total Protein 8.0 6.5 - 8.1 g/dL   Albumin 3.8 3.5 - 5.0 g/dL   AST 46 (H) 15 - 41 U/L   ALT 19 14 -  54 U/L   Alkaline Phosphatase 100 38 - 126 U/L   Total Bilirubin 0.5 0.3 - 1.2 mg/dL   Bilirubin, Direct <0.1 (L) 0.1 - 0.5 mg/dL   Indirect Bilirubin NOT CALCULATED 0.3 - 0.9 mg/dL  Hemoglobin A1c     Status: Abnormal   Collection Time: 02/21/16  6:31 PM  Result Value Ref Range   Hgb A1c MFr Bld 5.8 (H) 4.8 - 5.6 %    Comment: (NOTE)         Pre-diabetes: 5.7 - 6.4         Diabetes: >6.4         Glycemic control for adults with diabetes: <7.0    Mean Plasma Glucose 120 mg/dL    Comment: (NOTE) Performed At: [facility] [place], [place] [id number] Lindon Romp MD [id number]   Protime-INR     Status: None   Collection Time: 02/21/16  6:31 PM  Result Value Ref Range   Prothrombin Time 13.5 11.6 - 15.2 seconds   INR 1.01 0.00 - 1.49  Heparin level (unfractionated)     Status: Abnormal   Collection Time: 02/21/16 10:54 PM  Result Value Ref Range   Heparin Unfractionated 0.71 (H) 0.30 - 0.70 IU/mL    Comment:        IF HEPARIN RESULTS ARE BELOW EXPECTED VALUES, AND PATIENT DOSAGE HAS BEEN CONFIRMED, SUGGEST FOLLOW UP TESTING OF ANTITHROMBIN III LEVELS.   Troponin I     Status: Abnormal   Collection Time: 02/22/16 12:24 AM  Result Value Ref Range   Troponin I 0.10 (H)  <0.031 ng/mL    Comment:        PERSISTENTLY INCREASED TROPONIN VALUES IN THE RANGE OF 0.04-0.49 ng/mL CAN BE SEEN IN:       -UNSTABLE ANGINA       -CONGESTIVE HEART FAILURE       -MYOCARDITIS       -CHEST TRAUMA       -ARRYHTHMIAS       -LATE PRESENTING MYOCARDIAL INFARCTION       -COPD   CLINICAL FOLLOW-UP RECOMMENDED.   Lipid panel     Status: Abnormal   Collection Time: 02/22/16 12:24 AM  Result Value Ref Range   Cholesterol 200 0 - 200 mg/dL   Triglycerides 77 <150 mg/dL   HDL 58 >40 mg/dL   Total CHOL/HDL Ratio 3.4 RATIO   VLDL 15 0 - 40 mg/dL   LDL Cholesterol 127 (H) 0 - 99 mg/dL    Comment:        Total Cholesterol/HDL:CHD Risk Coronary Heart Disease Risk Table                     Men   Women  1/2 Average Risk   3.4   3.3  Average Risk       5.0   4.4  2 X Average Risk   9.6   7.1  3 X Average Risk  23.4   11.0        Use the calculated Patient Ratio above and the CHD Risk Table to determine the patient's CHD Risk.        ATP III CLASSIFICATION (LDL):  <100     mg/dL   Optimal  100-129  mg/dL   Near or Above                    Optimal  130-159  mg/dL   Borderline  160-189  mg/dL   High  >190     mg/dL   Very High   CBC     Status: Abnormal   Collection Time: 02/22/16  7:28 AM  Result Value Ref Range   WBC 9.5 4.0 - 10.5 K/uL   RBC 3.80 (L) 3.87 - 5.11 MIL/uL   Hemoglobin 9.6 (L) 12.0 - 15.0 g/dL   HCT 30.6 (L) 36.0 - 46.0 %   MCV 80.5 78.0 - 100.0 fL   MCH 25.3 (L) 26.0 - 34.0 pg   MCHC 31.4 30.0 - 36.0 g/dL   RDW 15.3 11.5 - 15.5 %   Platelets 326 150 - 400 K/uL  Troponin I     Status: Abnormal   Collection Time: 02/22/16  7:28 AM  Result Value Ref Range   Troponin I 0.07 (H) <0.031 ng/mL    Comment:        PERSISTENTLY INCREASED TROPONIN VALUES IN THE RANGE OF 0.04-0.49 ng/mL CAN BE SEEN IN:       -UNSTABLE ANGINA       -CONGESTIVE HEART FAILURE       -MYOCARDITIS       -CHEST TRAUMA       -ARRYHTHMIAS       -LATE PRESENTING MYOCARDIAL  INFARCTION       -COPD   CLINICAL FOLLOW-UP RECOMMENDED.   Basic metabolic panel     Status: Abnormal   Collection Time: 02/22/16  7:28 AM  Result Value Ref Range   Sodium 138 135 - 145 mmol/L   Potassium 3.6 3.5 - 5.1 mmol/L   Chloride 106 101 - 111 mmol/L   CO2 23 22 - 32 mmol/L   Glucose, Bld 108 (H) 65 - 99 mg/dL   BUN 26 (H) 6 - 20 mg/dL   Creatinine, Ser 1.56 (H) 0.44 - 1.00 mg/dL   Calcium 9.1 8.9 - 10.3 mg/dL   GFR calc non Af Amer 31 (L) >60 mL/min   GFR calc Af Amer 36 (L) >60 mL/min    Comment: (NOTE) The eGFR has been calculated using the CKD EPI equation. This calculation has not been validated in all clinical situations. eGFR's persistently <60 mL/min signify possible Chronic Kidney Disease.    Anion gap 9 5 - 15  Heparin level (unfractionated)     Status: None   Collection Time: 02/22/16  7:28 AM  Result Value Ref Range   Heparin Unfractionated 0.47 0.30 - 0.70 IU/mL    Comment:        IF HEPARIN RESULTS ARE BELOW EXPECTED VALUES, AND PATIENT DOSAGE HAS BEEN CONFIRMED, SUGGEST FOLLOW UP TESTING OF ANTITHROMBIN III LEVELS.   NM Myocar Multi W/Spect W/Wall Motion / EF     Status: None   Collection Time: 02/22/16  1:15 PM  Result Value Ref Range   Rest HR 73 bpm   Rest BP 164/90 mmHg   Exercise duration (min)  min   Exercise duration (sec)  sec   Estimated workload  METS   Peak HR 96 bpm   Peak BP 181/90 mmHg   MPHR  bpm   Percent HR  %   RPE     LV sys vol  mL   TID     LV dias vol  mL   LHR     SSS     SRS     SDS    Heparin level (unfractionated)     Status: None   Collection Time: 02/22/16  3:19 PM  Result Value Ref Range   Heparin Unfractionated 0.35 0.30 - 0.70 IU/mL    Comment:        IF HEPARIN RESULTS ARE BELOW EXPECTED VALUES, AND PATIENT DOSAGE HAS BEEN CONFIRMED, SUGGEST FOLLOW UP TESTING OF ANTITHROMBIN III LEVELS.   CBC     Status: Abnormal   Collection Time: 03/13/16 11:49 AM  Result Value Ref Range   WBC 7.5 4.0 - 10.5  K/uL   RBC 4.03 3.87 - 5.11 MIL/uL   Hemoglobin 10.5 (L) 12.0 - 15.0 g/dL   HCT 32.3 (L) 36.0 - 46.0 %   MCV 80.1 78.0 - 100.0 fL   MCH 26.1 26.0 - 34.0 pg   MCHC 32.5 30.0 - 36.0 g/dL   RDW 15.3 11.5 - 15.5 %   Platelets 369 150 - 400 K/uL   MPV 8.8 8.6 - 12.4 fL  Basic Metabolic Panel (BMET)     Status: Abnormal   Collection Time: 03/13/16 11:49 AM  Result Value Ref Range   Sodium 140 135 - 146 mmol/L   Potassium 4.0 3.5 - 5.3 mmol/L   Chloride 106 98 - 110 mmol/L   CO2 24 20 - 31 mmol/L   Glucose, Bld 123 (H) 65 - 99 mg/dL   BUN 26 (H) 7 - 25 mg/dL   Creat 1.33 (H) 0.60 - 0.93 mg/dL   Calcium 9.8 8.6 - 10.4 mg/dL    Assessment/Plan: Hypothyroidism Severe but with mild symptoms. Patient is compliant with medications. Will repeat TSH and get a free T4. Increase Synthroid to 225 mcg. Urgent referral placed to Endocrinology.

## 2016-03-20 NOTE — Telephone Encounter (Signed)
Spoke with patient's daughter -- she is to take only 200 mcg Synthroid and not the extra 25 mcg since repeat TSH was normal (down from > 90 4 weeks ago). Discuss this is likely due to her finally taking the medication as directed since hospital admission. Will follow-up 4 weeks for repeat TSH -- may actually be able to decrease dose in the future.

## 2016-03-20 NOTE — Progress Notes (Signed)
Pre visit review using our clinic review tool, if applicable. No additional management support is needed unless otherwise documented below in the visit note. 

## 2016-03-20 NOTE — Assessment & Plan Note (Signed)
Severe but with mild symptoms. Patient is compliant with medications. Will repeat TSH and get a free T4. Increase Synthroid to 225 mcg. Urgent referral placed to Endocrinology.

## 2016-03-20 NOTE — Patient Instructions (Signed)
Please go to the lab for blood work. I will call with your results. You will be contacted by Endocrinology for further assessment.  Please start the new dose of Levothyroxine (25 mcg daily) in addition to your current 200 mcg tablet. Take when you wake up in the morning.   Continue other medications as directed. Follow-up with me in 2 weeks if you have not seen the Endocrinologist.  Do not forget to use Sarna lotion as directed for itchy skin.

## 2016-03-20 NOTE — Telephone Encounter (Signed)
Caller name: Ellsa Relationship to patient: Daughter Can be reached: [phone] Pharmacy:  Reason for call: Daughter called stating that patient has confused her medications and does not know what she needs to be taking. States that patient was told to stop some meds and stat some new ones and to double up on some, but she is confused about which is which. Plse call daughter.

## 2016-04-10 ENCOUNTER — Ambulatory Visit (INDEPENDENT_AMBULATORY_CARE_PROVIDER_SITE_OTHER): Payer: Medicare Other | Admitting: Physician Assistant

## 2016-04-10 ENCOUNTER — Encounter: Payer: Self-pay | Admitting: Physician Assistant

## 2016-04-10 VITALS — BP 142/90 | HR 86 | Temp 98.8°F | Ht 63.0 in | Wt 179.8 lb

## 2016-04-10 DIAGNOSIS — I701 Atherosclerosis of renal artery: Secondary | ICD-10-CM | POA: Diagnosis not present

## 2016-04-10 DIAGNOSIS — E039 Hypothyroidism, unspecified: Secondary | ICD-10-CM | POA: Diagnosis not present

## 2016-04-10 LAB — BASIC METABOLIC PANEL
BUN: 23 mg/dL (ref 6–23)
CO2: 26 mEq/L (ref 19–32)
CREATININE: 1.42 mg/dL — AB (ref 0.40–1.20)
Calcium: 10.2 mg/dL (ref 8.4–10.5)
Chloride: 105 mEq/L (ref 96–112)
GFR: 46.06 mL/min — AB (ref >60.00)
Glucose, Bld: 105 mg/dL — ABNORMAL HIGH (ref 70–99)
POTASSIUM: 3.6 meq/L (ref 3.5–5.1)
Sodium: 140 mEq/L (ref 135–145)

## 2016-04-10 LAB — TSH: TSH: 4.29 u[IU]/mL (ref 0.35–4.50)

## 2016-04-10 NOTE — Progress Notes (Signed)
Patient presents to clinic today for follow-up of hypothyroidism. Patient has had significantly labile TSH levels, mainly due to prior non-compliance. Endorses she has continued to take thyroid medication as directed since last visit. Endorses good passage of stools. Denies cold intolerance or dry skin.   Past Medical History  Diagnosis Date  . Chicken pox   . Diverticulitis   . Hyperlipidemia   . Hypertension   . Gout     Right Foot  . Hypothyroidism   . Palpitations     "doctor thought it was related to my thyroid"  . Sleep apnea     "I don't wear my mask" (02/21/2016)  . History of blood transfusion     "said my blood was low"  . Frequent headaches     "maybe twice/week" (03/12/2016)  . Migraine     "maybe twice/year" (02/21/2016)  . Arthritis     "minor in my shoulders" (02/21/2016)  . History of gout     Current Outpatient Prescriptions on File Prior to Visit  Medication Sig Dispense Refill  . acetaminophen (TYLENOL) 325 MG tablet Take 650 mg by mouth every 6 (six) hours as needed (pain).     Marland Kitchen amLODipine (NORVASC) 10 MG tablet TAKE 1 TABLET BY MOUTH EVERY DAY 30 tablet 3  . aspirin EC 81 MG EC tablet Take 1 tablet (81 mg total) by mouth daily.    Marland Kitchen atenolol (TENORMIN) 25 MG tablet Take 25 mg by mouth daily at 12 noon.   3  . hydrALAZINE (APRESOLINE) 50 MG tablet Take 1 tablet (50 mg total) by mouth 3 (three) times daily. 90 tablet 11  . isosorbide mononitrate (IMDUR) 30 MG 24 hr tablet Take 1 tablet (30 mg total) by mouth daily. 90 tablet 3  . lovastatin (MEVACOR) 20 MG tablet Take 20 mg by mouth at bedtime.   3  . meclizine (ANTIVERT) 25 MG tablet Take 25 mg by mouth 3 (three) times daily as needed for dizziness. Reported on 03/20/2016    . nitroGLYCERIN (NITROSTAT) 0.4 MG SL tablet Place 1 tablet (0.4 mg total) under the tongue every 5 (five) minutes x 3 doses as needed for chest pain. 25 tablet 12  . OVER THE COUNTER MEDICATION Place 1 drop into both eyes daily as needed  (dry eyes). Over the counter lubricating eye drops    . pantoprazole (PROTONIX) 40 MG tablet Take 1 tablet (40 mg total) by mouth daily at 6 (six) AM. 30 tablet 11   No current facility-administered medications on file prior to visit.    Allergies  Allergen Reactions  . Pineapple Swelling    Reaction to fresh pineapple - tongue swelling    Family History  Problem Relation Age of Onset  . Heart disease Mother   . Heart attack Mother     died from it  . Hypertension Mother   . Arthritis Mother   . Stroke Father   . Cancer Maternal Grandmother   . Heart attack Maternal Aunt   . Renal Disease Maternal Aunt   . Multiple myeloma Maternal Uncle   . Emphysema Maternal Uncle   . Heart disease Sister   . Thyroid disease Sister   . Healthy Son     x3  . Healthy Daughter     x5    Social History   Social History  . Marital Status: Widowed    Spouse Name: N/A  . Number of Children: N/A  . Years of Education: N/A  Occupational History  . Retired    Social History Main Topics  . Smoking status: Never Smoker   . Smokeless tobacco: Never Used  . Alcohol Use: No  . Drug Use: No  . Sexual Activity: No   Other Topics Concern  . None   Social History Narrative   Lives alone, family helps in her care.   Review of Systems - See HPI.  All other ROS are negative.  BP 142/90 mmHg  Pulse 86  Temp(Src) 98.8 F (37.1 C) (Oral)  Ht '5\' 3"'$  (1.6 m)  Wt 179 lb 12.8 oz (81.557 kg)  BMI 31.86 kg/m2  SpO2 98%  Physical Exam  Constitutional: She is oriented to person, place, and time and well-developed, well-nourished, and in no distress.  HENT:  Head: Normocephalic and atraumatic.  Eyes: Conjunctivae are normal.  Cardiovascular: Normal rate, regular rhythm, normal heart sounds and intact distal pulses.   Pulmonary/Chest: Effort normal and breath sounds normal. No respiratory distress. She has no wheezes. She has no rales. She exhibits no tenderness.  Neurological: She is alert  and oriented to person, place, and time.  Skin: Skin is warm and dry. No rash noted.  Psychiatric: Affect normal.  Vitals reviewed.   Recent Results (from the past 2160 hour(s))  Comprehensive metabolic panel     Status: Abnormal   Collection Time: 02/21/16 10:55 AM  Result Value Ref Range   Sodium 137 135 - 145 mmol/L   Potassium 3.7 3.5 - 5.1 mmol/L   Chloride 105 101 - 111 mmol/L   CO2 24 22 - 32 mmol/L   Glucose, Bld 83 65 - 99 mg/dL   BUN 25 (H) 6 - 20 mg/dL   Creatinine, Ser 1.24 (H) 0.44 - 1.00 mg/dL   Calcium 9.3 8.9 - 10.3 mg/dL   Total Protein 8.3 (H) 6.5 - 8.1 g/dL   Albumin 4.2 3.5 - 5.0 g/dL   AST 21 15 - 41 U/L   ALT 20 14 - 54 U/L   Alkaline Phosphatase 116 38 - 126 U/L   Total Bilirubin 0.5 0.3 - 1.2 mg/dL   GFR calc non Af Amer 41 (L) >60 mL/min   GFR calc Af Amer 47 (L) >60 mL/min    Comment: (NOTE) The eGFR has been calculated using the CKD EPI equation. This calculation has not been validated in all clinical situations. eGFR's persistently <60 mL/min signify possible Chronic Kidney Disease.    Anion gap 8 5 - 15  CBC with Differential/Platelet     Status: Abnormal   Collection Time: 02/21/16 10:55 AM  Result Value Ref Range   WBC 9.9 4.0 - 10.5 K/uL   RBC 4.31 3.87 - 5.11 MIL/uL   Hemoglobin 11.5 (L) 12.0 - 15.0 g/dL   HCT 35.1 (L) 36.0 - 46.0 %   MCV 81.4 78.0 - 100.0 fL   MCH 26.7 26.0 - 34.0 pg   MCHC 32.8 30.0 - 36.0 g/dL   RDW 14.9 11.5 - 15.5 %   Platelets 386 150 - 400 K/uL   Neutrophils Relative % 52 %   Neutro Abs 5.2 1.7 - 7.7 K/uL   Lymphocytes Relative 31 %   Lymphs Abs 3.0 0.7 - 4.0 K/uL   Monocytes Relative 12 %   Monocytes Absolute 1.2 (H) 0.1 - 1.0 K/uL   Eosinophils Relative 5 %   Eosinophils Absolute 0.5 0.0 - 0.7 K/uL   Basophils Relative 0 %   Basophils Absolute 0.0 0.0 - 0.1 K/uL  Troponin  I     Status: Abnormal   Collection Time: 02/21/16 10:55 AM  Result Value Ref Range   Troponin I 0.09 (H) <0.031 ng/mL    Comment:         PERSISTENTLY INCREASED TROPONIN VALUES IN THE RANGE OF 0.04-0.49 ng/mL CAN BE SEEN IN:       -UNSTABLE ANGINA       -CONGESTIVE HEART FAILURE       -MYOCARDITIS       -CHEST TRAUMA       -ARRYHTHMIAS       -LATE PRESENTING MYOCARDIAL INFARCTION       -COPD   CLINICAL FOLLOW-UP RECOMMENDED.   Urinalysis, Routine w reflex microscopic (not at [facility])     Status: None   Collection Time: 02/21/16 12:40 PM  Result Value Ref Range   Color, Urine YELLOW YELLOW   APPearance CLEAR CLEAR   Specific Gravity, Urine 1.017 1.005 - 1.030   pH 6.0 5.0 - 8.0   Glucose, UA NEGATIVE NEGATIVE mg/dL   Hgb urine dipstick NEGATIVE NEGATIVE   Bilirubin Urine NEGATIVE NEGATIVE   Ketones, ur NEGATIVE NEGATIVE mg/dL   Protein, ur NEGATIVE NEGATIVE mg/dL   Nitrite NEGATIVE NEGATIVE   Leukocytes, UA NEGATIVE NEGATIVE    Comment: MICROSCOPIC NOT DONE ON URINES WITH NEGATIVE PROTEIN, BLOOD, LEUKOCYTES, NITRITE, OR GLUCOSE <1000 mg/dL.  MRSA PCR Screening     Status: None   Collection Time: 02/21/16  5:06 PM  Result Value Ref Range   MRSA by PCR NEGATIVE NEGATIVE    Comment:        The GeneXpert MRSA Assay (FDA approved for NASAL specimens only), is one component of a comprehensive MRSA colonization surveillance program. It is not intended to diagnose MRSA infection nor to guide or monitor treatment for MRSA infections.   TSH     Status: Abnormal   Collection Time: 02/21/16  6:31 PM  Result Value Ref Range   TSH >90.000 (H) 0.350 - 4.500 uIU/mL  Troponin I     Status: Abnormal   Collection Time: 02/21/16  6:31 PM  Result Value Ref Range   Troponin I 0.10 (H) <0.031 ng/mL    Comment:        PERSISTENTLY INCREASED TROPONIN VALUES IN THE RANGE OF 0.04-0.49 ng/mL CAN BE SEEN IN:       -UNSTABLE ANGINA       -CONGESTIVE HEART FAILURE       -MYOCARDITIS       -CHEST TRAUMA       -ARRYHTHMIAS       -LATE PRESENTING MYOCARDIAL INFARCTION       -COPD   CLINICAL FOLLOW-UP RECOMMENDED.     Hepatic function panel     Status: Abnormal   Collection Time: 02/21/16  6:31 PM  Result Value Ref Range   Total Protein 8.0 6.5 - 8.1 g/dL   Albumin 3.8 3.5 - 5.0 g/dL   AST 46 (H) 15 - 41 U/L   ALT 19 14 - 54 U/L   Alkaline Phosphatase 100 38 - 126 U/L   Total Bilirubin 0.5 0.3 - 1.2 mg/dL   Bilirubin, Direct <0.1 (L) 0.1 - 0.5 mg/dL   Indirect Bilirubin NOT CALCULATED 0.3 - 0.9 mg/dL  Hemoglobin A1c     Status: Abnormal   Collection Time: 02/21/16  6:31 PM  Result Value Ref Range   Hgb A1c MFr Bld 5.8 (H) 4.8 - 5.6 %    Comment: (NOTE)  Pre-diabetes: 5.7 - 6.4         Diabetes: >6.4         Glycemic control for adults with diabetes: <7.0    Mean Plasma Glucose 120 mg/dL    Comment: (NOTE) Performed At: [facility] [place], [place] [id number] Lindon Romp MD [id number]   Protime-INR     Status: None   Collection Time: 02/21/16  6:31 PM  Result Value Ref Range   Prothrombin Time 13.5 11.6 - 15.2 seconds   INR 1.01 0.00 - 1.49  Heparin level (unfractionated)     Status: Abnormal   Collection Time: 02/21/16 10:54 PM  Result Value Ref Range   Heparin Unfractionated 0.71 (H) 0.30 - 0.70 IU/mL    Comment:        IF HEPARIN RESULTS ARE BELOW EXPECTED VALUES, AND PATIENT DOSAGE HAS BEEN CONFIRMED, SUGGEST FOLLOW UP TESTING OF ANTITHROMBIN III LEVELS.   Troponin I     Status: Abnormal   Collection Time: 02/22/16 12:24 AM  Result Value Ref Range   Troponin I 0.10 (H) <0.031 ng/mL    Comment:        PERSISTENTLY INCREASED TROPONIN VALUES IN THE RANGE OF 0.04-0.49 ng/mL CAN BE SEEN IN:       -UNSTABLE ANGINA       -CONGESTIVE HEART FAILURE       -MYOCARDITIS       -CHEST TRAUMA       -ARRYHTHMIAS       -LATE PRESENTING MYOCARDIAL INFARCTION       -COPD   CLINICAL FOLLOW-UP RECOMMENDED.   Lipid panel     Status: Abnormal   Collection Time: 02/22/16 12:24 AM  Result Value Ref Range   Cholesterol 200 0 - 200 mg/dL    Triglycerides 77 <150 mg/dL   HDL 58 >40 mg/dL   Total CHOL/HDL Ratio 3.4 RATIO   VLDL 15 0 - 40 mg/dL   LDL Cholesterol 127 (H) 0 - 99 mg/dL    Comment:        Total Cholesterol/HDL:CHD Risk Coronary Heart Disease Risk Table                     Men   Women  1/2 Average Risk   3.4   3.3  Average Risk       5.0   4.4  2 X Average Risk   9.6   7.1  3 X Average Risk  23.4   11.0        Use the calculated Patient Ratio above and the CHD Risk Table to determine the patient's CHD Risk.        ATP III CLASSIFICATION (LDL):  <100     mg/dL   Optimal  100-129  mg/dL   Near or Above                    Optimal  130-159  mg/dL   Borderline  160-189  mg/dL   High  >190     mg/dL   Very High   CBC     Status: Abnormal   Collection Time: 02/22/16  7:28 AM  Result Value Ref Range   WBC 9.5 4.0 - 10.5 K/uL   RBC 3.80 (L) 3.87 - 5.11 MIL/uL   Hemoglobin 9.6 (L) 12.0 - 15.0 g/dL   HCT 30.6 (L) 36.0 - 46.0 %   MCV 80.5 78.0 - 100.0 fL   MCH 25.3 (L)  26.0 - 34.0 pg   MCHC 31.4 30.0 - 36.0 g/dL   RDW 15.3 11.5 - 15.5 %   Platelets 326 150 - 400 K/uL  Troponin I     Status: Abnormal   Collection Time: 02/22/16  7:28 AM  Result Value Ref Range   Troponin I 0.07 (H) <0.031 ng/mL    Comment:        PERSISTENTLY INCREASED TROPONIN VALUES IN THE RANGE OF 0.04-0.49 ng/mL CAN BE SEEN IN:       -UNSTABLE ANGINA       -CONGESTIVE HEART FAILURE       -MYOCARDITIS       -CHEST TRAUMA       -ARRYHTHMIAS       -LATE PRESENTING MYOCARDIAL INFARCTION       -COPD   CLINICAL FOLLOW-UP RECOMMENDED.   Basic metabolic panel     Status: Abnormal   Collection Time: 02/22/16  7:28 AM  Result Value Ref Range   Sodium 138 135 - 145 mmol/L   Potassium 3.6 3.5 - 5.1 mmol/L   Chloride 106 101 - 111 mmol/L   CO2 23 22 - 32 mmol/L   Glucose, Bld 108 (H) 65 - 99 mg/dL   BUN 26 (H) 6 - 20 mg/dL   Creatinine, Ser 1.56 (H) 0.44 - 1.00 mg/dL   Calcium 9.1 8.9 - 10.3 mg/dL   GFR calc non Af Amer 31 (L) >60  mL/min   GFR calc Af Amer 36 (L) >60 mL/min    Comment: (NOTE) The eGFR has been calculated using the CKD EPI equation. This calculation has not been validated in all clinical situations. eGFR's persistently <60 mL/min signify possible Chronic Kidney Disease.    Anion gap 9 5 - 15  Heparin level (unfractionated)     Status: None   Collection Time: 02/22/16  7:28 AM  Result Value Ref Range   Heparin Unfractionated 0.47 0.30 - 0.70 IU/mL    Comment:        IF HEPARIN RESULTS ARE BELOW EXPECTED VALUES, AND PATIENT DOSAGE HAS BEEN CONFIRMED, SUGGEST FOLLOW UP TESTING OF ANTITHROMBIN III LEVELS.   NM Myocar Multi W/Spect W/Wall Motion / EF     Status: None   Collection Time: 02/22/16  1:15 PM  Result Value Ref Range   Rest HR 73 bpm   Rest BP 164/90 mmHg   Exercise duration (min)  min   Exercise duration (sec)  sec   Estimated workload  METS   Peak HR 96 bpm   Peak BP 181/90 mmHg   MPHR  bpm   Percent HR  %   RPE     LV sys vol  mL   TID     LV dias vol  mL   LHR     SSS     SRS     SDS    Heparin level (unfractionated)     Status: None   Collection Time: 02/22/16  3:19 PM  Result Value Ref Range   Heparin Unfractionated 0.35 0.30 - 0.70 IU/mL    Comment:        IF HEPARIN RESULTS ARE BELOW EXPECTED VALUES, AND PATIENT DOSAGE HAS BEEN CONFIRMED, SUGGEST FOLLOW UP TESTING OF ANTITHROMBIN III LEVELS.   CBC     Status: Abnormal   Collection Time: 03/13/16 11:49 AM  Result Value Ref Range   WBC 7.5 4.0 - 10.5 K/uL   RBC 4.03 3.87 - 5.11 MIL/uL   Hemoglobin 10.5 (L)  12.0 - 15.0 g/dL   HCT 32.3 (L) 36.0 - 46.0 %   MCV 80.1 78.0 - 100.0 fL   MCH 26.1 26.0 - 34.0 pg   MCHC 32.5 30.0 - 36.0 g/dL   RDW 15.3 11.5 - 15.5 %   Platelets 369 150 - 400 K/uL   MPV 8.8 8.6 - 12.4 fL  Basic Metabolic Panel (BMET)     Status: Abnormal   Collection Time: 03/13/16 11:49 AM  Result Value Ref Range   Sodium 140 135 - 146 mmol/L   Potassium 4.0 3.5 - 5.3 mmol/L   Chloride 106  98 - 110 mmol/L   CO2 24 20 - 31 mmol/L   Glucose, Bld 123 (H) 65 - 99 mg/dL   BUN 26 (H) 7 - 25 mg/dL   Creat 1.33 (H) 0.60 - 0.93 mg/dL   Calcium 9.8 8.6 - 10.4 mg/dL  TSH     Status: None   Collection Time: 03/20/16 11:15 AM  Result Value Ref Range   TSH 0.42 0.35 - 4.50 uIU/mL  T4, free     Status: None   Collection Time: 03/20/16 11:15 AM  Result Value Ref Range   Free T4 1.51 0.60 - 1.60 ng/dL  TSH     Status: None   Collection Time: 04/10/16 10:36 AM  Result Value Ref Range   TSH 4.29 0.35 - 4.50 uIU/mL  Basic Metabolic Panel (BMET)     Status: Abnormal   Collection Time: 04/10/16 10:36 AM  Result Value Ref Range   Sodium 140 135 - 145 mEq/L   Potassium 3.6 3.5 - 5.1 mEq/L   Chloride 105 96 - 112 mEq/L   CO2 26 19 - 32 mEq/L   Glucose, Bld 105 (H) 70 - 99 mg/dL   BUN 23 6 - 23 mg/dL   Creatinine, Ser 1.42 (H) 0.40 - 1.20 mg/dL   Calcium 10.2 8.4 - 10.5 mg/dL   GFR 46.06 (L) >60.00 mL/min    Assessment/Plan: Hypothyroidism Will repeat TSH today to re-assess. If stable will then check in 6 months.

## 2016-04-10 NOTE — Patient Instructions (Signed)
Please go to the lab for blood work. I will call you with your results. We may even need to decrease your thyroid medication now that you are regularly taking medication. Please continue BP medications. Follow-up with Nephrology (kidney specialist) to get the stent put in. This will help with BP.

## 2016-04-10 NOTE — Progress Notes (Signed)
Pre visit review using our clinic tool,if applicable. No additional management support is needed unless otherwise documented below in the visit note.  

## 2016-04-15 ENCOUNTER — Other Ambulatory Visit: Payer: Self-pay | Admitting: Family Medicine

## 2016-04-17 ENCOUNTER — Other Ambulatory Visit: Payer: Medicare Other

## 2016-04-17 NOTE — Telephone Encounter (Signed)
Rx request to pharmacy/[facility]  

## 2016-04-19 ENCOUNTER — Other Ambulatory Visit: Payer: Self-pay | Admitting: Physician Assistant

## 2016-04-20 NOTE — Assessment & Plan Note (Signed)
Will repeat TSH today to re-assess. If stable will then check in 6 months.

## 2016-05-04 ENCOUNTER — Ambulatory Visit (INDEPENDENT_AMBULATORY_CARE_PROVIDER_SITE_OTHER): Payer: Medicare Other | Admitting: Interventional Cardiology

## 2016-05-04 DIAGNOSIS — R079 Chest pain, unspecified: Secondary | ICD-10-CM

## 2016-05-04 DIAGNOSIS — I701 Atherosclerosis of renal artery: Secondary | ICD-10-CM

## 2016-05-04 DIAGNOSIS — R911 Solitary pulmonary nodule: Secondary | ICD-10-CM

## 2016-05-04 DIAGNOSIS — N189 Chronic kidney disease, unspecified: Secondary | ICD-10-CM

## 2016-05-05 NOTE — Progress Notes (Signed)
Not show

## 2016-05-08 ENCOUNTER — Encounter: Payer: Self-pay | Admitting: Physician Assistant

## 2016-05-08 ENCOUNTER — Ambulatory Visit (INDEPENDENT_AMBULATORY_CARE_PROVIDER_SITE_OTHER): Payer: Medicare Other | Admitting: Physician Assistant

## 2016-05-08 VITALS — BP 120/50 | HR 69 | Temp 97.8°F | Ht 63.0 in | Wt 178.4 lb

## 2016-05-08 DIAGNOSIS — R799 Abnormal finding of blood chemistry, unspecified: Secondary | ICD-10-CM

## 2016-05-08 DIAGNOSIS — K219 Gastro-esophageal reflux disease without esophagitis: Secondary | ICD-10-CM

## 2016-05-08 DIAGNOSIS — E039 Hypothyroidism, unspecified: Secondary | ICD-10-CM | POA: Diagnosis not present

## 2016-05-08 LAB — BASIC METABOLIC PANEL
BUN: 28 mg/dL — AB (ref 6–23)
CHLORIDE: 107 meq/L (ref 96–112)
CO2: 23 meq/L (ref 19–32)
CREATININE: 1.45 mg/dL — AB (ref 0.40–1.20)
Calcium: 9.9 mg/dL (ref 8.4–10.5)
GFR: 44.95 mL/min — ABNORMAL LOW (ref >60.00)
GLUCOSE: 113 mg/dL — AB (ref 70–99)
Potassium: 3.6 mEq/L (ref 3.5–5.1)
Sodium: 140 mEq/L (ref 135–145)

## 2016-05-08 LAB — TSH: TSH: 0.2 u[IU]/mL — ABNORMAL LOW (ref 0.35–4.50)

## 2016-05-08 MED ORDER — NITROGLYCERIN 0.4 MG SL SUBL
0.4000 mg | SUBLINGUAL_TABLET | SUBLINGUAL | Status: DC | PRN
Start: 2016-05-08 — End: 2016-11-05

## 2016-05-08 MED ORDER — RANITIDINE HCL 300 MG PO TABS: 150.0000 mg | ORAL_TABLET | Freq: Two times a day (BID) | ORAL | Status: DC

## 2016-05-08 NOTE — Progress Notes (Signed)
Patient presents to clinic today for repeat assessment of thyroid levels and renal function. Patient has had history of labile TSH levels due to medication noncompliance with TSH getting as high as > 90. Patient is now compliant with medication with TSH within normal limits. We have been keeping a close eye on levels in case a lower dose is needed now that she is compliant. At last visit Creatinine noted to be slightly elevated from baseline. Patient instructed to decrease use of Protonix. Endorses stopping Protonix. Endorses some mild occasional recurrence of reflux.   Past Medical History  Diagnosis Date  . Chicken pox   . Diverticulitis   . Hyperlipidemia   . Hypertension   . Gout     Right Foot  . Hypothyroidism   . Palpitations     "doctor thought it was related to my thyroid"  . Sleep apnea     "I don't wear my mask" (02/21/2016)  . History of blood transfusion     "said my blood was low"  . Frequent headaches     "maybe twice/week" (03/12/2016)  . Migraine     "maybe twice/year" (02/21/2016)  . Arthritis     "minor in my shoulders" (02/21/2016)  . History of gout     Current Outpatient Prescriptions on File Prior to Visit  Medication Sig Dispense Refill  . acetaminophen (TYLENOL) 325 MG tablet Take 650 mg by mouth every 6 (six) hours as needed (pain).     Marland Kitchen allopurinol (ZYLOPRIM) 100 MG tablet TAKE 2 TABLETS BY MOUTH DAILY. 60 tablet 5  . amLODipine (NORVASC) 10 MG tablet TAKE 1 TABLET BY MOUTH EVERY DAY 30 tablet 3  . aspirin EC 81 MG EC tablet Take 1 tablet (81 mg total) by mouth daily.    Marland Kitchen atenolol (TENORMIN) 25 MG tablet Take 25 mg by mouth daily at 12 noon.   3  . hydrALAZINE (APRESOLINE) 50 MG tablet Take 1 tablet (50 mg total) by mouth 3 (three) times daily. 90 tablet 11  . isosorbide mononitrate (IMDUR) 30 MG 24 hr tablet Take 1 tablet (30 mg total) by mouth daily. 90 tablet 3  . lovastatin (MEVACOR) 20 MG tablet Take 20 mg by mouth at bedtime.   3  . meclizine  (ANTIVERT) 25 MG tablet Take 25 mg by mouth 3 (three) times daily as needed for dizziness. Reported on 03/20/2016    . OVER THE COUNTER MEDICATION Place 1 drop into both eyes daily as needed (dry eyes). Over the counter lubricating eye drops    . pantoprazole (PROTONIX) 40 MG tablet Take 1 tablet (40 mg total) by mouth daily at 6 (six) AM. 30 tablet 11   No current facility-administered medications on file prior to visit.    Allergies  Allergen Reactions  . Pineapple Swelling    Reaction to fresh pineapple - tongue swelling    Family History  Problem Relation Age of Onset  . Heart disease Mother   . Heart attack Mother     died from it  . Hypertension Mother   . Arthritis Mother   . Stroke Father   . Cancer Maternal Grandmother   . Heart attack Maternal Aunt   . Renal Disease Maternal Aunt   . Multiple myeloma Maternal Uncle   . Emphysema Maternal Uncle   . Heart disease Sister   . Thyroid disease Sister   . Healthy Son     x3  . Healthy Daughter     x5  Social History   Social History  . Marital Status: Widowed    Spouse Name: N/A  . Number of Children: N/A  . Years of Education: N/A   Occupational History  . Retired    Social History Main Topics  . Smoking status: Never Smoker   . Smokeless tobacco: Never Used  . Alcohol Use: No  . Drug Use: No  . Sexual Activity: No   Other Topics Concern  . None   Social History Narrative   Lives alone, family helps in her care.    Review of Systems - See HPI.  All other ROS are negative.  BP 120/50 mmHg  Pulse 69  Temp(Src) 97.8 F (36.6 C) (Oral)  Ht '5\' 3"'$  (1.6 m)  Wt 178 lb 6.4 oz (80.922 kg)  BMI 31.61 kg/m2  SpO2 98%  Physical Exam  Constitutional: She is oriented to person, place, and time and well-developed, well-nourished, and in no distress.  Cardiovascular: Normal rate, regular rhythm, normal heart sounds and intact distal pulses.   Pulmonary/Chest: Effort normal and breath sounds normal. No  respiratory distress. She has no wheezes. She has no rales. She exhibits no tenderness.  Neurological: She is alert and oriented to person, place, and time.  Skin: Skin is warm and dry. No rash noted.  Psychiatric: Affect normal.  Vitals reviewed.   Recent Results (from the past 2160 hour(s))  Comprehensive metabolic panel     Status: Abnormal   Collection Time: 02/21/16 10:55 AM  Result Value Ref Range   Sodium 137 135 - 145 mmol/L   Potassium 3.7 3.5 - 5.1 mmol/L   Chloride 105 101 - 111 mmol/L   CO2 24 22 - 32 mmol/L   Glucose, Bld 83 65 - 99 mg/dL   BUN 25 (H) 6 - 20 mg/dL   Creatinine, Ser 1.24 (H) 0.44 - 1.00 mg/dL   Calcium 9.3 8.9 - 10.3 mg/dL   Total Protein 8.3 (H) 6.5 - 8.1 g/dL   Albumin 4.2 3.5 - 5.0 g/dL   AST 21 15 - 41 U/L   ALT 20 14 - 54 U/L   Alkaline Phosphatase 116 38 - 126 U/L   Total Bilirubin 0.5 0.3 - 1.2 mg/dL   GFR calc non Af Amer 41 (L) >60 mL/min   GFR calc Af Amer 47 (L) >60 mL/min    Comment: (NOTE) The eGFR has been calculated using the CKD EPI equation. This calculation has not been validated in all clinical situations. eGFR's persistently <60 mL/min signify possible Chronic Kidney Disease.    Anion gap 8 5 - 15  CBC with Differential/Platelet     Status: Abnormal   Collection Time: 02/21/16 10:55 AM  Result Value Ref Range   WBC 9.9 4.0 - 10.5 K/uL   RBC 4.31 3.87 - 5.11 MIL/uL   Hemoglobin 11.5 (L) 12.0 - 15.0 g/dL   HCT 35.1 (L) 36.0 - 46.0 %   MCV 81.4 78.0 - 100.0 fL   MCH 26.7 26.0 - 34.0 pg   MCHC 32.8 30.0 - 36.0 g/dL   RDW 14.9 11.5 - 15.5 %   Platelets 386 150 - 400 K/uL   Neutrophils Relative % 52 %   Neutro Abs 5.2 1.7 - 7.7 K/uL   Lymphocytes Relative 31 %   Lymphs Abs 3.0 0.7 - 4.0 K/uL   Monocytes Relative 12 %   Monocytes Absolute 1.2 (H) 0.1 - 1.0 K/uL   Eosinophils Relative 5 %   Eosinophils Absolute 0.5 0.0 -  0.7 K/uL   Basophils Relative 0 %   Basophils Absolute 0.0 0.0 - 0.1 K/uL  Troponin I     Status:  Abnormal   Collection Time: 02/21/16 10:55 AM  Result Value Ref Range   Troponin I 0.09 (H) <0.031 ng/mL    Comment:        PERSISTENTLY INCREASED TROPONIN VALUES IN THE RANGE OF 0.04-0.49 ng/mL CAN BE SEEN IN:       -UNSTABLE ANGINA       -CONGESTIVE HEART FAILURE       -MYOCARDITIS       -CHEST TRAUMA       -ARRYHTHMIAS       -LATE PRESENTING MYOCARDIAL INFARCTION       -COPD   CLINICAL FOLLOW-UP RECOMMENDED.   Urinalysis, Routine w reflex microscopic (not at [facility])     Status: None   Collection Time: 02/21/16 12:40 PM  Result Value Ref Range   Color, Urine YELLOW YELLOW   APPearance CLEAR CLEAR   Specific Gravity, Urine 1.017 1.005 - 1.030   pH 6.0 5.0 - 8.0   Glucose, UA NEGATIVE NEGATIVE mg/dL   Hgb urine dipstick NEGATIVE NEGATIVE   Bilirubin Urine NEGATIVE NEGATIVE   Ketones, ur NEGATIVE NEGATIVE mg/dL   Protein, ur NEGATIVE NEGATIVE mg/dL   Nitrite NEGATIVE NEGATIVE   Leukocytes, UA NEGATIVE NEGATIVE    Comment: MICROSCOPIC NOT DONE ON URINES WITH NEGATIVE PROTEIN, BLOOD, LEUKOCYTES, NITRITE, OR GLUCOSE <1000 mg/dL.  MRSA PCR Screening     Status: None   Collection Time: 02/21/16  5:06 PM  Result Value Ref Range   MRSA by PCR NEGATIVE NEGATIVE    Comment:        The GeneXpert MRSA Assay (FDA approved for NASAL specimens only), is one component of a comprehensive MRSA colonization surveillance program. It is not intended to diagnose MRSA infection nor to guide or monitor treatment for MRSA infections.   TSH     Status: Abnormal   Collection Time: 02/21/16  6:31 PM  Result Value Ref Range   TSH >90.000 (H) 0.350 - 4.500 uIU/mL  Troponin I     Status: Abnormal   Collection Time: 02/21/16  6:31 PM  Result Value Ref Range   Troponin I 0.10 (H) <0.031 ng/mL    Comment:        PERSISTENTLY INCREASED TROPONIN VALUES IN THE RANGE OF 0.04-0.49 ng/mL CAN BE SEEN IN:       -UNSTABLE ANGINA       -CONGESTIVE HEART FAILURE       -MYOCARDITIS       -CHEST  TRAUMA       -ARRYHTHMIAS       -LATE PRESENTING MYOCARDIAL INFARCTION       -COPD   CLINICAL FOLLOW-UP RECOMMENDED.   Hepatic function panel     Status: Abnormal   Collection Time: 02/21/16  6:31 PM  Result Value Ref Range   Total Protein 8.0 6.5 - 8.1 g/dL   Albumin 3.8 3.5 - 5.0 g/dL   AST 46 (H) 15 - 41 U/L   ALT 19 14 - 54 U/L   Alkaline Phosphatase 100 38 - 126 U/L   Total Bilirubin 0.5 0.3 - 1.2 mg/dL   Bilirubin, Direct <0.1 (L) 0.1 - 0.5 mg/dL   Indirect Bilirubin NOT CALCULATED 0.3 - 0.9 mg/dL  Hemoglobin A1c     Status: Abnormal   Collection Time: 02/21/16  6:31 PM  Result Value Ref Range   Hgb A1c  MFr Bld 5.8 (H) 4.8 - 5.6 %    Comment: (NOTE)         Pre-diabetes: 5.7 - 6.4         Diabetes: >6.4         Glycemic control for adults with diabetes: <7.0    Mean Plasma Glucose 120 mg/dL    Comment: (NOTE) Performed At: [facility] [place], [place] [id number] Lindon Romp MD DT:[id number]   Protime-INR     Status: None   Collection Time: 02/21/16  6:31 PM  Result Value Ref Range   Prothrombin Time 13.5 11.6 - 15.2 seconds   INR 1.01 0.00 - 1.49  Heparin level (unfractionated)     Status: Abnormal   Collection Time: 02/21/16 10:54 PM  Result Value Ref Range   Heparin Unfractionated 0.71 (H) 0.30 - 0.70 IU/mL    Comment:        IF HEPARIN RESULTS ARE BELOW EXPECTED VALUES, AND PATIENT DOSAGE HAS BEEN CONFIRMED, SUGGEST FOLLOW UP TESTING OF ANTITHROMBIN III LEVELS.   Troponin I     Status: Abnormal   Collection Time: 02/22/16 12:24 AM  Result Value Ref Range   Troponin I 0.10 (H) <0.031 ng/mL    Comment:        PERSISTENTLY INCREASED TROPONIN VALUES IN THE RANGE OF 0.04-0.49 ng/mL CAN BE SEEN IN:       -UNSTABLE ANGINA       -CONGESTIVE HEART FAILURE       -MYOCARDITIS       -CHEST TRAUMA       -ARRYHTHMIAS       -LATE PRESENTING MYOCARDIAL INFARCTION       -COPD   CLINICAL FOLLOW-UP RECOMMENDED.   Lipid panel      Status: Abnormal   Collection Time: 02/22/16 12:24 AM  Result Value Ref Range   Cholesterol 200 0 - 200 mg/dL   Triglycerides 77 <150 mg/dL   HDL 58 >40 mg/dL   Total CHOL/HDL Ratio 3.4 RATIO   VLDL 15 0 - 40 mg/dL   LDL Cholesterol 127 (H) 0 - 99 mg/dL    Comment:        Total Cholesterol/HDL:CHD Risk Coronary Heart Disease Risk Table                     Men   Women  1/2 Average Risk   3.4   3.3  Average Risk       5.0   4.4  2 X Average Risk   9.6   7.1  3 X Average Risk  23.4   11.0        Use the calculated Patient Ratio above and the CHD Risk Table to determine the patient's CHD Risk.        ATP III CLASSIFICATION (LDL):  <100     mg/dL   Optimal  100-129  mg/dL   Near or Above                    Optimal  130-159  mg/dL   Borderline  160-189  mg/dL   High  >190     mg/dL   Very High   CBC     Status: Abnormal   Collection Time: 02/22/16  7:28 AM  Result Value Ref Range   WBC 9.5 4.0 - 10.5 K/uL   RBC 3.80 (L) 3.87 - 5.11 MIL/uL   Hemoglobin 9.6 (L) 12.0 - 15.0 g/dL  HCT 30.6 (L) 36.0 - 46.0 %   MCV 80.5 78.0 - 100.0 fL   MCH 25.3 (L) 26.0 - 34.0 pg   MCHC 31.4 30.0 - 36.0 g/dL   RDW 15.3 11.5 - 15.5 %   Platelets 326 150 - 400 K/uL  Troponin I     Status: Abnormal   Collection Time: 02/22/16  7:28 AM  Result Value Ref Range   Troponin I 0.07 (H) <0.031 ng/mL    Comment:        PERSISTENTLY INCREASED TROPONIN VALUES IN THE RANGE OF 0.04-0.49 ng/mL CAN BE SEEN IN:       -UNSTABLE ANGINA       -CONGESTIVE HEART FAILURE       -MYOCARDITIS       -CHEST TRAUMA       -ARRYHTHMIAS       -LATE PRESENTING MYOCARDIAL INFARCTION       -COPD   CLINICAL FOLLOW-UP RECOMMENDED.   Basic metabolic panel     Status: Abnormal   Collection Time: 02/22/16  7:28 AM  Result Value Ref Range   Sodium 138 135 - 145 mmol/L   Potassium 3.6 3.5 - 5.1 mmol/L   Chloride 106 101 - 111 mmol/L   CO2 23 22 - 32 mmol/L   Glucose, Bld 108 (H) 65 - 99 mg/dL   BUN 26 (H) 6 - 20 mg/dL    Creatinine, Ser 1.56 (H) 0.44 - 1.00 mg/dL   Calcium 9.1 8.9 - 10.3 mg/dL   GFR calc non Af Amer 31 (L) >60 mL/min   GFR calc Af Amer 36 (L) >60 mL/min    Comment: (NOTE) The eGFR has been calculated using the CKD EPI equation. This calculation has not been validated in all clinical situations. eGFR's persistently <60 mL/min signify possible Chronic Kidney Disease.    Anion gap 9 5 - 15  Heparin level (unfractionated)     Status: None   Collection Time: 02/22/16  7:28 AM  Result Value Ref Range   Heparin Unfractionated 0.47 0.30 - 0.70 IU/mL    Comment:        IF HEPARIN RESULTS ARE BELOW EXPECTED VALUES, AND PATIENT DOSAGE HAS BEEN CONFIRMED, SUGGEST FOLLOW UP TESTING OF ANTITHROMBIN III LEVELS.   NM Myocar Multi W/Spect W/Wall Motion / EF     Status: None   Collection Time: 02/22/16  1:15 PM  Result Value Ref Range   Rest HR 73 bpm   Rest BP 164/90 mmHg   Exercise duration (min)  min   Exercise duration (sec)  sec   Estimated workload  METS   Peak HR 96 bpm   Peak BP 181/90 mmHg   MPHR  bpm   Percent HR  %   RPE     LV sys vol  mL   TID     LV dias vol  mL   LHR     SSS     SRS     SDS    Heparin level (unfractionated)     Status: None   Collection Time: 02/22/16  3:19 PM  Result Value Ref Range   Heparin Unfractionated 0.35 0.30 - 0.70 IU/mL    Comment:        IF HEPARIN RESULTS ARE BELOW EXPECTED VALUES, AND PATIENT DOSAGE HAS BEEN CONFIRMED, SUGGEST FOLLOW UP TESTING OF ANTITHROMBIN III LEVELS.   CBC     Status: Abnormal   Collection Time: 03/13/16 11:49 AM  Result Value Ref Range  WBC 7.5 4.0 - 10.5 K/uL   RBC 4.03 3.87 - 5.11 MIL/uL   Hemoglobin 10.5 (L) 12.0 - 15.0 g/dL   HCT 32.3 (L) 36.0 - 46.0 %   MCV 80.1 78.0 - 100.0 fL   MCH 26.1 26.0 - 34.0 pg   MCHC 32.5 30.0 - 36.0 g/dL   RDW 15.3 11.5 - 15.5 %   Platelets 369 150 - 400 K/uL   MPV 8.8 8.6 - 12.4 fL  Basic Metabolic Panel (BMET)     Status: Abnormal   Collection Time: 03/13/16  11:49 AM  Result Value Ref Range   Sodium 140 135 - 146 mmol/L   Potassium 4.0 3.5 - 5.3 mmol/L   Chloride 106 98 - 110 mmol/L   CO2 24 20 - 31 mmol/L   Glucose, Bld 123 (H) 65 - 99 mg/dL   BUN 26 (H) 7 - 25 mg/dL   Creat 1.33 (H) 0.60 - 0.93 mg/dL   Calcium 9.8 8.6 - 10.4 mg/dL  TSH     Status: None   Collection Time: 03/20/16 11:15 AM  Result Value Ref Range   TSH 0.42 0.35 - 4.50 uIU/mL  T4, free     Status: None   Collection Time: 03/20/16 11:15 AM  Result Value Ref Range   Free T4 1.51 0.60 - 1.60 ng/dL  TSH     Status: None   Collection Time: 04/10/16 10:36 AM  Result Value Ref Range   TSH 4.29 0.35 - 4.50 uIU/mL  Basic Metabolic Panel (BMET)     Status: Abnormal   Collection Time: 04/10/16 10:36 AM  Result Value Ref Range   Sodium 140 135 - 145 mEq/L   Potassium 3.6 3.5 - 5.1 mEq/L   Chloride 105 96 - 112 mEq/L   CO2 26 19 - 32 mEq/L   Glucose, Bld 105 (H) 70 - 99 mg/dL   BUN 23 6 - 23 mg/dL   Creatinine, Ser 1.42 (H) 0.40 - 1.20 mg/dL   Calcium 10.2 8.4 - 10.5 mg/dL   GFR 46.06 (L) >60.00 mL/min    Assessment/Plan: Hypothyroidism Will repeat TSH today.  Esophageal reflux Will stay off of PPI due to effects on memory and renal function. Will attempt trial of Zantac PRN.  Elevated BUN Has stopped PPI. Will recheck BMP today.

## 2016-05-08 NOTE — Assessment & Plan Note (Signed)
Has stopped PPI. Will recheck BMP today.

## 2016-05-08 NOTE — Assessment & Plan Note (Signed)
Will stay off of PPI due to effects on memory and renal function. Will attempt trial of Zantac PRN.

## 2016-05-08 NOTE — Assessment & Plan Note (Signed)
Will repeat TSH today 

## 2016-05-08 NOTE — Patient Instructions (Signed)
Please go to the lab for blood work. I will call with results. Again no Protonix or Nexium. Start Zantac as directed for reflux.  Food Choices for Gastroesophageal Reflux Disease, Adult When you have gastroesophageal reflux disease (GERD), the foods you eat and your eating habits are very important. Choosing the right foods can help ease the discomfort of GERD. WHAT GENERAL GUIDELINES DO I NEED TO FOLLOW?  Choose fruits, vegetables, whole grains, low-fat dairy products, and low-fat meat, fish, and poultry.  Limit fats such as oils, salad dressings, butter, nuts, and avocado.  Keep a food diary to identify foods that cause symptoms.  Avoid foods that cause reflux. These may be different for different people.  Eat frequent small meals instead of three large meals each day.  Eat your meals slowly, in a relaxed setting.  Limit fried foods.  Cook foods using methods other than frying.  Avoid drinking alcohol.  Avoid drinking large amounts of liquids with your meals.  Avoid bending over or lying down until 2-3 hours after eating. WHAT FOODS ARE NOT RECOMMENDED? The following are some foods and drinks that may worsen your symptoms: Vegetables Tomatoes. Tomato juice. Tomato and spaghetti sauce. Chili peppers. Onion and garlic. Horseradish. Fruits Oranges, grapefruit, and lemon (fruit and juice). Meats High-fat meats, fish, and poultry. This includes hot dogs, ribs, ham, sausage, salami, and bacon. Dairy Whole milk and chocolate milk. Sour cream. Cream. Butter. Ice cream. Cream cheese.  Beverages Coffee and tea, with or without caffeine. Carbonated beverages or energy drinks. Condiments Hot sauce. Barbecue sauce.  Sweets/Desserts Chocolate and cocoa. Donuts. Peppermint and spearmint. Fats and Oils High-fat foods, including [place] fries and potato chips. Other Vinegar. Strong spices, such as black pepper, white pepper, red pepper, cayenne, curry powder, cloves, ginger, and chili  powder. The items listed above may not be a complete list of foods and beverages to avoid. Contact your dietitian for more information.   This information is not intended to replace advice given to you by your health care provider. Make sure you discuss any questions you have with your health care provider.   Document Released: 12/07/2005 Document Revised: 12/28/2014 Document Reviewed: 10/11/2013 Elsevier Interactive Patient Education [employer].

## 2016-05-08 NOTE — Progress Notes (Signed)
Pre visit review using our clinic review tool, if applicable. No additional management support is needed unless otherwise documented below in the visit note. 

## 2016-05-10 ENCOUNTER — Other Ambulatory Visit: Payer: Self-pay | Admitting: Physician Assistant

## 2016-05-13 ENCOUNTER — Encounter: Payer: Self-pay | Admitting: Physician Assistant

## 2016-06-12 ENCOUNTER — Other Ambulatory Visit: Payer: Medicare Other

## 2016-07-01 ENCOUNTER — Telehealth: Payer: Self-pay | Admitting: Physician Assistant

## 2016-07-01 ENCOUNTER — Emergency Department (HOSPITAL_COMMUNITY): Payer: Medicare Other

## 2016-07-01 ENCOUNTER — Encounter (HOSPITAL_COMMUNITY): Payer: Self-pay | Admitting: Emergency Medicine

## 2016-07-01 ENCOUNTER — Emergency Department (HOSPITAL_COMMUNITY)
Admission: EM | Admit: 2016-07-01 | Discharge: 2016-07-01 | Disposition: A | Discharge status: Home / Self Care | Payer: Medicare Other | Attending: Emergency Medicine | Admitting: Emergency Medicine

## 2016-07-01 DIAGNOSIS — Z7982 Long term (current) use of aspirin: Secondary | ICD-10-CM | POA: Insufficient documentation

## 2016-07-01 DIAGNOSIS — R0789 Other chest pain: Secondary | ICD-10-CM | POA: Insufficient documentation

## 2016-07-01 DIAGNOSIS — R079 Chest pain, unspecified: Secondary | ICD-10-CM | POA: Diagnosis present

## 2016-07-01 DIAGNOSIS — Z79899 Other long term (current) drug therapy: Secondary | ICD-10-CM | POA: Insufficient documentation

## 2016-07-01 DIAGNOSIS — I10 Essential (primary) hypertension: Secondary | ICD-10-CM | POA: Insufficient documentation

## 2016-07-01 LAB — COMPREHENSIVE METABOLIC PANEL
ALK PHOS: 95 U/L (ref 38–126)
ALT: 15 U/L (ref 14–54)
AST: 19 U/L (ref 15–41)
Albumin: 3.9 g/dL (ref 3.5–5.0)
Anion gap: 8 (ref 5–15)
BUN: 22 mg/dL — ABNORMAL HIGH (ref 6–20)
CALCIUM: 9.6 mg/dL (ref 8.9–10.3)
CO2: 22 mmol/L (ref 22–32)
CREATININE: 1.46 mg/dL — AB (ref 0.44–1.00)
Chloride: 107 mmol/L (ref 101–111)
GFR, EST AFRICAN AMERICAN: 39 mL/min — AB (ref >60)
GFR, EST NON AFRICAN AMERICAN: 33 mL/min — AB (ref >60)
Glucose, Bld: 134 mg/dL — ABNORMAL HIGH (ref 65–99)
Potassium: 3.6 mmol/L (ref 3.5–5.1)
Sodium: 137 mmol/L (ref 135–145)
TOTAL PROTEIN: 8.3 g/dL — AB (ref 6.5–8.1)
Total Bilirubin: 0.7 mg/dL (ref 0.3–1.2)

## 2016-07-01 LAB — CBC
HCT: 38 % (ref 36.0–46.0)
HEMOGLOBIN: 12.2 g/dL (ref 12.0–15.0)
MCH: 25.8 pg — AB (ref 26.0–34.0)
MCHC: 32.1 g/dL (ref 30.0–36.0)
MCV: 80.5 fL (ref 78.0–100.0)
PLATELETS: 447 10*3/uL — AB (ref 150–400)
RBC: 4.72 MIL/uL (ref 3.87–5.11)
RDW: 14.9 % (ref 11.5–15.5)
WBC: 7 10*3/uL (ref 4.0–10.5)

## 2016-07-01 LAB — PROTIME-INR
INR: 0.89 (ref 0.00–1.49)
PROTHROMBIN TIME: 12.3 s (ref 11.6–15.2)

## 2016-07-01 LAB — MAGNESIUM: MAGNESIUM: 2.1 mg/dL (ref 1.7–2.4)

## 2016-07-01 LAB — TROPONIN I: Troponin I: <0.03 ng/mL (ref <0.03)

## 2016-07-01 MED ORDER — ASPIRIN 81 MG PO CHEW: 324.0000 mg | CHEWABLE_TABLET | Freq: Once | ORAL | Status: DC

## 2016-07-01 MED ORDER — GI COCKTAIL ~~LOC~~
30.0000 mL | Freq: Once | ORAL | Status: AC
  Administered 2016-07-01: 30 mL via ORAL
  Filled 2016-07-01: qty 30

## 2016-07-01 MED ORDER — SODIUM CHLORIDE 0.9 % IV SOLN
INTRAVENOUS | Status: DC
Start: 2016-07-01 — End: 2016-07-01
  Administered 2016-07-01: 14:00:00 via INTRAVENOUS

## 2016-07-01 NOTE — ED Notes (Signed)
Ems states pt started having chest pain about 60 mins ago. Pt states she feels much better now.

## 2016-07-01 NOTE — Telephone Encounter (Signed)
Thank you. Yes please follow-up with patient. Marland Kitchen

## 2016-07-01 NOTE — Telephone Encounter (Signed)
FYI: LMOM with contact name and number [for return call, if needed] RE: her 05/08/16 Results and further provider instructions being released to My Chart after a phone attempt to reach patient. Apologized, as I was out of the office during the week of these notes; and I don't release any Results [to My Chart] that are not normal; especially with my senior patients, and I always send a letter only after three attempts are made to reach patient by telephone unsuccessfully. Patient was checked in to ED this afternoon and discharged, upon researching her chart; I informed patient that I will attempt to reach her again on Friday, 07/03/16 [upon returning to office] and get her scheduled for ED follow-up and discuss this results matter further/SLS 07/12  Notes Recorded by Damita Dunnings, CMA on 05/15/2016 at 12:08 PM Results released to [place]. Notes Recorded by Harl Bowie, CMA on 05/12/2016 at 6:29 PM Called and [facility] @ 6:26pm @ [phone]) asking the pt to RTC regarding recent lab results.//AB/CMA Notes Recorded by Brunetta Jeans, PA-C on 05/10/2016 at 1:02 PM Renal function is about the same. Now that she is finally off of all PPI and taking Zantac instead I am hoping these numbers will improve some. Would like her to come to lab in 2 months for repeat BMP. Also TSH is now low which is likely from her being compliant with her thyroid medication. Please verify with her the dose of medication she is currently taking -- should be 200 mcg. We need to decrease the dose. Let me know once you have verified dose so I can send in a new Rx. Would repeat TH in 2 months as well.

## 2016-07-01 NOTE — Discharge Instructions (Signed)
As discussed, your evaluation today has been largely reassuring.  But, it is important that you monitor your condition carefully, and do not hesitate to return to the ED if you develop new, or concerning changes in your condition. ? ?Otherwise, please follow-up with your physician for appropriate ongoing care. ? ?

## 2016-07-01 NOTE — ED Provider Notes (Signed)
CSN: [id number]     Arrival date & time 07/01/16  1353 History   First MD Initiated Contact with Patient 07/01/16 1353     Chief Complaint  Patient presents with  . Chest Pain    HPI  Patient presents with concern of epigastric pain. Pain began about one hour prior to ED arrival. Patient has history of hypertension, denies history of heart disease. She also has a history of heartburn, typically takes ranitidine approximately 30 minutes prior to eating. Today she did not do that. Initially the patient's pain was severe, nonradiating, sharp, burning. Symptoms improve with nitroglycerin, aspirin, provided by EMS providers. Currently she has 1/10 pain, denies other complaints. No other recent health changes, complaints.   Past Medical History  Diagnosis Date  . Chicken pox   . Diverticulitis   . Hyperlipidemia   . Hypertension   . Gout     Right Foot  . Hypothyroidism   . Palpitations     "doctor thought it was related to my thyroid"  . Sleep apnea     "I don't wear my mask" (02/21/2016)  . History of blood transfusion     "said my blood was low"  . Frequent headaches     "maybe twice/week" (03/12/2016)  . Migraine     "maybe twice/year" (02/21/2016)  . Arthritis     "minor in my shoulders" (02/21/2016)  . History of gout    Past Surgical History  Procedure Laterality Date  . Abdominal hysterectomy      "for fibroid tumors"  . Tubal ligation     Family History  Problem Relation Age of Onset  . Heart disease Mother   . Heart attack Mother     died from it  . Hypertension Mother   . Arthritis Mother   . Stroke Father   . Cancer Maternal Grandmother   . Heart attack Maternal Aunt   . Renal Disease Maternal Aunt   . Multiple myeloma Maternal Uncle   . Emphysema Maternal Uncle   . Heart disease Sister   . Thyroid disease Sister   . Healthy Son     x3  . Healthy Daughter     x5   Social History  Substance Use Topics  . Smoking status: Never Smoker   . Smokeless  tobacco: Never Used  . Alcohol Use: No   OB History    No data available     Review of Systems  Constitutional:       Per HPI, otherwise negative  HENT:       Per HPI, otherwise negative  Respiratory:       Per HPI, otherwise negative  Cardiovascular:       Per HPI, otherwise negative  Gastrointestinal: Negative for vomiting.  Endocrine:       Negative aside from HPI  Genitourinary:       Neg aside from HPI   Musculoskeletal:       Per HPI, otherwise negative  Skin: Negative.   Neurological: Negative for syncope.      Allergies  Pineapple  Home Medications   Prior to Admission medications   Medication Sig Start Date End Date Taking? Authorizing Provider  acetaminophen (TYLENOL) 325 MG tablet Take 650 mg by mouth every 6 (six) hours as needed (pain).     Historical Provider, MD  allopurinol (ZYLOPRIM) 100 MG tablet TAKE 2 TABLETS BY MOUTH DAILY. 04/17/16   Brunetta Jeans, PA-C  amLODipine (NORVASC) 10 MG tablet TAKE 1  TABLET BY MOUTH EVERY DAY 11/25/15   Brunetta Jeans, PA-C  aspirin EC 81 MG EC tablet Take 1 tablet (81 mg total) by mouth daily. 02/22/16   Brett Canales, PA-C  atenolol (TENORMIN) 25 MG tablet Take 25 mg by mouth daily at 12 noon.  09/03/15   Historical Provider, MD  hydrALAZINE (APRESOLINE) 50 MG tablet Take 1 tablet (50 mg total) by mouth 3 (three) times daily. 02/22/16   Brett Canales, PA-C  isosorbide mononitrate (IMDUR) 30 MG 24 hr tablet Take 1 tablet (30 mg total) by mouth daily. 03/13/16   [name], PA  levothyroxine (SYNTHROID, LEVOTHROID) 25 MCG tablet Take 1 tablet by mouth daily. 04/15/16   Historical Provider, MD  lovastatin (MEVACOR) 20 MG tablet Take 20 mg by mouth at bedtime.  09/03/15   Historical Provider, MD  meclizine (ANTIVERT) 25 MG tablet Take 25 mg by mouth 3 (three) times daily as needed for dizziness. Reported on 03/20/2016    Historical Provider, MD  nitroGLYCERIN (NITROSTAT) 0.4 MG SL tablet Place 1 tablet (0.4 mg total) under the  tongue every 5 (five) minutes x 3 doses as needed for chest pain. 05/08/16   Brunetta Jeans, PA-C  OVER THE COUNTER MEDICATION Place 1 drop into both eyes daily as needed (dry eyes). Over the counter lubricating eye drops    Historical Provider, MD  ranitidine (ZANTAC) 300 MG tablet Take 0.5 tablets (150 mg total) by mouth 2 (two) times daily. 05/08/16   Brunetta Jeans, PA-C   BP 180/81 mmHg  Pulse 86  Temp(Src) 98.2 F (36.8 C) (Oral)  Resp 18  SpO2 100% Physical Exam  Constitutional: She is oriented to person, place, and time. She appears well-developed and well-nourished. No distress.  HENT:  Head: Normocephalic and atraumatic.  Eyes: Conjunctivae and EOM are normal.  Cardiovascular: Normal rate and regular rhythm.   Pulmonary/Chest: Effort normal and breath sounds normal. No stridor. No respiratory distress.  Abdominal: She exhibits no distension.  Musculoskeletal: She exhibits no edema.  Neurological: She is alert and oriented to person, place, and time. No cranial nerve deficit.  Skin: Skin is warm and dry.  Psychiatric: She has a normal mood and affect.  Nursing note and vitals reviewed.   ED Course  Procedures (including critical care time) Labs Review Labs Reviewed  CBC - Abnormal; Notable for the following:    MCH 25.8 (*)    Platelets 447 (*)    All other components within normal limits  COMPREHENSIVE METABOLIC PANEL - Abnormal; Notable for the following:    Glucose, Bld 134 (*)    BUN 22 (*)    Creatinine, Ser 1.46 (*)    Total Protein 8.3 (*)    GFR calc non Af Amer 33 (*)    GFR calc Af Amer 39 (*)    All other components within normal limits  MAGNESIUM  PROTIME-INR  TROPONIN I    Imaging Review No results found. I have personally reviewed and evaluated these images and lab results as part of my medical decision-making.   EKG Interpretation   Date/Time:  Wednesday July 01 2016 14:03:26 EDT Ventricular Rate:  85 PR Interval:    QRS Duration:  134 QT Interval:  418 QTC Calculation: 498 R Axis:   10 Text Interpretation:  Sinus rhythm Consider left atrial enlargement Right  bundle branch block LVH with IVCD and secondary repol abnrm Borderline  prolonged QT interval No significant change since last tracing Abnormal  ekg Confirmed by Carmin Muskrat  MD [id number]) on 07/01/2016 2:09:35 PM     EMR notable for 3/17 cardiology visit notes suggesting ongoing episodes of similar pain,   4:00 PM On re-exam the patient states that she continues to feel better. We discussed PMD f/u. MDM  Patient presents after an episode of chest pain that began after eating, and after taking her ranitidine. Patient has a history of multiple prior similar chest pain episodes, and today's evaluation is reassuring, with normal troponin, normal EKG, no distress, and resolution of pain. Patient's episodes have been well characterized in the past, and given the resolution of pain today, this seems like a similar episode. She will follow-up with primary care.  Carmin Muskrat, MD 07/01/16 [phone]

## 2016-07-01 NOTE — Telephone Encounter (Signed)
Relation to [id number] Call back [phone]   Reason for call:  patient inquiring about 05/08/16 lab results. Please advise

## 2016-07-02 NOTE — Telephone Encounter (Signed)
I spoke with pt and she has a follow up appointment on 07/10/16 with pcp. FYI.

## 2016-07-10 ENCOUNTER — Encounter: Payer: Self-pay | Admitting: Physician Assistant

## 2016-07-10 ENCOUNTER — Ambulatory Visit (INDEPENDENT_AMBULATORY_CARE_PROVIDER_SITE_OTHER): Payer: Medicare Other | Admitting: Physician Assistant

## 2016-07-10 VITALS — BP 147/69 | HR 68 | Temp 98.0°F | Resp 16 | Ht 63.0 in | Wt 172.2 lb

## 2016-07-10 DIAGNOSIS — R946 Abnormal results of thyroid function studies: Secondary | ICD-10-CM | POA: Diagnosis not present

## 2016-07-10 DIAGNOSIS — R079 Chest pain, unspecified: Secondary | ICD-10-CM | POA: Diagnosis not present

## 2016-07-10 LAB — TSH: TSH: 13.09 u[IU]/mL — ABNORMAL HIGH (ref 0.35–4.50)

## 2016-07-10 NOTE — Progress Notes (Signed)
Pre visit review using our clinic review tool, if applicable. No additional management support is needed unless otherwise documented below in the visit note/SLS  

## 2016-07-10 NOTE — Patient Instructions (Addendum)
Please continue your medications as directed. It is very important that you do not miss doses of your BP medication.  Stay hydrated but watch salt intake.   Go to the lab for blood work. I will call you with your results.  FU with me in 3 months. Return sooner if needed.

## 2016-07-10 NOTE — Progress Notes (Signed)
Patient presents to clinic today for ER follow-up. Patient presented to ER on 07/01/2016 c/o chest discomfort. Workup included negative serial troponin and EKG revealing signs of LVH but no acute change, no stemi, sinus rhythm. Patient with recent low-risk nuclear stress test. Patient's symptoms resolved during ER stay. She was discharged with precautions for strict medication adherence. Since discharge patient denies recurrent chest pain, lightheadedness or dizziness. Is still taking thyroid medication, but notes she has not been taking before other medications as directed over the past couple of weeks. Is trying to watch diet and is drinking plenty of water.   Past Medical History  Diagnosis Date  . Chicken pox   . Diverticulitis   . Hyperlipidemia   . Hypertension   . Gout     Right Foot  . Hypothyroidism   . Palpitations     "doctor thought it was related to my thyroid"  . Sleep apnea     "I don't wear my mask" (02/21/2016)  . History of blood transfusion     "said my blood was low"  . Frequent headaches     "maybe twice/week" (03/12/2016)  . Migraine     "maybe twice/year" (02/21/2016)  . Arthritis     "minor in my shoulders" (02/21/2016)  . History of gout     Current Outpatient Prescriptions on File Prior to Visit  Medication Sig Dispense Refill  . acetaminophen (TYLENOL) 325 MG tablet Take 650 mg by mouth every 6 (six) hours as needed (pain).     Marland Kitchen allopurinol (ZYLOPRIM) 100 MG tablet TAKE 2 TABLETS BY MOUTH DAILY. 60 tablet 5  . amLODipine (NORVASC) 10 MG tablet TAKE 1 TABLET BY MOUTH EVERY DAY 30 tablet 3  . aspirin EC 81 MG EC tablet Take 1 tablet (81 mg total) by mouth daily.    Marland Kitchen atenolol (TENORMIN) 25 MG tablet Take 25 mg by mouth daily at 12 noon.   3  . hydrALAZINE (APRESOLINE) 50 MG tablet Take 1 tablet (50 mg total) by mouth 3 (three) times daily. 90 tablet 11  . isosorbide mononitrate (IMDUR) 30 MG 24 hr tablet Take 1 tablet (30 mg total) by mouth daily. (Patient  not taking: Reported on 07/10/2016) 90 tablet 3  . levothyroxine (SYNTHROID, LEVOTHROID) 200 MCG tablet Take 200 mcg by mouth daily.  3  . lovastatin (MEVACOR) 20 MG tablet Take 20 mg by mouth at bedtime.   3  . meclizine (ANTIVERT) 25 MG tablet Take 25 mg by mouth 3 (three) times daily as needed for dizziness. Reported on 03/20/2016    . nitroGLYCERIN (NITROSTAT) 0.4 MG SL tablet Place 1 tablet (0.4 mg total) under the tongue every 5 (five) minutes x 3 doses as needed for chest pain. 25 tablet 12  . OVER THE COUNTER MEDICATION Place 1 drop into both eyes daily as needed (dry eyes). Over the counter lubricating eye drops    . ranitidine (ZANTAC) 300 MG tablet Take 0.5 tablets (150 mg total) by mouth 2 (two) times daily. 60 tablet 1   No current facility-administered medications on file prior to visit.    Allergies  Allergen Reactions  . Pineapple Swelling    Reaction to fresh pineapple - tongue swelling    Family History  Problem Relation Age of Onset  . Heart disease Mother   . Heart attack Mother     died from it  . Hypertension Mother   . Arthritis Mother   . Stroke Father   .  Cancer Maternal Grandmother   . Heart attack Maternal Aunt   . Renal Disease Maternal Aunt   . Multiple myeloma Maternal Uncle   . Emphysema Maternal Uncle   . Heart disease Sister   . Thyroid disease Sister   . Healthy Son     x3  . Healthy Daughter     x5    Social History   Social History  . Marital Status: Widowed    Spouse Name: N/A  . Number of Children: N/A  . Years of Education: N/A   Occupational History  . Retired    Social History Main Topics  . Smoking status: Never Smoker   . Smokeless tobacco: Never Used  . Alcohol Use: No  . Drug Use: No  . Sexual Activity: No   Other Topics Concern  . None   Social History Narrative   Lives alone, family helps in her care.    Review of Systems - See HPI.  All other ROS are negative.  BP 147/69 mmHg  Pulse 68  Temp(Src) 98 F  (36.7 C) (Oral)  Resp 16  Ht _0  (1.6 m)  Wt 172 lb 4 oz (78.132 kg)  BMI 30.52 kg/m2  SpO2 100%  Physical Exam  Constitutional: She is oriented to person, place, and time and well-developed, well-nourished, and in no distress.  HENT:  Head: Normocephalic and atraumatic.  Eyes: Conjunctivae are normal.  Neck: Neck supple.  Cardiovascular: Normal rate, regular rhythm, normal heart sounds and intact distal pulses.   Pulmonary/Chest: Effort normal and breath sounds normal. No respiratory distress. She has no wheezes. She has no rales. She exhibits no tenderness.  Neurological: She is alert and oriented to person, place, and time.  Skin: Skin is warm and dry. No rash noted.  Psychiatric: Affect normal.  Vitals reviewed.   Recent Results (from the past 2160 hour(s))  TSH     Status: Abnormal   Collection Time: 05/08/16  9:34 AM  Result Value Ref Range   TSH 0.20 (L) 0.35 - 4.50 uIU/mL  Basic Metabolic Panel (BMET)     Status: Abnormal   Collection Time: 05/08/16  9:34 AM  Result Value Ref Range   Sodium 140 135 - 145 mEq/L   Potassium 3.6 3.5 - 5.1 mEq/L   Chloride 107 96 - 112 mEq/L   CO2 23 19 - 32 mEq/L   Glucose, Bld 113 (H) 70 - 99 mg/dL   BUN 28 (H) 6 - 23 mg/dL   Creatinine, Ser 1.45 (H) 0.40 - 1.20 mg/dL   Calcium 9.9 8.4 - 10.5 mg/dL   GFR 44.95 (L) >60.00 mL/min  CBC     Status: Abnormal   Collection Time: 07/01/16  2:10 PM  Result Value Ref Range   WBC 7.0 4.0 - 10.5 K/uL   RBC 4.72 3.87 - 5.11 MIL/uL   Hemoglobin 12.2 12.0 - 15.0 g/dL   HCT 38.0 36.0 - 46.0 %   MCV 80.5 78.0 - 100.0 fL   MCH 25.8 (L) 26.0 - 34.0 pg   MCHC 32.1 30.0 - 36.0 g/dL   RDW 14.9 11.5 - 15.5 %   Platelets 447 (H) 150 - 400 K/uL  Comprehensive metabolic panel     Status: Abnormal   Collection Time: 07/01/16  2:10 PM  Result Value Ref Range   Sodium 137 135 - 145 mmol/L   Potassium 3.6 3.5 - 5.1 mmol/L   Chloride 107 101 - 111 mmol/L   CO2 22 22 -  32 mmol/L   Glucose, Bld 134  (H) 65 - 99 mg/dL   BUN 22 (H) 6 - 20 mg/dL   Creatinine, Ser 1.46 (H) 0.44 - 1.00 mg/dL   Calcium 9.6 8.9 - 10.3 mg/dL   Total Protein 8.3 (H) 6.5 - 8.1 g/dL   Albumin 3.9 3.5 - 5.0 g/dL   AST 19 15 - 41 U/L   ALT 15 14 - 54 U/L   Alkaline Phosphatase 95 38 - 126 U/L   Total Bilirubin 0.7 0.3 - 1.2 mg/dL   GFR calc non Af Amer 33 (L) >60 mL/min   GFR calc Af Amer 39 (L) >60 mL/min    Comment: (NOTE) The eGFR has been calculated using the CKD EPI equation. This calculation has not been validated in all clinical situations. eGFR's persistently <60 mL/min signify possible Chronic Kidney Disease.    Anion gap 8 5 - 15  Magnesium     Status: None   Collection Time: 07/01/16  2:10 PM  Result Value Ref Range   Magnesium 2.1 1.7 - 2.4 mg/dL  Protime-INR     Status: None   Collection Time: 07/01/16  2:10 PM  Result Value Ref Range   Prothrombin Time 12.3 11.6 - 15.2 seconds   INR 0.89 0.00 - 1.49  Troponin I (order at [facility])     Status: None   Collection Time: 07/01/16  2:10 PM  Result Value Ref Range   Troponin I <0.03 <0.03 ng/mL    Assessment/Plan: 1. Abnormal thyroid function test Concern again that she is not taking her medications consistently as directed. Discussed proper dosing of levothyroxine. Will repeat TSH today. - TSH  2. Chest pain, unspecified chest pain type  Negative ER workup for ACS. Again recent low-risk stress test. Patient has resumed her BP regimen. BP much improved today compared to ER visit. Discussed continued appliance, exercise and diet. FU scheduled. Alarm signs/symptoms reviewed that would prompt ER assessment.   Leeanne Rio, PA-C

## 2016-07-13 ENCOUNTER — Other Ambulatory Visit: Payer: Self-pay | Admitting: Physician Assistant

## 2016-07-13 DIAGNOSIS — R7989 Other specified abnormal findings of blood chemistry: Secondary | ICD-10-CM

## 2016-07-14 ENCOUNTER — Encounter: Payer: Self-pay | Admitting: Interventional Cardiology

## 2016-08-03 ENCOUNTER — Ambulatory Visit: Payer: Medicare Other | Admitting: Interventional Cardiology

## 2016-08-14 ENCOUNTER — Other Ambulatory Visit (INDEPENDENT_AMBULATORY_CARE_PROVIDER_SITE_OTHER): Payer: Commercial Managed Care - HMO

## 2016-08-14 DIAGNOSIS — R946 Abnormal results of thyroid function studies: Secondary | ICD-10-CM | POA: Diagnosis not present

## 2016-08-14 DIAGNOSIS — R7989 Other specified abnormal findings of blood chemistry: Secondary | ICD-10-CM

## 2016-08-14 LAB — TSH: TSH: 1.29 u[IU]/mL (ref 0.35–4.50)

## 2016-08-19 ENCOUNTER — Telehealth: Payer: Self-pay | Admitting: Physician Assistant

## 2016-08-19 NOTE — Telephone Encounter (Signed)
Refills sent to pharmacy. Pt is due for follow up with [facility] on or after 10/10/16.  Please call pt to schedule appt. Thanks!

## 2016-08-20 NOTE — Telephone Encounter (Signed)
Called patient and scheduled appointment.

## 2016-09-02 ENCOUNTER — Other Ambulatory Visit: Payer: Self-pay | Admitting: Physician Assistant

## 2016-09-02 DIAGNOSIS — K219 Gastro-esophageal reflux disease without esophagitis: Secondary | ICD-10-CM

## 2016-09-11 ENCOUNTER — Ambulatory Visit (INDEPENDENT_AMBULATORY_CARE_PROVIDER_SITE_OTHER): Payer: Commercial Managed Care - HMO | Admitting: Physician Assistant

## 2016-09-11 VITALS — BP 130/68 | HR 58 | Temp 98.3°F | Wt 172.0 lb

## 2016-09-11 DIAGNOSIS — R0789 Other chest pain: Secondary | ICD-10-CM

## 2016-09-11 DIAGNOSIS — I10 Essential (primary) hypertension: Secondary | ICD-10-CM

## 2016-09-11 DIAGNOSIS — Z23 Encounter for immunization: Secondary | ICD-10-CM

## 2016-09-11 MED ORDER — LOVASTATIN 20 MG PO TABS: 20.0000 mg | ORAL_TABLET | Freq: Every day | ORAL | 3 refills | Status: DC

## 2016-09-11 NOTE — Progress Notes (Signed)
Pre visit review using our clinic review tool, if applicable. No additional management support is needed unless otherwise documented below in the visit note. 

## 2016-09-11 NOTE — Progress Notes (Signed)
Patient presents to clinic today for follow-up of hypertension. Patient is currently on a regimen of amlodipine 10 mg, Atenolol 25 mg, Hydralazine 25 mg TID and Imdur 30 mg daily. Is taking medication as directed with the exception of the Imdur started by Cardiology. Endorses stopping medication due to headaches. Patient denies palpitations, lightheadedness, dizziness, vision changes or frequent headaches. Has noted some intermittent R sided chest discomfort over the past few weeks. Endorses worse with movement of torso and R shoulder. Denies change in pain with deep breathing or exertion. Patient denies known trauma or injury but does note some episodes of heavy lifting.  Denies radiation of pain elsewhere. Denies pain at present.   Past Medical History:  Diagnosis Date  . Arthritis    "minor in my shoulders" (02/21/2016)  . Chicken pox   . Diverticulitis   . Frequent headaches    "maybe twice/week" (03/12/2016)  . Gout    Right Foot  . History of blood transfusion    "said my blood was low"  . History of gout   . Hyperlipidemia   . Hypertension   . Hypothyroidism   . Migraine    "maybe twice/year" (02/21/2016)  . Palpitations    "doctor thought it was related to my thyroid"  . Sleep apnea    "I don't wear my mask" (02/21/2016)    Current Outpatient Prescriptions on File Prior to Visit  Medication Sig Dispense Refill  . acetaminophen (TYLENOL) 325 MG tablet Take 650 mg by mouth every 6 (six) hours as needed (pain).     Marland Kitchen allopurinol (ZYLOPRIM) 100 MG tablet TAKE 2 TABLETS BY MOUTH DAILY. 60 tablet 1  . amLODipine (NORVASC) 10 MG tablet TAKE 1 TABLET BY MOUTH EVERY DAY 30 tablet 3  . aspirin EC 81 MG EC tablet Take 1 tablet (81 mg total) by mouth daily.    Marland Kitchen atenolol (TENORMIN) 25 MG tablet Take 25 mg by mouth daily at 12 noon.   3  . hydrALAZINE (APRESOLINE) 50 MG tablet Take 1 tablet (50 mg total) by mouth 3 (three) times daily. 90 tablet 11  . levothyroxine (SYNTHROID, LEVOTHROID)  200 MCG tablet TAKE 1 TABLET BY MOUTH EVERY DAY BEFORE BREAKFAST 30 tablet 1  . meclizine (ANTIVERT) 25 MG tablet Take 25 mg by mouth 3 (three) times daily as needed for dizziness. Reported on 03/20/2016    . nitroGLYCERIN (NITROSTAT) 0.4 MG SL tablet Place 1 tablet (0.4 mg total) under the tongue every 5 (five) minutes x 3 doses as needed for chest pain. 25 tablet 12  . OVER THE COUNTER MEDICATION Place 1 drop into both eyes daily as needed (dry eyes). Over the counter lubricating eye drops    . ranitidine (ZANTAC) 300 MG tablet TAKE 0.5 TABLETS BY MOUTH 2 TIMES DAILY. 60 tablet 1   No current facility-administered medications on file prior to visit.     Allergies  Allergen Reactions  . Pineapple Swelling    Reaction to fresh pineapple - tongue swelling    Family History  Problem Relation Age of Onset  . Heart disease Mother   . Heart attack Mother     died from it  . Hypertension Mother   . Arthritis Mother   . Stroke Father   . Cancer Maternal Grandmother   . Heart attack Maternal Aunt   . Renal Disease Maternal Aunt   . Multiple myeloma Maternal Uncle   . Emphysema Maternal Uncle   . Heart disease Sister   .  Thyroid disease Sister   . Healthy Son     x3  . Healthy Daughter     x5    Social History   Social History  . Marital status: Widowed    Spouse name: N/A  . Number of children: N/A  . Years of education: N/A   Occupational History  . Retired    Social History Main Topics  . Smoking status: Never Smoker  . Smokeless tobacco: Never Used  . Alcohol use No  . Drug use: No  . Sexual activity: No   Other Topics Concern  . Not on file   Social History Narrative   Lives alone, family helps in her care.   Review of Systems - See HPI.  All other ROS are negative.  BP 130/68 (BP Location: Left Arm, Patient Position: Sitting, Cuff Size: Normal)   Pulse (!) 58   Temp 98.3 F (36.8 C) (Oral)   Wt 172 lb (78 kg)   SpO2 100%   BMI 30.47 kg/m   Physical  Exam  Constitutional: She is oriented to person, place, and time and well-developed, well-nourished, and in no distress.  HENT:  Head: Normocephalic and atraumatic.  Eyes: Conjunctivae are normal.  Cardiovascular: Normal rate, regular rhythm, normal heart sounds and intact distal pulses.   Pulmonary/Chest: Effort normal and breath sounds normal. No respiratory distress. She has no wheezes. She has no rales. She exhibits no tenderness.  Neurological: She is alert and oriented to person, place, and time.  Skin: Skin is warm and dry. No rash noted.  Psychiatric: Affect normal.  Vitals reviewed.   Recent Results (from the past 2160 hour(s))  CBC     Status: Abnormal   Collection Time: 07/01/16  2:10 PM  Result Value Ref Range   WBC 7.0 4.0 - 10.5 K/uL   RBC 4.72 3.87 - 5.11 MIL/uL   Hemoglobin 12.2 12.0 - 15.0 g/dL   HCT 38.0 36.0 - 46.0 %   MCV 80.5 78.0 - 100.0 fL   MCH 25.8 (L) 26.0 - 34.0 pg   MCHC 32.1 30.0 - 36.0 g/dL   RDW 14.9 11.5 - 15.5 %   Platelets 447 (H) 150 - 400 K/uL  Comprehensive metabolic panel     Status: Abnormal   Collection Time: 07/01/16  2:10 PM  Result Value Ref Range   Sodium 137 135 - 145 mmol/L   Potassium 3.6 3.5 - 5.1 mmol/L   Chloride 107 101 - 111 mmol/L   CO2 22 22 - 32 mmol/L   Glucose, Bld 134 (H) 65 - 99 mg/dL   BUN 22 (H) 6 - 20 mg/dL   Creatinine, Ser 1.46 (H) 0.44 - 1.00 mg/dL   Calcium 9.6 8.9 - 10.3 mg/dL   Total Protein 8.3 (H) 6.5 - 8.1 g/dL   Albumin 3.9 3.5 - 5.0 g/dL   AST 19 15 - 41 U/L   ALT 15 14 - 54 U/L   Alkaline Phosphatase 95 38 - 126 U/L   Total Bilirubin 0.7 0.3 - 1.2 mg/dL   GFR calc non Af Amer 33 (L) >60 mL/min   GFR calc Af Amer 39 (L) >60 mL/min    Comment: (NOTE) The eGFR has been calculated using the CKD EPI equation. This calculation has not been validated in all clinical situations. eGFR's persistently <60 mL/min signify possible Chronic Kidney Disease.    Anion gap 8 5 - 15  Magnesium     Status: None     Collection  Time: 07/01/16  2:10 PM  Result Value Ref Range   Magnesium 2.1 1.7 - 2.4 mg/dL  Protime-INR     Status: None   Collection Time: 07/01/16  2:10 PM  Result Value Ref Range   Prothrombin Time 12.3 11.6 - 15.2 seconds   INR 0.89 0.00 - 1.49  Troponin I (order at [facility])     Status: None   Collection Time: 07/01/16  2:10 PM  Result Value Ref Range   Troponin I <0.03 <0.03 ng/mL  TSH     Status: Abnormal   Collection Time: 07/10/16 11:09 AM  Result Value Ref Range   TSH 13.09 (H) 0.35 - 4.50 uIU/mL  TSH     Status: None   Collection Time: 08/14/16  8:51 AM  Result Value Ref Range   TSH 1.29 0.35 - 4.50 uIU/mL   Assessment/Plan: 1. Atypical chest pain EKG obtained due to absence of reproducible symptoms on exam. EKG reveals sinus bradycardia with chronic RBBB. Serial EKG comparison performed. No change from prior EKG. No alarm symptoms. Do not feel is cardiac as when present, is worsened with movement. Suspect MSK etiology. She has been encouraged to schedule follow-up with Cardiology. Also she is to return to clinic if/when symptoms recur. Take medications as directed. - EKG 12-Lead  2. Encounter for immunization Flu shot given by nursing staff. - Flu Vaccine QUAD 36+ mos IM  3. Essential hypertension, benign BP normotensive. Discussed importance with compliance with medication. Patient is to contact Cardiology for follow-up as she is overdue. She is also to make them aware that she stopped the Imdur.    Leeanne Rio, PA-C

## 2016-09-11 NOTE — Patient Instructions (Signed)
Please continue medications as directed.  Be consistent with diet and fluid intake. I am calling your Cardiology office to make sure they can see you sooner. If you note any chest pain, shortness of breath, lightheadedness or dizziness, please go to the ER.

## 2016-09-14 ENCOUNTER — Encounter: Payer: Self-pay | Admitting: Physician Assistant

## 2016-09-18 IMAGING — DX DG CHEST 2V
2 series · 2 of 2 positions shown · non-contrast
Comparison: None.

CLINICAL DATA: Chest pain, mid sternal radiating to the back.
History of cardiac arrhythmia and hypertension.

EXAM:
CHEST  2 VIEW

[chest pa]
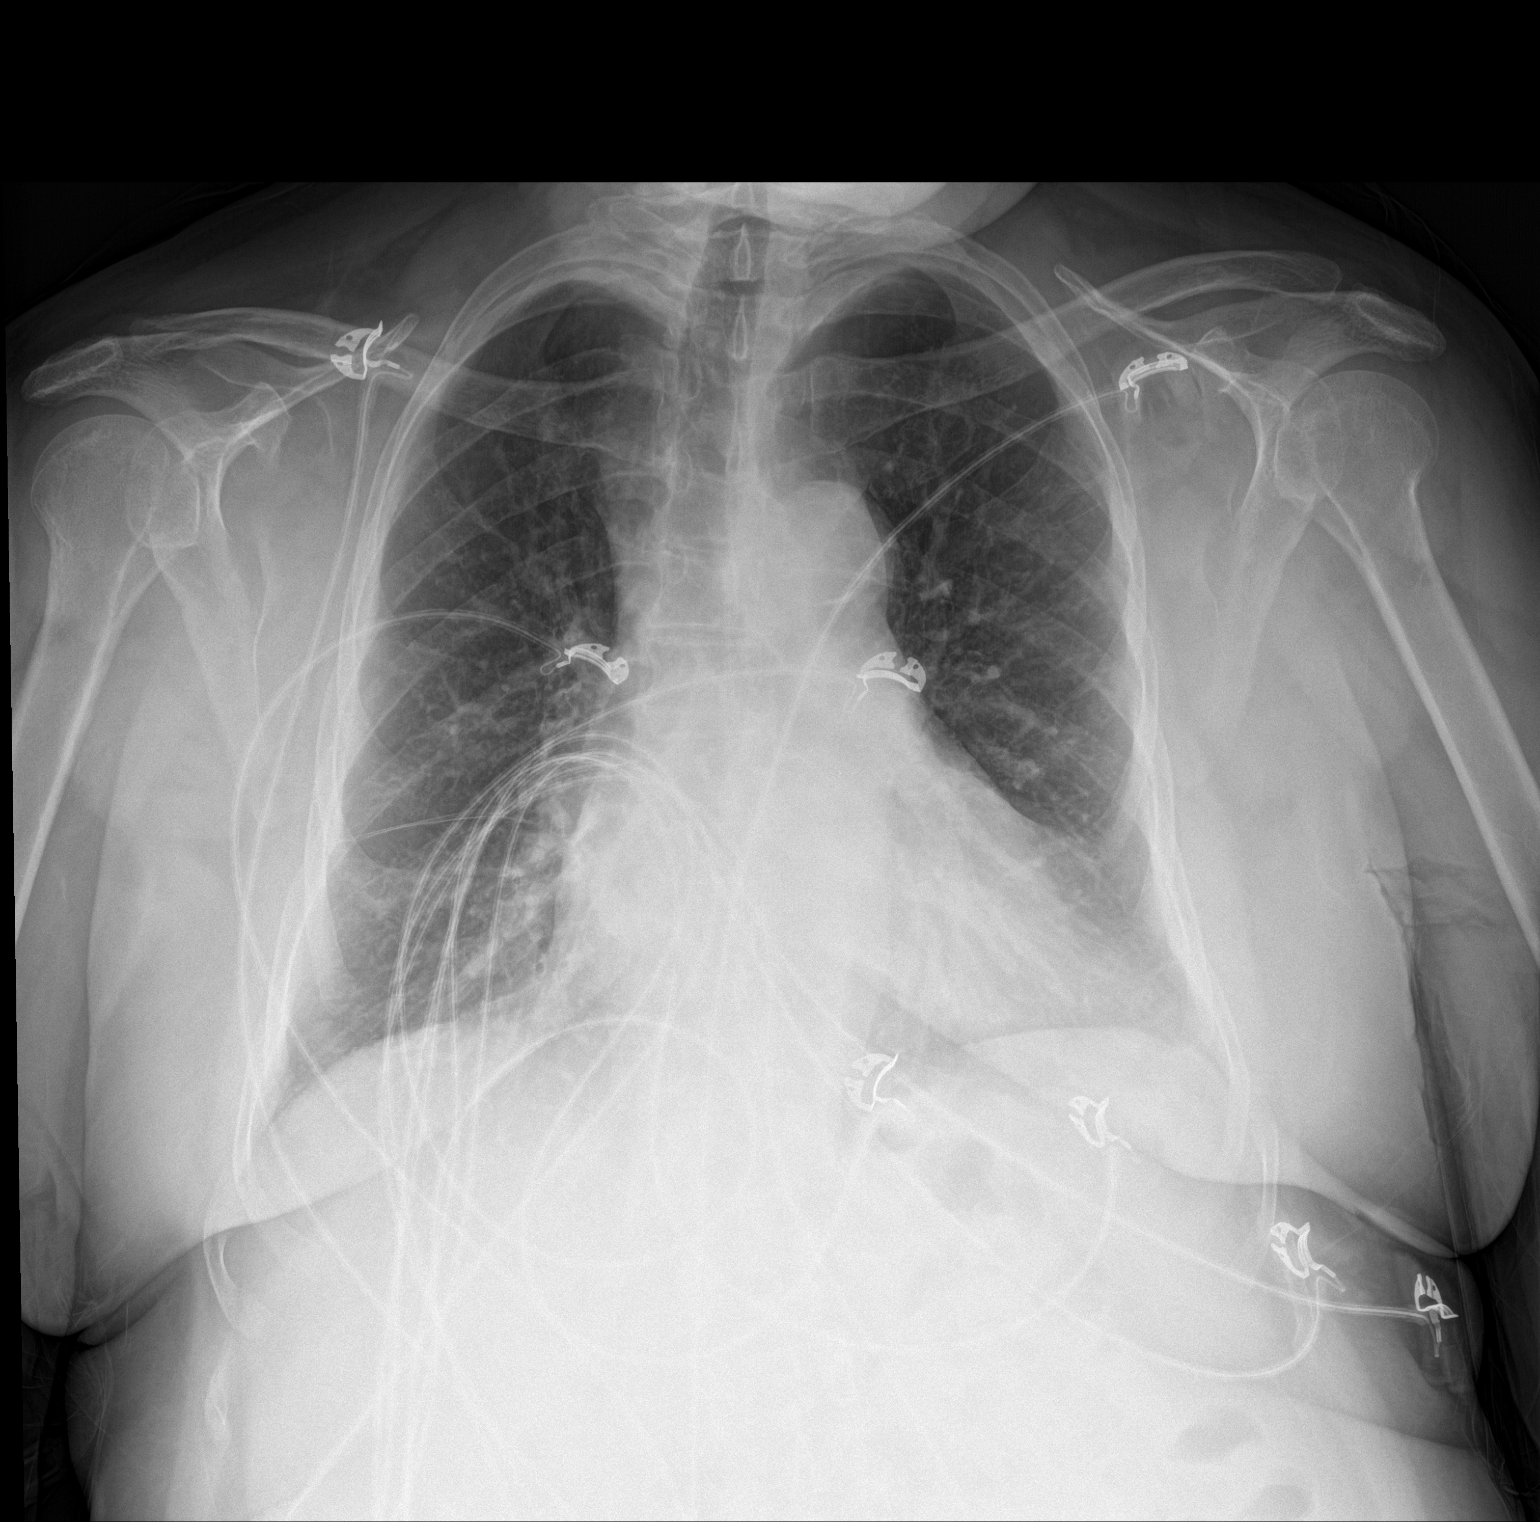

[chest lat]
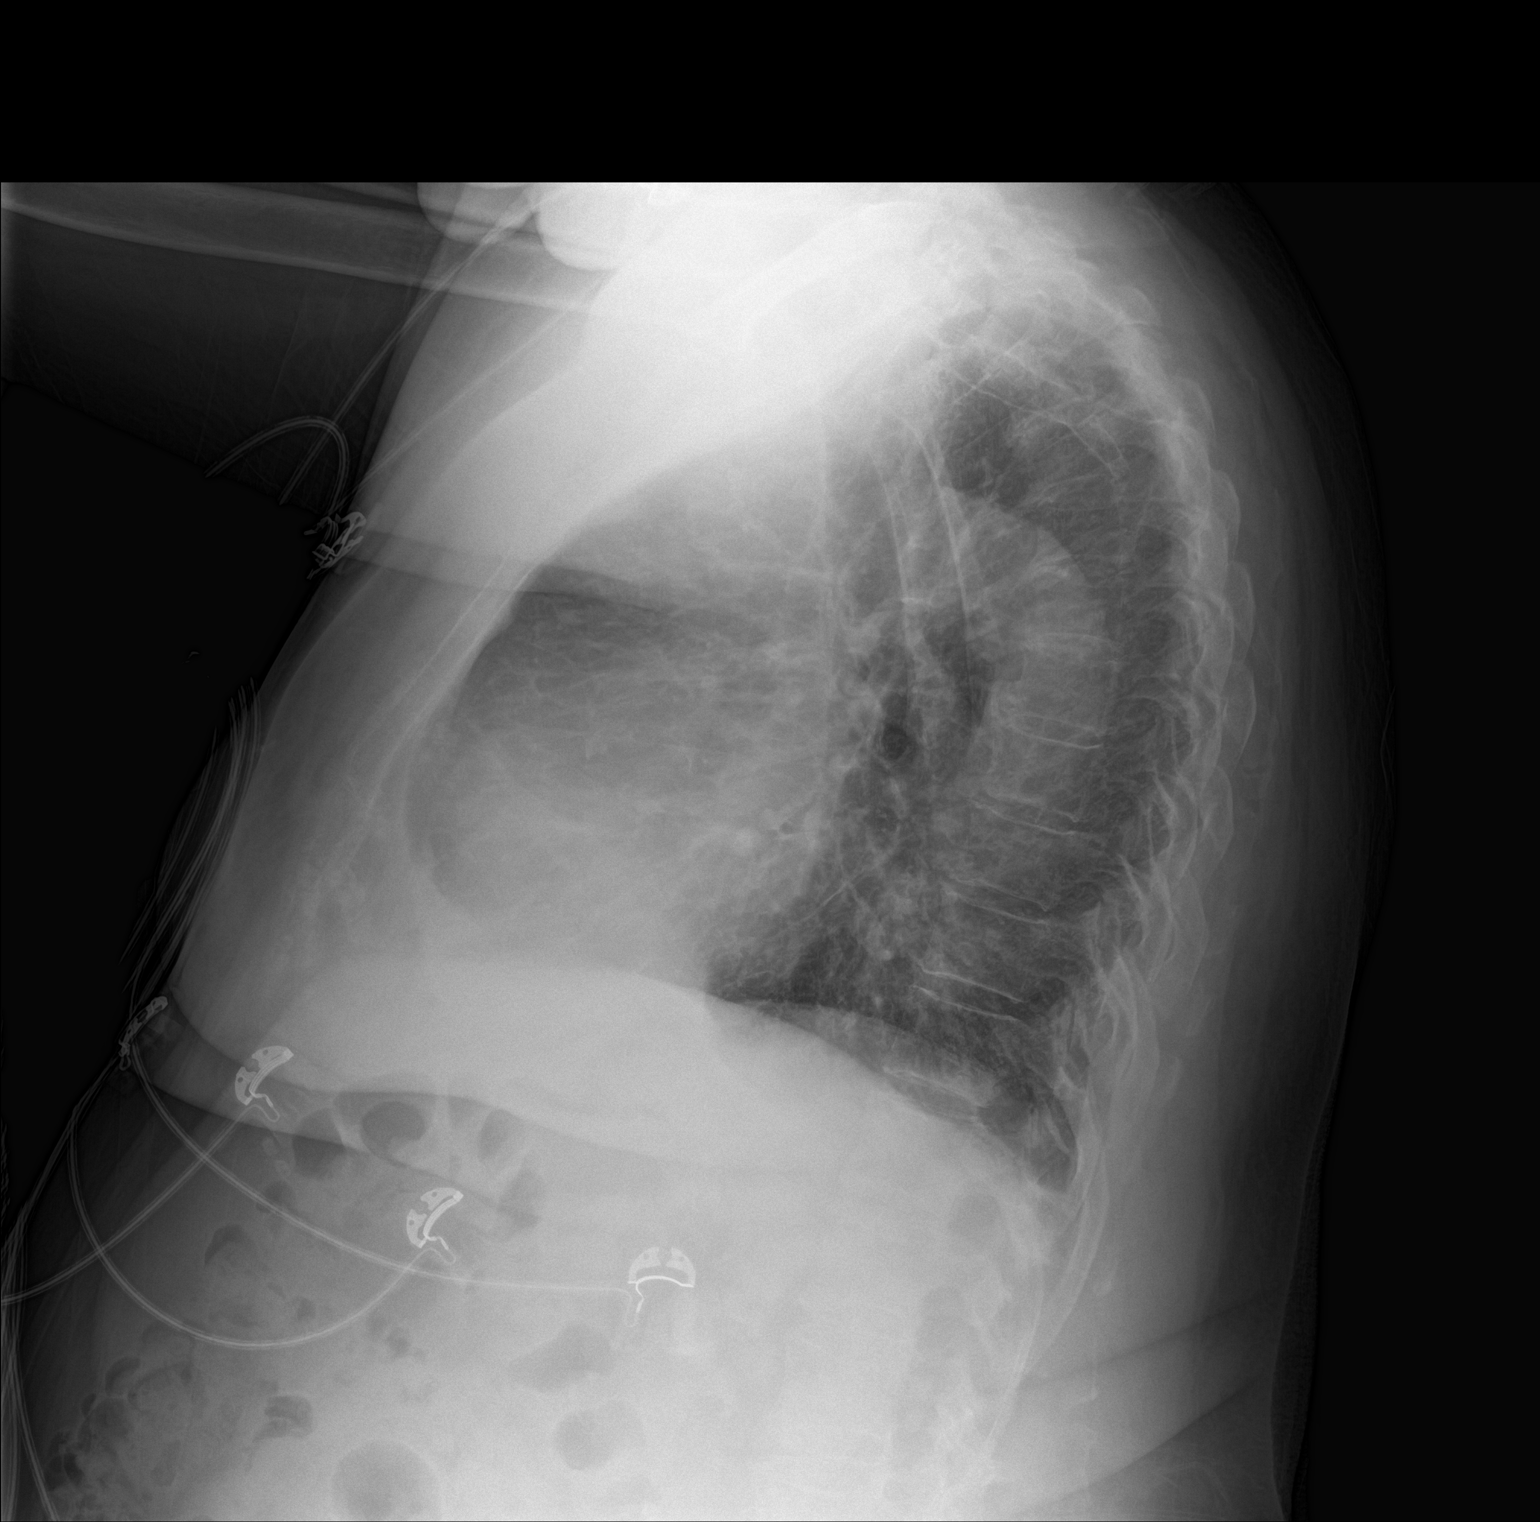

[2 of 2 positions shown; findings below may reference images not displayed]

FINDINGS: Heart size is upper limits of normal. Atherosclerotic calcifications
at the aortic arch. Mild peribronchial thickening in the lower chest
may be chronic. There is no significant airspace disease or
pulmonary edema. Negative for a pneumothorax. No suspicious bone
findings. No large pleural effusions.
IMPRESSION: Mild peribronchial thickening may be chronic. No acute chest
findings.

## 2016-09-18 IMAGING — CT CT CTA ABD/PEL W/CM AND/OR W/O CM
2 of 9 series · 14 of 46 positions shown, 16 images · IV contrast (APPLIED)
Comparison: Chest radiograph 02/21/2016

CLINICAL DATA: Chest pain that radiates to the back. Elevated blood
pressure. Evaluate for an aortic dissection.

EXAM:
CT ANGIOGRAPHY CHEST, ABDOMEN AND PELVIS
TECHNIQUE: Multidetector CT imaging through the chest, abdomen and pelvis was
performed using the standard protocol during bolus administration of
intravenous contrast. Multiplanar reconstructed images and MIPs were
obtained and reviewed to evaluate the vascular anatomy.
CONTRAST:  100mL OMNIPAQUE IOHEXOL 350 MG/ML SOLN

[Series 5: axial arterial · axial · arterial · 0.82mm/px · z∈[-516,+15]mm · 11 of 197 slices shown, 13 images]
[im 10/197  soft-tissue]
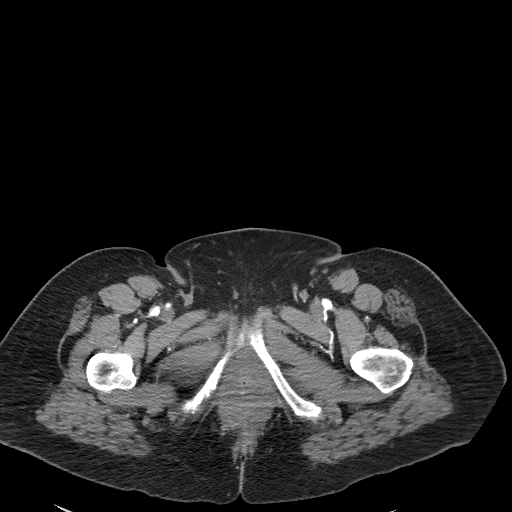
[im 10/197  bone]
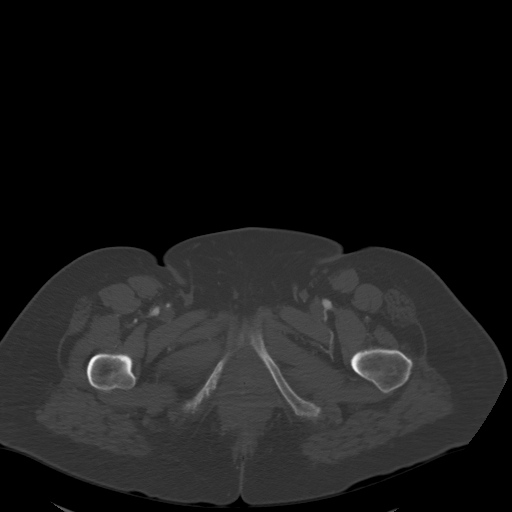
[im 30/197  soft-tissue]
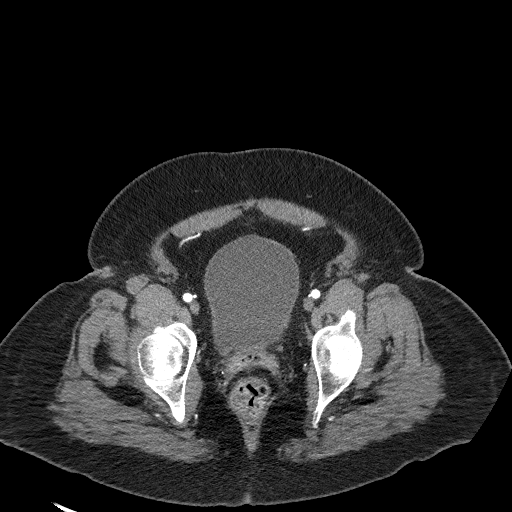
[im 50/197  soft-tissue]
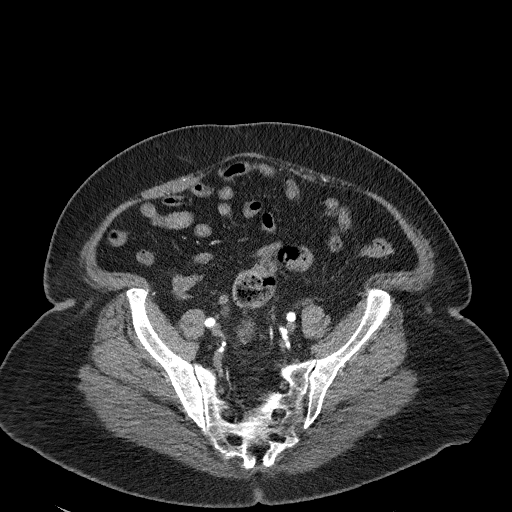
[im 69/197  soft-tissue]
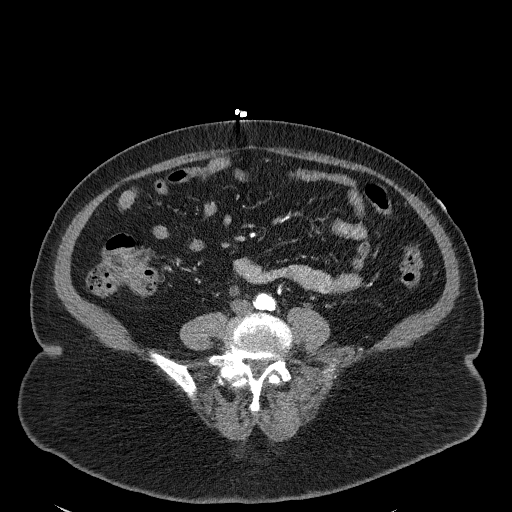
[im 79/197  soft-tissue]
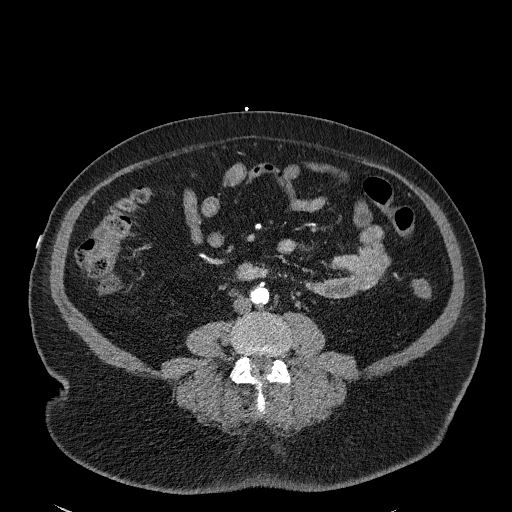
[im 99/197  soft-tissue]
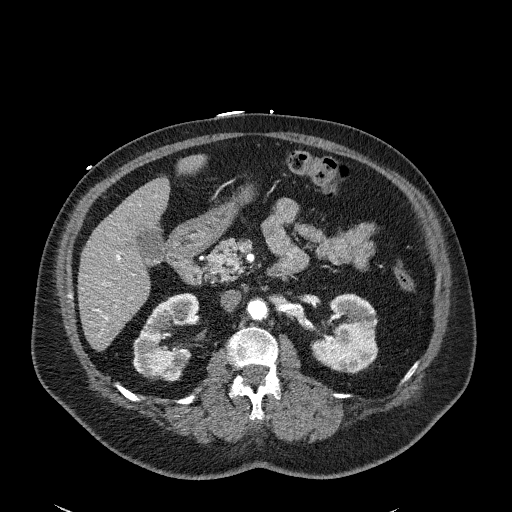
[im 118/197  soft-tissue]
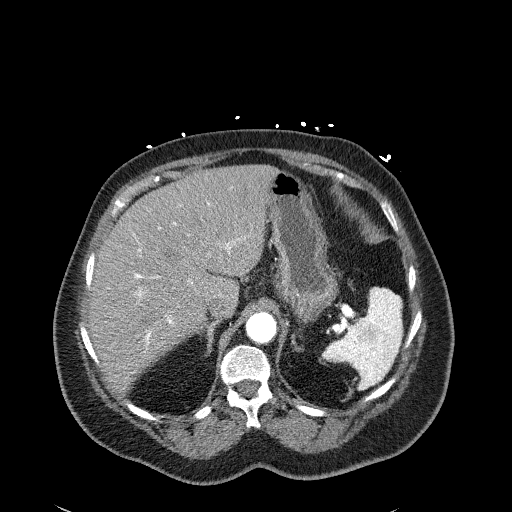
[im 128/197  soft-tissue]
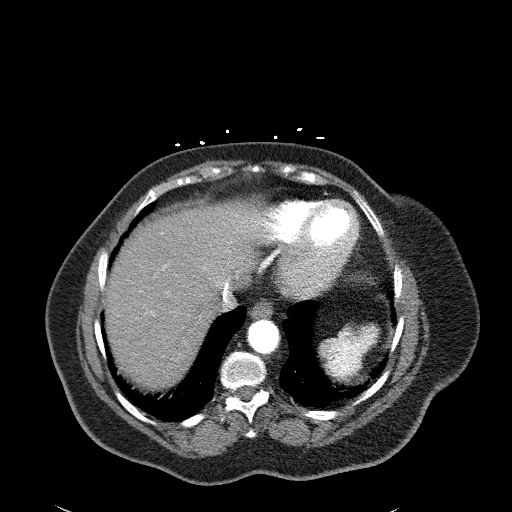
[im 148/197  soft-tissue]
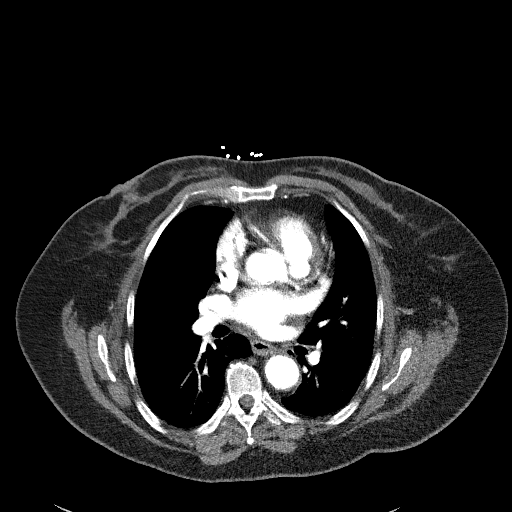
[im 148/197  bone]
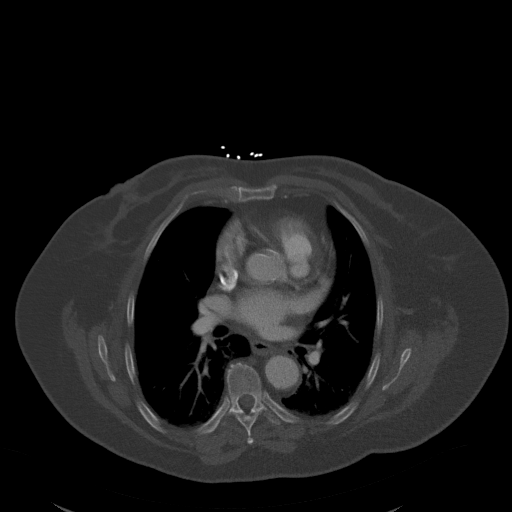
[im 167/197  soft-tissue]
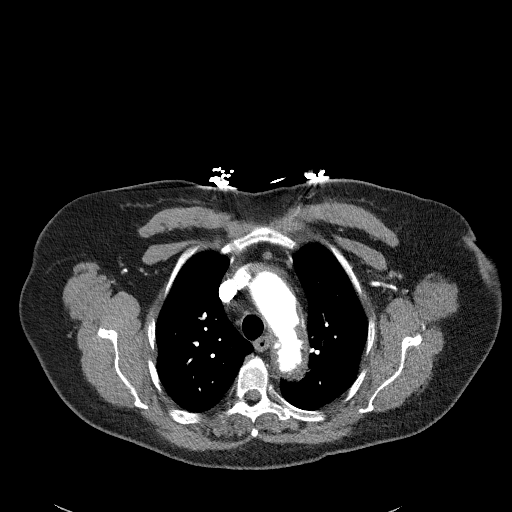
[im 187/197  soft-tissue]
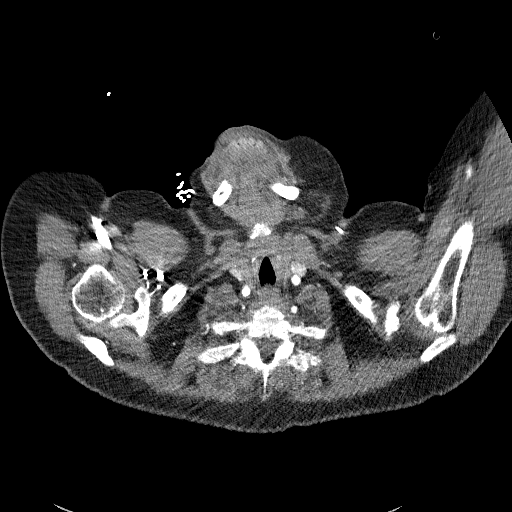

[Series 7: coronals · coronal · 0.97mm/px · 3 of 145 slices shown]
[im 37/145  soft-tissue]
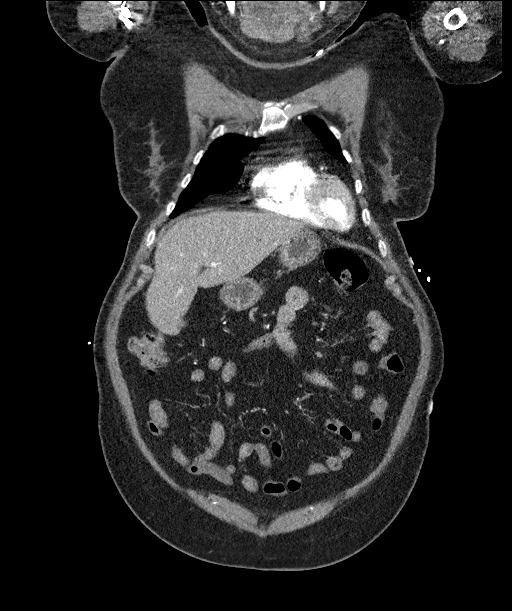
[im 73/145  soft-tissue]
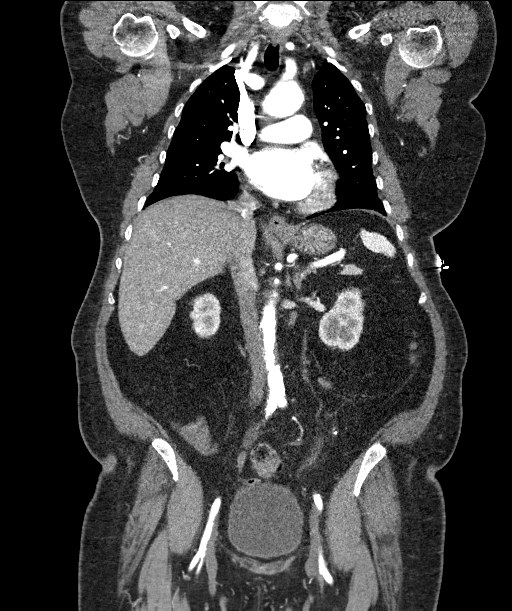
[im 109/145  soft-tissue]
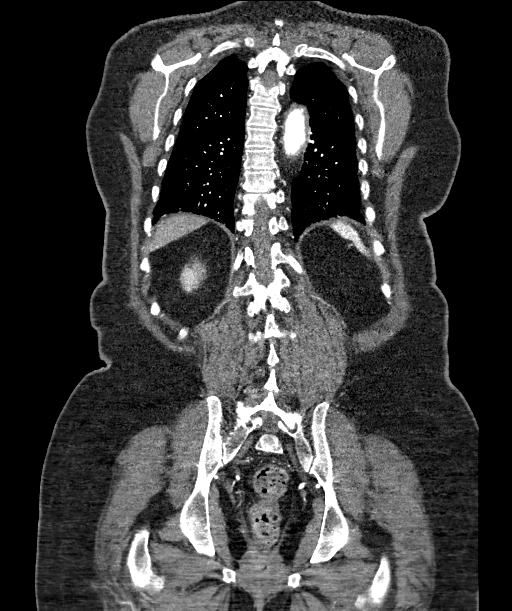

[14 of 46 positions shown; findings below may reference images not displayed]

FINDINGS: CTA CHEST FINDINGS

No evidence for an aortic intramural hematoma or dissection. There
is extensive mural thrombus along the posterior aortic arch and
proximal descending thoracic aorta. Evidence for ulcerative plaques
in this region. There is motion artifact at the aortic root. Normal
caliber of the thoracic aorta without aneurysm. Great vessels are
patent. Right vertebral artery is very small. No evidence for chest
lymphadenopathy. No evidence for a mediastinal hematoma. There may
be a small nodule involving the inferior right thyroid lobe. There
is no significant pericardial or pleural fluid. Central pulmonary
arteries are patent.

Trachea and mainstem bronchi are patent. Focal thickening at the
confluence of the right middle and right major fissures on sequence
6, image 30. This thickening or nodularity roughly measures 4 mm on
sequence 6, image 30. Mild atelectasis or scarring in the lingula.
No significant airspace disease or consolidation the lungs. No acute
bone abnormality.

Review of the MIP images confirms the above findings.

CTA ABDOMEN AND PELVIS FINDINGS

Diffuse atherosclerotic disease and plaque in the abdominal aorta
without aneurysm. Mild plaque at the origin the celiac trunk and SMA
without significant stenosis. Main branches of the celiac trunk are
patent. There appears to be moderate to severe stenosis in the
proximal left renal artery with post stenotic dilatation. There is
also concern for significant stenosis at the origin of the right
renal artery. Inferior mesenteric artery is patent.

Normal appearance of the liver without focal abnormality.
High-density material in the gallbladder suggestive for stones or
sludge without inflammatory changes. No significant biliary duct
dilatation. Normal appearance of the pancreas without inflammation
or duct dilatation. There is a poorly defined low-density lesion in
the splenic hilum that extends up to the superior aspect. Unclear if
this is perfusion abnormality or true lesion on this arterial phase
imaging. Normal appearance of the adrenal glands. Diffuse scarring
in the right kidney with a cortical calcification in the interpolar
region and evidence for a stone in the lower pole measuring roughly
4 mm. No significant hydronephrosis in the right kidney but there is
focal dilatation of the mid right ureter without evidence of a
stone. Probable cyst in the right kidney lower pole. The left kidney
is mildly lobulated without hydronephrosis. Fluid in the urinary
bladder without gross abnormality. Uterus has been removed. No acute
abnormality to the stomach, small bowel or colon. Normal appearance
of the appendix. There is no significant abdominal or pelvic
lymphadenopathy. No acute bone abnormality.

Review of the MIP images confirms the above findings.
IMPRESSION: Negative for aortic dissection or aneurysm. Diffuse atherosclerotic
disease involving the aorta and visceral arteries. In particular,
there is significant plaque in the posterior aortic arch and
proximal descending thoracic aorta with foci of ulcerative plaque.

Bilateral renal artery stenosis. Renal artery stenosis may be
hemodynamically significant.

Indeterminate low-density areas in the spleen. Unclear if this is a
perfusion abnormality or a true lesion. This could be more
definitively evaluated with MRI.

Both kidneys are very lobulated and some this may be related to
scarring, particularly in the right kidney. Evidence for a
nonobstructive right kidney stone.

4 mm nodular density along the right lung fissures as described. If
the patient is at high risk for bronchogenic carcinoma, follow-up
chest CT at 1 year is recommended. If the patient is at low risk, no
follow-up is needed. This recommendation follows the consensus
statement: Guidelines for Management of Small Pulmonary Nodules
Detected on CT Scans: A Statement from the [HOSPITAL] as

## 2016-10-12 ENCOUNTER — Other Ambulatory Visit: Payer: Self-pay | Admitting: *Deleted

## 2016-10-12 MED ORDER — LOVASTATIN 20 MG PO TABS: 20.0000 mg | ORAL_TABLET | Freq: Every day | ORAL | 0 refills | Status: DC

## 2016-10-23 ENCOUNTER — Other Ambulatory Visit: Payer: Self-pay | Admitting: Physician Assistant

## 2016-10-23 DIAGNOSIS — K219 Gastro-esophageal reflux disease without esophagitis: Secondary | ICD-10-CM

## 2016-10-23 MED ORDER — LEVOTHYROXINE SODIUM 200 MCG PO TABS: ORAL_TABLET | ORAL | 1 refills | Status: DC

## 2016-10-23 MED ORDER — RANITIDINE HCL 300 MG PO TABS: ORAL_TABLET | ORAL | 1 refills | Status: DC

## 2016-10-23 MED ORDER — LOVASTATIN 20 MG PO TABS: 20.0000 mg | ORAL_TABLET | Freq: Every day | ORAL | 1 refills | Status: DC

## 2016-10-25 ENCOUNTER — Other Ambulatory Visit: Payer: Self-pay | Admitting: Physician Assistant

## 2016-10-26 MED ORDER — ALLOPURINOL 100 MG PO TABS: 200.0000 mg | ORAL_TABLET | Freq: Every day | ORAL | 1 refills | Status: DC

## 2016-10-26 MED ORDER — LEVOTHYROXINE SODIUM 200 MCG PO TABS: ORAL_TABLET | ORAL | 1 refills | Status: DC

## 2016-10-26 NOTE — Telephone Encounter (Signed)
Rx request to pharmacy/[facility]  

## 2016-10-29 ENCOUNTER — Telehealth: Payer: Self-pay | Admitting: Physician Assistant

## 2016-10-29 NOTE — Telephone Encounter (Signed)
Pharmacy called requesting new medications for patient.   atenolol (TENORMIN) 25 MG tablet is on back order so pharmacist suggests to order carvedilol or metoprolol   Isosorbide    allopurinol (ZYLOPRIM) 100 MG tablet  hydrALAZINE (APRESOLINE) 50 MG tablet   Pharmacy: [facility]    Please advise.

## 2016-10-30 MED ORDER — ATENOLOL 25 MG PO TABS: 25.0000 mg | ORAL_TABLET | Freq: Every day | ORAL | 0 refills | Status: DC

## 2016-10-30 MED ORDER — ALLOPURINOL 100 MG PO TABS: 200.0000 mg | ORAL_TABLET | Freq: Every day | ORAL | 0 refills | Status: DC

## 2016-10-30 NOTE — Telephone Encounter (Signed)
Imdur and Diovan were both d/c'd. She should be taking Atenolol and Hydralazine as directed. BP normotensive at last visit with me on this regimen. Would have her continue current regimen. FU for BP check with nurse in 1 week. FU in office in December.

## 2016-10-30 NOTE — Telephone Encounter (Addendum)
Atenolol and Allopurinol sent to [facility]; per provider he does not px the Hydralazine [px by Cardiology] and the Isosorbide was removed from patient Med List d/t side effects [reported H/A]; patient informed, understood. Patient does state that she had requested Valsartan to be filled prior as well [Valsartan-HCTZ] shows d/c at discharge in March; pt states she has not taken since September, reports BP has been 'Ok" and she has not had any fluid retention.  Informed patient that I would have provider review chart and call her back RE: the Valsartan. Also, she would like to schedule a F/U appointment for December; I have spoken to her about Dr. Lorelei Pont & Dr. Nani Ravens and informed her that I would let you make a recommendation for her as well. Please Advise/SLS 11/10

## 2016-11-03 NOTE — Telephone Encounter (Signed)
Patient informed, understood & agreed; needs late appointment for Transfer of Care ["daughter gets off work at [employer] and it takes her about one hour to get to patient for p/u and get back to our office"]. Would like to get her in with Dr. Lorelei Pont on a Thursday evening, but no 30-min appointment until January '18. Informed patient that I will speak with provider when she returns to office tomorrow morning and try to find her an opening in December; will call pt back afterwards and schedule bother Transfer of Care and Nurse visit appointments/SLS 11/14

## 2016-11-04 NOTE — Telephone Encounter (Signed)
Spoke with Dr Lorelei Pont this morning about pt transfer and transportation issue; she agreed to see patient with two slots put together in evening on Thursday, 12/03/16 at 6:00pm; patient informed, understood & agreed, appointment scheduled/SLS 11/15

## 2016-11-05 ENCOUNTER — Ambulatory Visit (INDEPENDENT_AMBULATORY_CARE_PROVIDER_SITE_OTHER): Payer: Commercial Managed Care - HMO | Admitting: Interventional Cardiology

## 2016-11-05 ENCOUNTER — Encounter: Payer: Self-pay | Admitting: Interventional Cardiology

## 2016-11-05 VITALS — BP 124/82 | HR 57 | Ht 64.0 in | Wt 173.8 lb

## 2016-11-05 DIAGNOSIS — N183 Chronic kidney disease, stage 3 unspecified: Secondary | ICD-10-CM

## 2016-11-05 DIAGNOSIS — I10 Essential (primary) hypertension: Secondary | ICD-10-CM

## 2016-11-05 DIAGNOSIS — I701 Atherosclerosis of renal artery: Secondary | ICD-10-CM | POA: Diagnosis not present

## 2016-11-05 DIAGNOSIS — E785 Hyperlipidemia, unspecified: Secondary | ICD-10-CM | POA: Diagnosis not present

## 2016-11-05 DIAGNOSIS — R079 Chest pain, unspecified: Secondary | ICD-10-CM

## 2016-11-05 MED ORDER — NITROGLYCERIN 0.4 MG SL SUBL: 0.4000 mg | SUBLINGUAL_TABLET | SUBLINGUAL | 5 refills | Status: DC | PRN

## 2016-11-05 MED ORDER — AMLODIPINE BESYLATE 10 MG PO TABS: 10.0000 mg | ORAL_TABLET | Freq: Every day | ORAL | 3 refills | Status: DC

## 2016-11-05 NOTE — Progress Notes (Signed)
Cardiology Office Note    Date:  11/05/2016   ID:  Carolyn Myers, DOB 31-May-1936, MRN [id number]  PCP:  Leeanne Rio, PA-C  Cardiologist: Sinclair Grooms, MD   Chief Complaint  Patient presents with  . Chest Pain    History of Present Illness:  Carolyn Myers is a 80 y.o. female history of hypertension, hyperlipidemia, renal artery stenosis, Chest pain, and hypothyroidism. In March 2017 CT of the abdomen and pelvis showed bilateral renal artery stenosis, 4 mm nodular density in the right lung, nonobstructive right kidney stone. She did have mildly elevated troponin in the setting of hypertensive urgency. She underwent nuclear stress test which was read as low risk, EF 55%, mild inferior wall attenuation without associated wall motion abnormality, no evidence of ischemia, mild septal hypokinesis. Duplex scan of the kidney demonstrated a 60% right renal artery stenosis. 70% celiac stenosis was noted.  Since I last saw her she has had recurrent episodes of chest discomfort panel one occasion was completely resolved by a GI cocktail in the emergency room. She has no exertional chest discomfort. As noted above the nuclear stress test was low risk. We have started isosorbide mononitrate but it caused severe headaches. Chest discomfort when she has it is not responsive to nitroglycerin.  Past Medical History:  Diagnosis Date  . Arthritis    "minor in my shoulders" (02/21/2016)  . Chicken pox   . Diverticulitis   . Frequent headaches    "maybe twice/week" (03/12/2016)  . Gout    Right Foot  . History of blood transfusion    "said my blood was low"  . History of gout   . Hyperlipidemia   . Hypertension   . Hypothyroidism   . Migraine    "maybe twice/year" (02/21/2016)  . Palpitations    "doctor thought it was related to my thyroid"  . Sleep apnea    "I don't wear my mask" (02/21/2016)    Past Surgical History:  Procedure Laterality Date  . ABDOMINAL HYSTERECTOMY      "for fibroid tumors"  . TUBAL LIGATION      Current Medications: Outpatient Medications Prior to Visit  Medication Sig Dispense Refill  . acetaminophen (TYLENOL) 325 MG tablet Take 650 mg by mouth every 6 (six) hours as needed (pain).     Marland Kitchen allopurinol (ZYLOPRIM) 100 MG tablet Take 2 tablets (200 mg total) by mouth daily. 180 tablet 0  . aspirin EC 81 MG EC tablet Take 1 tablet (81 mg total) by mouth daily.    Marland Kitchen atenolol (TENORMIN) 25 MG tablet Take 1 tablet (25 mg total) by mouth daily at 12 noon. 90 tablet 0  . hydrALAZINE (APRESOLINE) 50 MG tablet Take 1 tablet (50 mg total) by mouth 3 (three) times daily. 90 tablet 11  . levothyroxine (SYNTHROID, LEVOTHROID) 200 MCG tablet TAKE 1 TABLET BY MOUTH EVERY DAY BEFORE BREAKFAST 30 tablet 1  . lovastatin (MEVACOR) 20 MG tablet Take 1 tablet (20 mg total) by mouth at bedtime. 90 tablet 1  . meclizine (ANTIVERT) 25 MG tablet Take 25 mg by mouth 3 (three) times daily as needed for dizziness. Reported on 03/20/2016    . OVER THE COUNTER MEDICATION Place 1 drop into both eyes daily as needed (dry eyes). Over the counter lubricating eye drops    . ranitidine (ZANTAC) 300 MG tablet TAKE 0.5 TABLETS BY MOUTH 2 TIMES DAILY. 90 tablet 1  . amLODipine (NORVASC) 10 MG tablet TAKE 1  TABLET BY MOUTH EVERY DAY 30 tablet 3  . nitroGLYCERIN (NITROSTAT) 0.4 MG SL tablet Place 1 tablet (0.4 mg total) under the tongue every 5 (five) minutes x 3 doses as needed for chest pain. 25 tablet 12   No facility-administered medications prior to visit.      Allergies:   Pineapple   Social History   Social History  . Marital status: Widowed    Spouse name: N/A  . Number of children: N/A  . Years of education: N/A   Occupational History  . Retired    Social History Main Topics  . Smoking status: Never Smoker  . Smokeless tobacco: Never Used  . Alcohol use No  . Drug use: No  . Sexual activity: No   Other Topics Concern  . None   Social History Narrative    Lives alone, family helps in her care.     Family History:  The patient's family history includes Arthritis in her mother; Cancer in her maternal grandmother; Emphysema in her maternal uncle; Healthy in her daughter and son; Heart attack in her maternal aunt and mother; Heart disease in her mother and sister; Hypertension in her mother; Multiple myeloma in her maternal uncle; Renal Disease in her maternal aunt; Stroke in her father; Thyroid disease in her sister.   ROS:   Please see the history of present illness.    Anxiety and fear related to the renal artery stenosis. Decreased appetite. Constipation. Headaches.  All other systems reviewed and are negative.   PHYSICAL EXAM:   VS:  BP 124/82   Pulse (!) 57   Ht '5\' 4"'$  (1.626 m)   Wt 173 lb 12.8 oz (78.8 kg)   BMI 29.83 kg/m    GEN: Well nourished, well developed, in no acute distress  HEENT: normal  Neck: no JVD, carotid bruits, or masses Cardiac: RRR; no murmurs, rubs, or gallops,no edema  Respiratory:  clear to auscultation bilaterally, normal work of breathing GI: soft, nontender, nondistended, + BS MS: no deformity or atrophy  Skin: warm and dry, no rash Neuro:  Alert and Oriented x 3, Strength and sensation are intact Psych: euthymic mood, full affect  Wt Readings from Last 3 Encounters:  11/05/16 173 lb 12.8 oz (78.8 kg)  09/11/16 172 lb (78 kg)  07/10/16 172 lb 4 oz (78.1 kg)      Studies/Labs Reviewed:   EKG:  EKG  Not repeated  Recent Labs: 07/01/2016: ALT 15; BUN 22; Creatinine, Ser 1.46; Hemoglobin 12.2; Magnesium 2.1; Platelets 447; Potassium 3.6; Sodium 137 08/14/2016: TSH 1.29   Lipid Panel    Component Value Date/Time   CHOL 200 02/22/2016 0024   TRIG 77 02/22/2016 0024   HDL 58 02/22/2016 0024   CHOLHDL 3.4 02/22/2016 0024   VLDL 15 02/22/2016 0024   LDLCALC 127 (H) 02/22/2016 0024    Additional studies/ records that were reviewed today include:   Vascular ultrasound/duplex study  03/14/16: Impressions Normal caliber abdominal aorta. >70% stenosis in the celiac axis. Normal and symmetrical kidney size. >60% right renal artery stenosis. Normal left renal artery. The IVC and renal veins are patent.   ASSESSMENT:    1. Essential hypertension, benign   2. Chest pain with high risk for cardiac etiology   3. Hyperlipidemia LDL goal <100   4. Renal artery stenosis ([place])   5. CKD (chronic kidney disease), stage III      PLAN:  In order of problems listed above:  1. Blood pressures under  control on the current medical regimen. Low salt diet is reemphasized. Compliance with medical regimen is also discussed. 2. The chest pain is sounding more and more like GI related discomfort. Isosorbide mononitrate was discontinued without any increase in episodes. GI cocktail and ranitidine seemed helped chest discomfort more than anything. We will continue her current regimen which includes a beta blocker. She can use nitroglycerin when necessary if it helps. 3. Lipids are not available in our current laboratory data. This disease are identified and managed by PCP. 4. This will be followed with serial duplex studies. Also need to monitor for the development of intestinal angina. 5. Kidney function is stable but will be followed serially.    Medication Adjustments/Labs and Tests Ordered: Current medicines are reviewed at length with the patient today.  Concerns regarding medicines are outlined above.  Medication changes, Labs and Tests ordered today are listed in the Patient Instructions below. Patient Instructions  Medication Instructions:  None  Labwork: None  Testing/Procedures: None  Follow-Up: Your physician wants you to follow-up in: 1 year with Dr. Tamala Julian.  You will receive a reminder letter in the mail two months in advance. If you don't receive a letter, please call our office to schedule the follow-up appointment.   Any Other Special Instructions Will Be Listed  Below (If Applicable).     If you need a refill on your cardiac medications before your next appointment, please call your pharmacy.      Signed, Sinclair Grooms, MD  11/05/2016 4:58 PM    [facility] [address], [place], [place]  [address] Phone: [phone]; Fax: [phone]

## 2016-11-05 NOTE — Patient Instructions (Signed)

## 2016-12-03 ENCOUNTER — Encounter: Payer: Self-pay | Admitting: Family Medicine

## 2016-12-03 ENCOUNTER — Ambulatory Visit (INDEPENDENT_AMBULATORY_CARE_PROVIDER_SITE_OTHER): Payer: Commercial Managed Care - HMO | Admitting: Family Medicine

## 2016-12-03 VITALS — BP 160/80 | HR 78 | Ht 63.25 in | Wt 170.6 lb

## 2016-12-03 DIAGNOSIS — K219 Gastro-esophageal reflux disease without esophagitis: Secondary | ICD-10-CM

## 2016-12-03 DIAGNOSIS — I10 Essential (primary) hypertension: Secondary | ICD-10-CM | POA: Diagnosis not present

## 2016-12-03 NOTE — Progress Notes (Signed)
Carolyn Myers at [facility] [address], [place], [place] [address] [phone]  Date:  12/03/2016   Name:  Carolyn Myers   DOB:  1936/01/24   MRN:  [id number]  PCP:  Carolyn Rio, PA-C    Chief Complaint: Transfer of Care (Pt is here for transfer of care visit. Former pt of [place], [place].  Pt would like to discuss bp, GERD, Thyroid. )   History of Present Illness:  Carolyn Myers is a 80 y.o. very pleasant female patient who presents with the following:  Former pt of [employer],. 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 Patient is currently on a regimen of amlodipine 10 mg, Atenolol 25 mg, Hydralazine 25 mg TID and Imdur 30 mg daily. Is taking medication as directed with the exception of the Imdur started by Cardiology. Endorses stopping medication due to headaches. Patient denies palpitations, lightheadedness, dizziness, vision changes or frequent headaches. Has noted some intermittent R sided chest discomfort over the past few weeks. Endorses worse with movement of torso and R shoulder. Denies change in pain with deep breathing or exertion. Patient denies known trauma or injury but does note some episodes of heavy lifting.  Denies radiation of pain elsewhere. Denies pain at present.   She has hypothyroidism, hyperlipidemia, HTN, renal artery stenosis She saw her cardiologist last month- Carolyn Myers  Here today with Carolyn Myers, her daughter She does not have any blurred vision, she has occasional ha but this is not new to her    BP Readings from Last 3 Encounters:  12/03/16 (!) 168/82  11/05/16 124/82  09/11/16 130/68   She has been feeling well overall.   No recent medication change  She is UTD on her flu shot and her pneumonia shots.   She does have acid reflux off an on She gets sx about twice a week She has not noted  any relationship to certain food She is on ranitidine BID- 150 BID She wonders if she might use   She does take her nitro on occasion.  However she notes that this does not really seem to help- suspects that her chest pain is due to reflux and that is why nitro does not help Lab Results  Component Value Date   TSH 1.29 08/14/2016      Patient Active Problem List   Diagnosis Date Noted  . Esophageal reflux 05/08/2016  . Renal artery stenosis ([place]) 02/22/2016  . Hypothyroidism 02/22/2016  . Chest pain with high risk for cardiac etiology 02/21/2016  . Gout   . SOB (shortness of breath) on exertion 10/06/2015  . Thyroid activity decreased 10/06/2015  . Accelerated hypertension 10/06/2015  . Essential hypertension, benign 11/26/2013  . Hyperlipidemia LDL goal <100 11/26/2013    Past Medical History:  Diagnosis Date  . Arthritis    "minor in my shoulders" (02/21/2016)  . Chicken pox   . Diverticulitis   . Frequent headaches    "maybe twice/week" (03/12/2016)  . Gout    Right Foot  . History of blood transfusion    "said my blood was low"  . History of gout   . Hyperlipidemia   . Hypertension   . Hypothyroidism   . Migraine    "maybe twice/year" (02/21/2016)  . Palpitations    "doctor thought it was related to my thyroid"  .  Sleep apnea    "I don't wear my mask" (02/21/2016)    Past Surgical History:  Procedure Laterality Date  . ABDOMINAL HYSTERECTOMY     "for fibroid tumors"  . TUBAL LIGATION      Social History  Substance Use Topics  . Smoking status: Never Smoker  . Smokeless tobacco: Never Used  . Alcohol use No    Family History  Problem Relation Age of Onset  . Heart disease Mother   . Heart attack Mother     died from it  . Hypertension Mother   . Arthritis Mother   . Stroke Father   . Cancer Maternal Grandmother   . Heart attack Maternal Aunt   . Renal Disease Maternal Aunt   . Multiple myeloma Maternal Uncle   . Emphysema Maternal Uncle   .  Heart disease Sister   . Thyroid disease Sister   . Healthy Son     x3  . Healthy Daughter     x5    Allergies  Allergen Reactions  . Pineapple Swelling    Reaction to fresh pineapple - tongue swelling    Medication list has been reviewed and updated.  Current Outpatient Prescriptions on File Prior to Visit  Medication Sig Dispense Refill  . acetaminophen (TYLENOL) 325 MG tablet Take 650 mg by mouth every 6 (six) hours as needed (pain).     Marland Kitchen allopurinol (ZYLOPRIM) 100 MG tablet Take 2 tablets (200 mg total) by mouth daily. 180 tablet 0  . amLODipine (NORVASC) 10 MG tablet Take 1 tablet (10 mg total) by mouth daily. 90 tablet 3  . aspirin EC 81 MG EC tablet Take 1 tablet (81 mg total) by mouth daily.    Marland Kitchen atenolol (TENORMIN) 25 MG tablet Take 1 tablet (25 mg total) by mouth daily at 12 noon. 90 tablet 0  . hydrALAZINE (APRESOLINE) 50 MG tablet Take 1 tablet (50 mg total) by mouth 3 (three) times daily. 90 tablet 11  . levothyroxine (SYNTHROID, LEVOTHROID) 200 MCG tablet TAKE 1 TABLET BY MOUTH EVERY DAY BEFORE BREAKFAST 30 tablet 1  . lovastatin (MEVACOR) 20 MG tablet Take 1 tablet (20 mg total) by mouth at bedtime. 90 tablet 1  . meclizine (ANTIVERT) 25 MG tablet Take 25 mg by mouth 3 (three) times daily as needed for dizziness. Reported on 03/20/2016    . nitroGLYCERIN (NITROSTAT) 0.4 MG SL tablet Place 1 tablet (0.4 mg total) under the tongue every 5 (five) minutes x 3 doses as needed for chest pain. 25 tablet 5  . OVER THE COUNTER MEDICATION Place 1 drop into both eyes daily as needed (dry eyes). Over the counter lubricating eye drops    . ranitidine (ZANTAC) 300 MG tablet TAKE 0.5 TABLETS BY MOUTH 2 TIMES DAILY. 90 tablet 1   No current facility-administered medications on file prior to visit.     Review of Systems:  As per HPI- otherwise negative.  No fever, chills, SOB, nausea, vomiting or rash    Physical Examination: Vitals:   12/03/16 1744 12/03/16 1752  BP: (!)  195/77 (!) 168/82  Pulse: 78    Vitals:   12/03/16 1744  Weight: 170 lb 9.6 oz (77.4 kg)  Height: 5' 3.25" (1.607 m)   Body mass index is 29.98 kg/m. Ideal Body Weight: Weight in (lb) to have BMI = 25: 142  GEN: WDWN, NAD, Non-toxic, A & O x 3 HEENT: Atraumatic, Normocephalic. Neck supple. No masses, No LAD. Ears and Nose: No  external deformity. CV: RRR, No M/G/R. No JVD. No thrill. No extra heart sounds. PULM: CTA B, no wheezes, crackles, rhonchi. No retractions. No resp. distress. No accessory muscle use. EXTR: No c/c/e NEURO Normal gait.  PSYCH: Normally interactive. Conversant. Not depressed or anxious appearing.  Calm demeanor.  Looks well, overweight   Assessment and Plan: Essential hypertension, benign  Gastroesophageal reflux disease, esophagitis presence not specified  Here today to establish care Her BP is high today but has been good at last 2 visits.  She will monitor and home and contact me with some readings. For the time being will not change her medications  Advised that taking prn alka seltzer is ok to do for her GERd She will see me in February of this year   Signed Lamar Blinks, MD

## 2016-12-03 NOTE — Progress Notes (Signed)
Pre visit review using our clinic review tool, if applicable. No additional management support is needed unless otherwise documented below in the visit note. 

## 2016-12-03 NOTE — Patient Instructions (Addendum)
Please keep an eye on your blood pressure- please check it a couple of times a week and send me a list in a few weeks.  If you are consistently higher than 150/90 we may need to adjust your medications.  Otherwise please see me in February for a recheck and labs

## 2017-01-05 ENCOUNTER — Other Ambulatory Visit: Payer: Self-pay | Admitting: Physician Assistant

## 2017-01-25 ENCOUNTER — Encounter: Payer: Self-pay | Admitting: Family Medicine

## 2017-02-03 ENCOUNTER — Ambulatory Visit: Payer: Commercial Managed Care - HMO | Admitting: Family Medicine

## 2017-02-04 ENCOUNTER — Ambulatory Visit: Payer: Commercial Managed Care - HMO | Admitting: Family Medicine

## 2017-02-08 ENCOUNTER — Ambulatory Visit (INDEPENDENT_AMBULATORY_CARE_PROVIDER_SITE_OTHER): Payer: Medicare HMO | Admitting: Family Medicine

## 2017-02-08 ENCOUNTER — Encounter: Payer: Self-pay | Admitting: Family Medicine

## 2017-02-08 VITALS — BP 155/78 | HR 69 | Temp 98.4°F | Ht 63.0 in | Wt 175.4 lb

## 2017-02-08 DIAGNOSIS — N189 Chronic kidney disease, unspecified: Secondary | ICD-10-CM | POA: Diagnosis not present

## 2017-02-08 DIAGNOSIS — E039 Hypothyroidism, unspecified: Secondary | ICD-10-CM | POA: Diagnosis not present

## 2017-02-08 DIAGNOSIS — Z23 Encounter for immunization: Secondary | ICD-10-CM

## 2017-02-08 DIAGNOSIS — E782 Mixed hyperlipidemia: Secondary | ICD-10-CM | POA: Diagnosis not present

## 2017-02-08 DIAGNOSIS — M810 Age-related osteoporosis without current pathological fracture: Secondary | ICD-10-CM | POA: Diagnosis not present

## 2017-02-08 DIAGNOSIS — E2839 Other primary ovarian failure: Secondary | ICD-10-CM | POA: Diagnosis not present

## 2017-02-08 DIAGNOSIS — N289 Disorder of kidney and ureter, unspecified: Secondary | ICD-10-CM

## 2017-02-08 DIAGNOSIS — I10 Essential (primary) hypertension: Secondary | ICD-10-CM | POA: Diagnosis not present

## 2017-02-08 DIAGNOSIS — Z2911 Encounter for prophylactic immunotherapy for respiratory syncytial virus (RSV): Secondary | ICD-10-CM | POA: Diagnosis not present

## 2017-02-08 DIAGNOSIS — I701 Atherosclerosis of renal artery: Secondary | ICD-10-CM | POA: Diagnosis not present

## 2017-02-08 DIAGNOSIS — R739 Hyperglycemia, unspecified: Secondary | ICD-10-CM | POA: Diagnosis not present

## 2017-02-08 LAB — LIPID PANEL
CHOL/HDL RATIO: 4
Cholesterol: 307 mg/dL — ABNORMAL HIGH (ref 0–200)
HDL: 85.1 mg/dL (ref >39.00)
LDL Cholesterol: 207 mg/dL — ABNORMAL HIGH (ref 0–99)
NONHDL: 221.91
TRIGLYCERIDES: 75 mg/dL (ref 0.0–149.0)
VLDL: 15 mg/dL (ref 0.0–40.0)

## 2017-02-08 LAB — COMPREHENSIVE METABOLIC PANEL
ALT: 7 U/L (ref 0–35)
AST: 13 U/L (ref 0–37)
Albumin: 4.2 g/dL (ref 3.5–5.2)
Alkaline Phosphatase: 74 U/L (ref 39–117)
BILIRUBIN TOTAL: 0.4 mg/dL (ref 0.2–1.2)
BUN: 19 mg/dL (ref 6–23)
CALCIUM: 9.7 mg/dL (ref 8.4–10.5)
CHLORIDE: 105 meq/L (ref 96–112)
CO2: 25 mEq/L (ref 19–32)
Creatinine, Ser: 1.6 mg/dL — ABNORMAL HIGH (ref 0.40–1.20)
GFR: 40.05 mL/min — AB (ref >60.00)
GLUCOSE: 92 mg/dL (ref 70–99)
Potassium: 3.4 mEq/L — ABNORMAL LOW (ref 3.5–5.1)
Sodium: 139 mEq/L (ref 135–145)
Total Protein: 8.1 g/dL (ref 6.0–8.3)

## 2017-02-08 LAB — HEMOGLOBIN A1C: Hgb A1c MFr Bld: 5.4 % (ref 4.6–6.5)

## 2017-02-08 LAB — TSH: TSH: 97.91 u[IU]/mL — ABNORMAL HIGH (ref 0.35–4.50)

## 2017-02-08 NOTE — Patient Instructions (Signed)
It was a pleasure to see you today- take care and I will be in touch with your labs We will make sure that your sodium level is normal- it if ok to drink water if you feel thirsty but do not force yourself to drink water!  Assuming your labs are ok we can plan to recheck in 4-6 months

## 2017-02-08 NOTE — Progress Notes (Addendum)
[facility] [address], [address] [place], [place] [address] [phone]  Date:  02/08/2017   Name:  Carolyn Myers   DOB:  1936/08/24   MRN:  [id number]  PCP:  Piedad Climes, PA-C    Chief Complaint: Follow-up (Pt here for 2 month follow with recheck and labs. )   History of Present Illness:  Carolyn Myers is a 81 y.o. very pleasant female patient who presents with the following:  Former pt of [facility]. Here today for a recheck- last seen by myself in December, BP high at that visit on 3 meds Asked her to contact me with some home readings-  email from 2/5 with numbers [phone]/ [phone] Given her low DBP readings did not attempt to increase medications to drop SBP  Reviewed most recent cardiology note from November- she had a low risk stress in 3/17, she does have known history of bilateral renal artery stenosis. He noted control of BP with current diet and medications, she does have some CP which is likely GI related.  They stopped her Imdur and no change in her sx. We are monitoring her lipids and renal function  She has been feeling well overall- she is generally sleeping well She is not having any sx of GERD No dizziness, no feeling faint Her appetite is not great- however this is baseline for her No diarrhea or vomiting She is FASTING today for labs Wt Readings from Last 3 Encounters:  02/08/17 175 lb 6.4 oz (79.6 kg)  12/03/16 170 lb 9.6 oz (77.4 kg)  11/05/16 173 lb 12.8 oz (78.8 kg)   She is due for a bone density scan-  I will arrange this for her She is not aware of having had a shingles vaccine and would like to have this today as well She notes that she tends to drink a lot of water- wonders if this is ok to do. Will monitor her lyte sfo rher today  Lab Results  Component Value Date   CHOL 200 02/22/2016   CHOL 276 (H) 11/23/2013   Lab Results  Component Value Date   HDL 58 02/22/2016   HDL  68 11/23/2013   Lab Results  Component Value Date   LDLCALC 127 (H) 02/22/2016   LDLCALC 191 (H) 11/23/2013   Lab Results  Component Value Date   TRIG 77 02/22/2016   TRIG 87 11/23/2013   Lab Results  Component Value Date   CHOLHDL 3.4 02/22/2016   CHOLHDL 4.1 11/23/2013   No results found for: LDLDIRECT Due for lipids today    Chemistry      Component Value Date/Time   NA 137 07/01/2016 1410   K 3.6 07/01/2016 1410   CL 107 07/01/2016 1410   CO2 22 07/01/2016 1410   BUN 22 (H) 07/01/2016 1410   CREATININE 1.46 (H) 07/01/2016 1410   CREATININE 1.33 (H) 03/13/2016 1149      Component Value Date/Time   CALCIUM 9.6 07/01/2016 1410   ALKPHOS 95 07/01/2016 1410   AST 19 07/01/2016 1410   ALT 15 07/01/2016 1410   BILITOT 0.7 07/01/2016 1410     Lab Results  Component Value Date   TSH 1.29 08/14/2016     Patient Active Problem List   Diagnosis Date Noted  . Esophageal reflux 05/08/2016  . Renal artery stenosis (HCC) 02/22/2016  . Hypothyroidism 02/22/2016  . Chest pain with high risk for  cardiac etiology 02/21/2016  . Gout   . SOB (shortness of breath) on exertion 10/06/2015  . Thyroid activity decreased 10/06/2015  . Accelerated hypertension 10/06/2015  . Essential hypertension, benign 11/26/2013  . Hyperlipidemia LDL goal <100 11/26/2013    Past Medical History:  Diagnosis Date  . Arthritis    "minor in my shoulders" (02/21/2016)  . Chicken pox   . Diverticulitis   . Frequent headaches    "maybe twice/week" (03/12/2016)  . Gout    Right Foot  . History of blood transfusion    "said my blood was low"  . History of gout   . Hyperlipidemia   . Hypertension   . Hypothyroidism   . Migraine    "maybe twice/year" (02/21/2016)  . Palpitations    "doctor thought it was related to my thyroid"  . Sleep apnea    "I don't wear my mask" (02/21/2016)    Past Surgical History:  Procedure Laterality Date  . ABDOMINAL HYSTERECTOMY     "for fibroid tumors"  .  TUBAL LIGATION      Social History  Substance Use Topics  . Smoking status: Never Smoker  . Smokeless tobacco: Never Used  . Alcohol use No    Family History  Problem Relation Age of Onset  . Heart disease Mother   . Heart attack Mother     died from it  . Hypertension Mother   . Arthritis Mother   . Stroke Father   . Cancer Maternal Grandmother   . Heart attack Maternal Aunt   . Renal Disease Maternal Aunt   . Multiple myeloma Maternal Uncle   . Emphysema Maternal Uncle   . Heart disease Sister   . Thyroid disease Sister   . Healthy Son     x3  . Healthy Daughter     x5    Allergies  Allergen Reactions  . Pineapple Swelling    Reaction to fresh pineapple - tongue swelling    Medication list has been reviewed and updated.  Current Outpatient Prescriptions on File Prior to Visit  Medication Sig Dispense Refill  . acetaminophen (TYLENOL) 325 MG tablet Take 650 mg by mouth every 6 (six) hours as needed (pain).     Marland Kitchen allopurinol (ZYLOPRIM) 100 MG tablet TAKE 2 TABLETS EVERY DAY 180 tablet 3  . amLODipine (NORVASC) 10 MG tablet Take 1 tablet (10 mg total) by mouth daily. 90 tablet 3  . aspirin EC 81 MG EC tablet Take 1 tablet (81 mg total) by mouth daily.    Marland Kitchen atenolol (TENORMIN) 25 MG tablet TAKE 1 TABLET EVERY DAY AT 12 NOON 90 tablet 3  . hydrALAZINE (APRESOLINE) 50 MG tablet Take 1 tablet (50 mg total) by mouth 3 (three) times daily. 90 tablet 11  . levothyroxine (SYNTHROID, LEVOTHROID) 200 MCG tablet TAKE 1 TABLET BY MOUTH EVERY DAY BEFORE BREAKFAST 30 tablet 1  . lovastatin (MEVACOR) 20 MG tablet Take 1 tablet (20 mg total) by mouth at bedtime. 90 tablet 1  . meclizine (ANTIVERT) 25 MG tablet Take 25 mg by mouth 3 (three) times daily as needed for dizziness. Reported on 03/20/2016    . nitroGLYCERIN (NITROSTAT) 0.4 MG SL tablet Place 1 tablet (0.4 mg total) under the tongue every 5 (five) minutes x 3 doses as needed for chest pain. 25 tablet 5  . OVER THE COUNTER  MEDICATION Place 1 drop into both eyes daily as needed (dry eyes). Over the counter lubricating eye drops    .  ranitidine (ZANTAC) 300 MG tablet TAKE 0.5 TABLETS BY MOUTH 2 TIMES DAILY. 90 tablet 1   No current facility-administered medications on file prior to visit.     Review of Systems:  As per HPI- otherwise negative.   Physical Examination: Vitals:   02/08/17 0918 02/08/17 0924  BP: (!) 168/82 (!) 155/78  Pulse: 69   Temp: 98.4 F (36.9 C)    Vitals:   02/08/17 0918  Weight: 175 lb 6.4 oz (79.6 kg)  Height: $Remove'5\' 3"'zbIupaM$  (1.6 m)   Body mass index is 31.07 kg/m. Ideal Body Weight: Weight in (lb) to have BMI = 25: 140.8  GEN: WDWN, NAD, Non-toxic, A & O x 3, mild overweight, appears younger than age.  Looks well HEENT: Atraumatic, Normocephalic. Neck supple. No masses, No LAD.  Bilateral TM wnl, oropharynx normal.  PEERL,EOMI.   Ears and Nose: No external deformity. CV: RRR, No M/G/R. No JVD. No thrill. No extra heart sounds. PULM: CTA B, no wheezes, crackles, rhonchi. No retractions. No resp. distress. No accessory muscle use. EXTR: No c/c/e NEURO Normal gait.  PSYCH: Normally interactive. Conversant. Not depressed or anxious appearing.  Calm demeanor.    Assessment and Plan: Essential hypertension, benign - Plan: Comprehensive metabolic panel  Renal artery stenosis ([place]) - Plan: Comprehensive metabolic panel  Mixed hyperlipidemia - Plan: Lipid panel  Hyperglycemia - Plan: Hemoglobin A1c  Acquired hypothyroidism - Plan: TSH  Immunization due - Plan: Varicella-zoster vaccine subcutaneous  Estrogen deficiency - Plan: DG Bone Density   Here today for a recheck Will monitor her electrolytes and renal function today with a CMP Also due for lipids Noted some mild hyperglycemia- get A1c today History of hypothyroidism- check TSH zostavax today Ordered dexa scan  Signed Lamar Blinks, MD   Called pt- she has been taking her synthroid regularly.  Will repeat  her TSH for her as a lab visit only next week as result is very unexpected  Results for orders placed or performed in visit on 02/08/17  Comprehensive metabolic panel  Result Value Ref Range   Sodium 139 135 - 145 mEq/L   Potassium 3.4 (L) 3.5 - 5.1 mEq/L   Chloride 105 96 - 112 mEq/L   CO2 25 19 - 32 mEq/L   Glucose, Bld 92 70 - 99 mg/dL   BUN 19 6 - 23 mg/dL   Creatinine, Ser 1.60 (H) 0.40 - 1.20 mg/dL   Total Bilirubin 0.4 0.2 - 1.2 mg/dL   Alkaline Phosphatase 74 39 - 117 U/L   AST 13 0 - 37 U/L   ALT 7 0 - 35 U/L   Total Protein 8.1 6.0 - 8.3 g/dL   Albumin 4.2 3.5 - 5.2 g/dL   Calcium 9.7 8.4 - 10.5 mg/dL   GFR 40.05 (L) >60.00 mL/min  Lipid panel  Result Value Ref Range   Cholesterol 307 (H) 0 - 200 mg/dL   Triglycerides 75.0 0.0 - 149.0 mg/dL   HDL 85.10 >39.00 mg/dL   VLDL 15.0 0.0 - 40.0 mg/dL   LDL Cholesterol 207 (H) 0 - 99 mg/dL   Total CHOL/HDL Ratio 4    NonHDL 221.91   TSH  Result Value Ref Range   TSH 97.91 Repeated and verified X2. (H) 0.35 - 4.50 uIU/mL  Hemoglobin A1c  Result Value Ref Range   Hgb A1c MFr Bld 5.4 4.6 - 6.5 %   Received repeat labs 3/6- her TSH is now normal at 0.46, T3 is normal.  Free T4 is  surprisingly slightly high.    Bone density results are also in-  ASSESSMENT: The BMD measured at Femur Total Left is 0.690 g/cm2 with a T-score of -2.5. This patient is considered osteoporotic according to [facility]) criteria. Consistent with osteoporosis.    Gave her a call to discuss No sign of DM Her CRI is a bit worse than normal- will plan to repeat in about 2 months Her TSH is normal- would have her continue current dose of thyroid med and reepat this as well in 2 months She would like to start treatment for osteoporosis - however her creat clearance is under 30 so oral treatment is problematic.  She has never seen nephrology as far as she can recall; will make referral for her  Also noted her CHL is quite high and  she is only on 20 mg of lovastatin.  However with her creat clearance she may not be able to tolerate a higher dose.    Will send a letter to pt as we went over a lot of information

## 2017-02-08 NOTE — Progress Notes (Signed)
Pre visit review using our clinic review tool, if applicable. No additional management support is needed unless otherwise documented below in the visit note. 

## 2017-02-09 ENCOUNTER — Other Ambulatory Visit: Payer: Self-pay | Admitting: Family Medicine

## 2017-02-09 DIAGNOSIS — E039 Hypothyroidism, unspecified: Secondary | ICD-10-CM

## 2017-02-10 NOTE — Addendum Note (Signed)
Addended by: Lamar Blinks C on: 02/10/2017 10:28 PM   Modules accepted: Orders

## 2017-02-19 ENCOUNTER — Encounter: Payer: Self-pay | Admitting: Family Medicine

## 2017-02-22 ENCOUNTER — Other Ambulatory Visit (INDEPENDENT_AMBULATORY_CARE_PROVIDER_SITE_OTHER): Payer: Medicare HMO

## 2017-02-22 DIAGNOSIS — E039 Hypothyroidism, unspecified: Secondary | ICD-10-CM

## 2017-02-23 ENCOUNTER — Ambulatory Visit (HOSPITAL_BASED_OUTPATIENT_CLINIC_OR_DEPARTMENT_OTHER)
Admission: RE | Admit: 2017-02-23 | Discharge: 2017-02-23 | Disposition: A | Discharge status: Home / Self Care | Payer: Medicare HMO | Source: Ambulatory Visit | Attending: Family Medicine | Admitting: Family Medicine

## 2017-02-23 DIAGNOSIS — M818 Other osteoporosis without current pathological fracture: Secondary | ICD-10-CM | POA: Insufficient documentation

## 2017-02-23 DIAGNOSIS — M810 Age-related osteoporosis without current pathological fracture: Secondary | ICD-10-CM | POA: Insufficient documentation

## 2017-02-23 DIAGNOSIS — N183 Chronic kidney disease, stage 3 unspecified: Secondary | ICD-10-CM | POA: Insufficient documentation

## 2017-02-23 DIAGNOSIS — Z780 Asymptomatic menopausal state: Secondary | ICD-10-CM | POA: Diagnosis not present

## 2017-02-23 DIAGNOSIS — E2839 Other primary ovarian failure: Secondary | ICD-10-CM | POA: Insufficient documentation

## 2017-02-23 LAB — T4, FREE: Free T4: 1.7 ng/dL — ABNORMAL HIGH (ref 0.60–1.60)

## 2017-02-23 LAB — T3, FREE: T3, Free: 3.2 pg/mL (ref 2.3–4.2)

## 2017-02-23 LAB — TSH: TSH: 0.46 u[IU]/mL (ref 0.35–4.50)

## 2017-02-23 NOTE — Addendum Note (Signed)
Addended by: Lamar Blinks C on: 02/23/2017 07:30 PM   Modules accepted: Orders

## 2017-02-24 ENCOUNTER — Other Ambulatory Visit: Payer: Self-pay

## 2017-02-24 MED ORDER — HYDRALAZINE HCL 50 MG PO TABS: 50.0000 mg | ORAL_TABLET | Freq: Three times a day (TID) | ORAL | 2 refills | Status: DC

## 2017-03-08 ENCOUNTER — Other Ambulatory Visit: Payer: Self-pay | Admitting: Physician Assistant

## 2017-03-08 DIAGNOSIS — K219 Gastro-esophageal reflux disease without esophagitis: Secondary | ICD-10-CM

## 2017-03-25 ENCOUNTER — Other Ambulatory Visit: Payer: Self-pay | Admitting: Emergency Medicine

## 2017-03-25 DIAGNOSIS — K219 Gastro-esophageal reflux disease without esophagitis: Secondary | ICD-10-CM

## 2017-03-25 MED ORDER — ATENOLOL 25 MG PO TABS: ORAL_TABLET | ORAL | 3 refills | Status: DC

## 2017-03-25 MED ORDER — AMLODIPINE BESYLATE 10 MG PO TABS: 10.0000 mg | ORAL_TABLET | Freq: Every day | ORAL | 3 refills | Status: DC

## 2017-03-25 MED ORDER — HYDRALAZINE HCL 50 MG PO TABS: 50.0000 mg | ORAL_TABLET | Freq: Three times a day (TID) | ORAL | 2 refills | Status: DC

## 2017-03-25 MED ORDER — ALLOPURINOL 100 MG PO TABS: 200.0000 mg | ORAL_TABLET | Freq: Every day | ORAL | 3 refills | Status: DC

## 2017-03-25 MED ORDER — LEVOTHYROXINE SODIUM 200 MCG PO TABS: ORAL_TABLET | ORAL | 2 refills | Status: DC

## 2017-03-25 MED ORDER — RANITIDINE HCL 300 MG PO TABS: 150.0000 mg | ORAL_TABLET | Freq: Two times a day (BID) | ORAL | 2 refills | Status: DC

## 2017-03-25 MED ORDER — LOVASTATIN 20 MG PO TABS: 20.0000 mg | ORAL_TABLET | Freq: Every day | ORAL | 2 refills | Status: DC

## 2017-04-07 DIAGNOSIS — N183 Chronic kidney disease, stage 3 (moderate): Secondary | ICD-10-CM | POA: Diagnosis not present

## 2017-04-07 DIAGNOSIS — I701 Atherosclerosis of renal artery: Secondary | ICD-10-CM | POA: Diagnosis not present

## 2017-04-07 DIAGNOSIS — Z6831 Body mass index (BMI) 31.0-31.9, adult: Secondary | ICD-10-CM | POA: Diagnosis not present

## 2017-04-07 DIAGNOSIS — I129 Hypertensive chronic kidney disease with stage 1 through stage 4 chronic kidney disease, or unspecified chronic kidney disease: Secondary | ICD-10-CM | POA: Diagnosis not present

## 2017-04-12 ENCOUNTER — Encounter: Payer: Self-pay | Admitting: Family Medicine

## 2017-04-28 ENCOUNTER — Telehealth: Payer: Self-pay | Admitting: Family Medicine

## 2017-04-28 NOTE — Telephone Encounter (Signed)
Called patient to schedule awv. Patient did not answer. Patient's DPR requests  that no messages be left on home or cell phone.

## 2017-06-14 ENCOUNTER — Ambulatory Visit (INDEPENDENT_AMBULATORY_CARE_PROVIDER_SITE_OTHER): Payer: Medicare HMO | Admitting: Family Medicine

## 2017-06-14 VITALS — BP 142/70 | HR 60 | Temp 98.3°F | Ht 63.0 in | Wt 175.8 lb

## 2017-06-14 DIAGNOSIS — Z5181 Encounter for therapeutic drug level monitoring: Secondary | ICD-10-CM | POA: Diagnosis not present

## 2017-06-14 DIAGNOSIS — F5101 Primary insomnia: Secondary | ICD-10-CM | POA: Diagnosis not present

## 2017-06-14 DIAGNOSIS — I701 Atherosclerosis of renal artery: Secondary | ICD-10-CM

## 2017-06-14 DIAGNOSIS — E039 Hypothyroidism, unspecified: Secondary | ICD-10-CM

## 2017-06-14 DIAGNOSIS — I10 Essential (primary) hypertension: Secondary | ICD-10-CM

## 2017-06-14 MED ORDER — TRAZODONE HCL 50 MG PO TABS: 25.0000 mg | ORAL_TABLET | Freq: Every evening | ORAL | 3 refills | Status: DC | PRN

## 2017-06-14 NOTE — Patient Instructions (Addendum)
It was very nice to see you today!  I will be in touch with your labs asap You can stop the allopurinol- if your gout comes back that is fine, we can go back on the medication  For sleep, try taking a 1/2 tablet of trazodone at bedtime. You can increase to a full tablet if needed  Take care and let me know if any concerns  Let's plan to visit in 4-6 months +

## 2017-06-14 NOTE — Progress Notes (Signed)
Pre visit review using our clinic tool,if applicable. No additional management support is needed unless otherwise documented below in the visit note.  

## 2017-06-14 NOTE — Progress Notes (Addendum)
[facility] at [facility] [address], [place], [place] [address] [phone]  Date:  06/14/2017   Name:  Carolyn Myers   DOB:  1936/07/03   MRN:  [id number]  PCP:  Darreld Mclean, MD    Chief Complaint: Follow-up (Has some SOB when walking)   History of Present Illness:  Carolyn Myers is a 81 y.o. very pleasant female patient who presents with the following:  Last seen by myself back in February of this year. Here for a recheck today.   Noted SOB as CC today- however [place] states that she has had the SOB listed for a long time. This is not new or different.  She notes that with any significant walking she will get SOB. However this is stable is not better recently. She has not had any chest pain  She is not having any swelling in her feet or legs  No orthopnea- sleeps on 1 or 2 pillows at night  She is on allopurinol for gout prophylaxis- however no gout in the last year or so. She would like to try stopping her alloprinol which is fine   She did see her nephrologist, Dr. Florene Glen recently for her renal disease due to renal artery stenosis and HTN.  She is stable although they are considering changing her BP meds    She is on medication for her thyroid - she has ben on 200 mcg for some time.  Needs a recheck today  She does have OSA but is not using a mask- she could not afford one   Also has complaint that she cannot settle down and get to sleep at night.  This is longstanding.  She wonders if she might try a medication for this  BP Readings from Last 3 Encounters:  06/14/17 (!) 170/57  02/08/17 (!) 155/78  12/03/16 (!) 160/80   Pulse Readings from Last 3 Encounters:  06/14/17 (!) 50  02/08/17 69  12/03/16 78   Wt Readings from Last 3 Encounters:  06/14/17 175 lb 12.8 oz (79.7 kg)  02/08/17 175 lb 6.4 oz (79.6 kg)  12/03/16 170 lb 9.6 oz (77.4 kg)     Patient Active Problem List   Diagnosis Date Noted   . CKD (chronic kidney disease) stage 3, GFR 30-59 ml/min 02/23/2017  . Age-related osteoporosis without current pathological fracture 02/23/2017  . Esophageal reflux 05/08/2016  . Renal artery stenosis ([place]) 02/22/2016  . Hypothyroidism 02/22/2016  . Chest pain with high risk for cardiac etiology 02/21/2016  . Gout   . SOB (shortness of breath) on exertion 10/06/2015  . Thyroid activity decreased 10/06/2015  . Accelerated hypertension 10/06/2015  . Essential hypertension, benign 11/26/2013  . Hyperlipidemia LDL goal <100 11/26/2013    Past Medical History:  Diagnosis Date  . Arthritis    "minor in my shoulders" (02/21/2016)  . Chicken pox   . Diverticulitis   . Frequent headaches    "maybe twice/week" (03/12/2016)  . Gout    Right Foot  . History of blood transfusion    "said my blood was low"  . History of gout   . Hyperlipidemia   . Hypertension   . Hypothyroidism   . Migraine    "maybe twice/year" (02/21/2016)  . Palpitations    "doctor thought it was related to my thyroid"  . Sleep apnea    "I don't wear my mask" (02/21/2016)    Past Surgical History:  Procedure Laterality Date  . ABDOMINAL HYSTERECTOMY     "for fibroid tumors"  . TUBAL LIGATION      Social History  Substance Use Topics  . Smoking status: Never Smoker  . Smokeless tobacco: Never Used  . Alcohol use No    Family History  Problem Relation Age of Onset  . Heart disease Mother   . Heart attack Mother        died from it  . Hypertension Mother   . Arthritis Mother   . Stroke Father   . Cancer Maternal Grandmother   . Heart attack Maternal Aunt   . Renal Disease Maternal Aunt   . Multiple myeloma Maternal Uncle   . Emphysema Maternal Uncle   . Heart disease Sister   . Thyroid disease Sister   . Healthy Son        x3  . Healthy Daughter        x5    Allergies  Allergen Reactions  . Pineapple Swelling    Reaction to fresh pineapple - tongue swelling    Medication list has been  reviewed and updated.  Current Outpatient Prescriptions on File Prior to Visit  Medication Sig Dispense Refill  . acetaminophen (TYLENOL) 325 MG tablet Take 650 mg by mouth every 6 (six) hours as needed (pain).     Marland Kitchen allopurinol (ZYLOPRIM) 100 MG tablet Take 2 tablets (200 mg total) by mouth daily. 180 tablet 3  . amLODipine (NORVASC) 10 MG tablet Take 1 tablet (10 mg total) by mouth daily. 90 tablet 3  . aspirin EC 81 MG EC tablet Take 1 tablet (81 mg total) by mouth daily.    Marland Kitchen atenolol (TENORMIN) 25 MG tablet TAKE 1 TABLET EVERY DAY AT 12 NOON 90 tablet 3  . hydrALAZINE (APRESOLINE) 50 MG tablet Take 1 tablet (50 mg total) by mouth 3 (three) times daily. 90 tablet 2  . levothyroxine (SYNTHROID, LEVOTHROID) 200 MCG tablet TAKE 1 TABLET EVERY DAY BEFORE BREAKFAST 90 tablet 2  . lovastatin (MEVACOR) 20 MG tablet Take 1 tablet (20 mg total) by mouth at bedtime. 90 tablet 2  . meclizine (ANTIVERT) 25 MG tablet Take 25 mg by mouth 3 (three) times daily as needed for dizziness. Reported on 03/20/2016    . nitroGLYCERIN (NITROSTAT) 0.4 MG SL tablet Place 1 tablet (0.4 mg total) under the tongue every 5 (five) minutes x 3 doses as needed for chest pain. 25 tablet 5  . OVER THE COUNTER MEDICATION Place 1 drop into both eyes daily as needed (dry eyes). Over the counter lubricating eye drops    . ranitidine (ZANTAC) 300 MG tablet Take 0.5 tablets (150 mg total) by mouth 2 (two) times daily. 90 tablet 2   No current facility-administered medications on file prior to visit.     Review of Systems:  As per HPI- otherwise negative.   Physical Examination: Vitals:   06/14/17 1539 06/14/17 1542  BP: (!) 182/57 (!) 170/57  Pulse: (!) 50   Temp: 98.3 F (36.8 C)    Vitals:   06/14/17 1539  Weight: 175 lb 12.8 oz (79.7 kg)  Height: 5' 3" (1.6 m)   Body mass index is 31.14 kg/m. Ideal Body Weight: Weight in (lb) to have BMI = 25: 140.8  GEN: WDWN, NAD, Non-toxic, A & O x 3 HEENT: Atraumatic,  Normocephalic. Neck supple. No masses, No LAD. Ears and Nose: No external deformity. CV: RRR, No M/G/R. No JVD. No thrill. No extra heart  sounds. PULM: CTA B, no wheezes, crackles, rhonchi. No retractions. No resp. distress. No accessory muscle use. ABD: S, NT, ND, +BS. No rebound. No HSM. EXTR: No c/c/e NEURO slow and careful gait c/w age PSYCH: Normally interactive. Conversant. Not depressed or anxious appearing.  Calm demeanor.    Assessment and Plan: Essential hypertension, benign - Plan: Basic metabolic panel  Renal artery stenosis (HCC)  Acquired hypothyroidism - Plan: TSH, T4  Medication monitoring encounter - Plan: Basic metabolic panel, CBC  Primary insomnia - Plan: traZODone (DESYREL) 50 MG tablet  Here today for a recheck visit She has difficult to control BP which is also followed by nephrology BP control is reasonable by final recheck today Will repeat her thyroid labs today She would like to try medication for insomnia- will start on a low dose of trazodone for her, 25 mg, increase to 50 if needed  Will plan further follow- up pending labs.    Signed Lamar Blinks, MD  Received labs as below  Results for orders placed or performed in visit on 06/14/17  TSH  Result Value Ref Range   TSH 12.85 (H) 0.35 - 4.50 uIU/mL  T4  Result Value Ref Range   T4, Total 11.1 4.5 - 12.0 ug/dL  Basic metabolic panel  Result Value Ref Range   Sodium 139 135 - 145 mEq/L   Potassium 4.0 3.5 - 5.1 mEq/L   Chloride 108 96 - 112 mEq/L   CO2 25 19 - 32 mEq/L   Glucose, Bld 89 70 - 99 mg/dL   BUN 33 (H) 6 - 23 mg/dL   Creatinine, Ser 1.76 (H) 0.40 - 1.20 mg/dL   Calcium 9.3 8.4 - 10.5 mg/dL   GFR 35.84 (L) >60.00 mL/min  CBC  Result Value Ref Range   WBC 7.8 4.0 - 10.5 K/uL   RBC 3.96 3.87 - 5.11 Mil/uL   Platelets 346.0 150.0 - 400.0 K/uL   Hemoglobin 10.7 (L) 12.0 - 15.0 g/dL   HCT 32.8 (L) 36.0 - 46.0 %   MCV 82.9 78.0 - 100.0 fl   MCHC 32.5 30.0 - 36.0 g/dL    RDW 15.2 11.5 - 15.5 %   Will send copy to Dr. Florene Glen who is monitoring her renal function Mild anemia- ?due to CRI.  Pt reports a negative colonoscopy 3 years ago Her TSH has been up and down over the last year- T4 is normal,  Will not increase her thyroid med at this time.  She is seeing me in December for a recheck

## 2017-06-15 LAB — CBC
HCT: 32.8 % — ABNORMAL LOW (ref 36.0–46.0)
HEMOGLOBIN: 10.7 g/dL — AB (ref 12.0–15.0)
MCHC: 32.5 g/dL (ref 30.0–36.0)
MCV: 82.9 fl (ref 78.0–100.0)
PLATELETS: 346 10*3/uL (ref 150.0–400.0)
RBC: 3.96 Mil/uL (ref 3.87–5.11)
RDW: 15.2 % (ref 11.5–15.5)
WBC: 7.8 10*3/uL (ref 4.0–10.5)

## 2017-06-15 LAB — BASIC METABOLIC PANEL
BUN: 33 mg/dL — ABNORMAL HIGH (ref 6–23)
CHLORIDE: 108 meq/L (ref 96–112)
CO2: 25 mEq/L (ref 19–32)
Calcium: 9.3 mg/dL (ref 8.4–10.5)
Creatinine, Ser: 1.76 mg/dL — ABNORMAL HIGH (ref 0.40–1.20)
GFR: 35.84 mL/min — ABNORMAL LOW (ref >60.00)
GLUCOSE: 89 mg/dL (ref 70–99)
POTASSIUM: 4 meq/L (ref 3.5–5.1)
SODIUM: 139 meq/L (ref 135–145)

## 2017-06-15 LAB — TSH: TSH: 12.85 u[IU]/mL — ABNORMAL HIGH (ref 0.35–4.50)

## 2017-06-15 LAB — T4: T4 TOTAL: 11.1 ug/dL (ref 4.5–12.0)

## 2017-06-16 ENCOUNTER — Other Ambulatory Visit: Payer: Self-pay | Admitting: Emergency Medicine

## 2017-06-16 MED ORDER — HYDRALAZINE HCL 50 MG PO TABS: 50.0000 mg | ORAL_TABLET | Freq: Three times a day (TID) | ORAL | 2 refills | Status: DC

## 2017-06-21 ENCOUNTER — Encounter: Payer: Self-pay | Admitting: Family Medicine

## 2017-07-12 DIAGNOSIS — Z6831 Body mass index (BMI) 31.0-31.9, adult: Secondary | ICD-10-CM | POA: Diagnosis not present

## 2017-07-12 DIAGNOSIS — I129 Hypertensive chronic kidney disease with stage 1 through stage 4 chronic kidney disease, or unspecified chronic kidney disease: Secondary | ICD-10-CM | POA: Diagnosis not present

## 2017-07-12 DIAGNOSIS — N183 Chronic kidney disease, stage 3 (moderate): Secondary | ICD-10-CM | POA: Diagnosis not present

## 2017-07-12 DIAGNOSIS — I701 Atherosclerosis of renal artery: Secondary | ICD-10-CM | POA: Diagnosis not present

## 2017-09-14 ENCOUNTER — Other Ambulatory Visit: Payer: Self-pay | Admitting: Family Medicine

## 2017-09-16 ENCOUNTER — Other Ambulatory Visit: Payer: Self-pay | Admitting: Emergency Medicine

## 2017-11-11 ENCOUNTER — Other Ambulatory Visit: Payer: Self-pay | Admitting: Family Medicine

## 2017-11-30 NOTE — Progress Notes (Addendum)
[place] at [facility] [address] [place], [place] [address] [phone]  Date:  12/02/2017   Name:  Carolyn Myers   DOB:  [birth year]   MRN:  [id number]  PCP:  Darreld Mclean, MD    Chief Complaint: Follow-up (Pt here for f/u visit. )   History of Present Illness:  Carolyn Myers is a 81 y.o. very pleasant female patient who presents with the following:  Follow-up visit today- last seen here in June: Last seen by myself back in February of this year. Here for a recheck today.   Noted SOB as CC today- however Celine states that she has had the SOB listed for a long time. This is not new or different.  She notes that with any significant walking she will get SOB. However this is stable is not better recently. She has not had any chest pain  She is not having any swelling in her feet or legs  No orthopnea- sleeps on 1 or 2 pillows at night She is on allopurinol for gout prophylaxis- however no gout in the last year or so. She would like to try stopping her alloprinol which is fine  She did see her nephrologist, Dr. Florene Glen recently for her renal disease due to renal artery stenosis and HTN.  She is stable although they are considering changing her BP meds   She is on medication for her thyroid - she has ben on 200 mcg for some time.  Needs a recheck today She does have OSA but is not using a mask- she could not afford one  Also has complaint that she cannot settle down and get to sleep at night.  This is longstanding.  She wonders if she might try a medication for this   At her last visit I rx trazodone for sleep- however she never ended up filling this Needs to recheck her TSH today  Flu:  Done already Tetanus: she thinks "years ago"  But she does not have any wound on her body recently so decided against booster today as medicare will not pay  Pt notes that she is having "more good days than bad days" at this  time and is feeling pretty well She notes that most of the time she is sleeping pretty well, but occasionally does have trouble. She would like to try the trazodone at least on a prn basis  She was seen by Daneen Schick, her cardiologist, about a year ago-  She does have renal artery stenosis which likely contributes to her HTN.  Had a low risk stress in 2017  She is on hydralazine 50 TID, atenolol 20 daily, amlodipine 10 daily Her renal function is reduced but has been fairly stable over the last couple of years  Need to repeat her TSH today  Noted that pts pulse is quite slow today She has not noted any sx of such- no palpitations, faintness, lightheadedness or syncope, no CP She does have SOB but this is longstanding and improved at this time   BP Readings from Last 3 Encounters:  12/02/17 (!) 160/70  06/14/17 (!) 142/70  02/08/17 (!) 155/78   Lab Results  Component Value Date   TSH 12.85 (H) 06/14/2017   Pulse Readings from Last 3 Encounters:  12/02/17 (!) 45  06/14/17 60  02/08/17 69    Patient Active Problem List   Diagnosis Date Noted  . CKD (chronic kidney disease)  stage 3, GFR 30-59 ml/min (HCC) 02/23/2017  . Age-related osteoporosis without current pathological fracture 02/23/2017  . Esophageal reflux 05/08/2016  . Renal artery stenosis ([place]) 02/22/2016  . Hypothyroidism 02/22/2016  . Chest pain with high risk for cardiac etiology 02/21/2016  . Gout   . SOB (shortness of breath) on exertion 10/06/2015  . Accelerated hypertension 10/06/2015  . Essential hypertension, benign 11/26/2013  . Hyperlipidemia LDL goal <100 11/26/2013    Past Medical History:  Diagnosis Date  . Arthritis    "minor in my shoulders" (02/21/2016)  . Chicken pox   . Diverticulitis   . Frequent headaches    "maybe twice/week" (03/12/2016)  . Gout    Right Foot  . History of blood transfusion    "said my blood was low"  . History of gout   . Hyperlipidemia   . Hypertension   .  Hypothyroidism   . Migraine    "maybe twice/year" (02/21/2016)  . Palpitations    "doctor thought it was related to my thyroid"  . Sleep apnea    "I don't wear my mask" (02/21/2016)    Past Surgical History:  Procedure Laterality Date  . ABDOMINAL HYSTERECTOMY     "for fibroid tumors"  . TUBAL LIGATION      Social History   Tobacco Use  . Smoking status: Never Smoker  . Smokeless tobacco: Never Used  Substance Use Topics  . Alcohol use: No  . Drug use: No    Family History  Problem Relation Age of Onset  . Heart disease Mother   . Heart attack Mother        died from it  . Hypertension Mother   . Arthritis Mother   . Stroke Father   . Cancer Maternal Grandmother   . Heart attack Maternal Aunt   . Renal Disease Maternal Aunt   . Multiple myeloma Maternal Uncle   . Emphysema Maternal Uncle   . Heart disease Sister   . Thyroid disease Sister   . Healthy Son        x3  . Healthy Daughter        x5    Allergies  Allergen Reactions  . Pineapple Swelling    Reaction to fresh pineapple - tongue swelling    Medication list has been reviewed and updated.  Current Outpatient Medications on File Prior to Visit  Medication Sig Dispense Refill  . acetaminophen (TYLENOL) 325 MG tablet Take 650 mg by mouth every 6 (six) hours as needed (pain).     Marland Kitchen amLODipine (NORVASC) 10 MG tablet Take 1 tablet (10 mg total) by mouth daily. 90 tablet 3  . aspirin EC 81 MG EC tablet Take 1 tablet (81 mg total) by mouth daily.    . hydrALAZINE (APRESOLINE) 50 MG tablet Take 1 tablet (50 mg total) by mouth 3 (three) times daily. 90 tablet 0  . levothyroxine (SYNTHROID, LEVOTHROID) 200 MCG tablet TAKE 1 TABLET EVERY DAY BEFORE BREAKFAST 90 tablet 2  . lovastatin (MEVACOR) 20 MG tablet Take 1 tablet (20 mg total) by mouth at bedtime. 90 tablet 2  . nitroGLYCERIN (NITROSTAT) 0.4 MG SL tablet Place 1 tablet (0.4 mg total) under the tongue every 5 (five) minutes x 3 doses as needed for chest  pain. 25 tablet 5  . OVER THE COUNTER MEDICATION Place 1 drop into both eyes daily as needed (dry eyes). Over the counter lubricating eye drops    . ranitidine (ZANTAC) 300 MG tablet Take 0.5  tablets (150 mg total) by mouth 2 (two) times daily. 90 tablet 2   No current facility-administered medications on file prior to visit.     Review of Systems:  As per HPI- otherwise negative. No fever or chills   Physical Examination: Vitals:   12/02/17 1515  BP: (!) 160/70  Pulse: (!) 45  Temp: 99.1 F (37.3 C)  SpO2: 99%   Vitals:   12/02/17 1515  Weight: 170 lb 9.6 oz (77.4 kg)  Height: '5\' 3"'$  (1.6 m)   Body mass index is 30.22 kg/m. Ideal Body Weight: Weight in (lb) to have BMI = 25: 140.8  GEN: WDWN, NAD, Non-toxic, A & O x 3, overweight, looks well HEENT: Atraumatic, Normocephalic. Neck supple. No masses, No LAD. Ears and Nose: No external deformity. CV: RRR, No M/G/R. No JVD. No thrill. No extra heart sounds. PULM: CTA B, no wheezes, crackles, rhonchi. No retractions. No resp. distress. No accessory muscle use. ABD: S, NT, ND, +BS. No rebound. No HSM. EXTR: No c/c/e NEURO Normal gait.  PSYCH: Normally interactive. Conversant. Not depressed or anxious appearing.  Calm demeanor.   EKG: similar to a year ago, but her rate has dropped from the 50s to 44.  Stable RBBB Assessment and Plan: Bradycardia - Plan: EKG 12-Lead, losartan (COZAAR) 50 MG tablet  Essential hypertension, benign - Plan: Basic metabolic panel, CBC, losartan (COZAAR) 50 MG tablet  Acquired hypothyroidism - Plan: TSH  Medication monitoring encounter - Plan: Basic metabolic panel, CBC  Chronic renal insufficiency, unspecified stage - Plan: Basic metabolic panel  Here today for a follow-up visit Incidentally noted to be significantly bradycardic.  Will DC her BB, replace with a low dose of losartan at 25 mg She will see me on 12/24 for a recheck (will be out of town next week) to check her pulse and  BP Will also touch base with Dr. Tamala Julian about follow-up for her  Will plan further follow- up pending labs.  Defer adding trazodone at this time due to her bradycardia  Signed Lamar Blinks, MD  Received her labs 12/16 Results for orders placed or performed in visit on 63/87/56  Basic metabolic panel  Result Value Ref Range   Sodium 139 135 - 145 mEq/L   Potassium 4.7 3.5 - 5.1 mEq/L   Chloride 108 96 - 112 mEq/L   CO2 24 19 - 32 mEq/L   Glucose, Bld 82 70 - 99 mg/dL   BUN 39 (H) 6 - 23 mg/dL   Creatinine, Ser 1.85 (H) 0.40 - 1.20 mg/dL   Calcium 9.6 8.4 - 10.5 mg/dL   GFR 33.80 (L) >60.00 mL/min  TSH  Result Value Ref Range   TSH 3.33 0.35 - 4.50 uIU/mL  CBC  Result Value Ref Range   WBC 8.9 4.0 - 10.5 K/uL   RBC 4.25 3.87 - 5.11 Mil/uL   Platelets 394.0 150.0 - 400.0 K/uL   Hemoglobin 11.2 (L) 12.0 - 15.0 g/dL   HCT 34.8 (L) 36.0 - 46.0 %   MCV 81.9 78.0 - 100.0 fl   MCHC 32.0 30.0 - 36.0 g/dL   RDW 15.0 11.5 - 15.5 %   We stopped her BB at last visit- wonder if her creat may improve with better perfusion, will plan to recheck when she comes back in Mild anemia- likely related to her CRI.   Better than in the past Pt is seeing me on 12/24 and we can repeat a BMP then  TSH is in range-  good news  Last note from Dr. Florene Glen in July- he had requested a 6 month follow-up at that time. After repeat BMP will send him an update  Message to pt Your labs are overall ok- you have mild anemia but this is likely due to your kidney disease, and is better than in the past.   Your chronic kidney insufficiency is a bit worse than is typical for you, we will plan to repeat this at our visit on 12/24 Take care! [place]

## 2017-12-02 ENCOUNTER — Ambulatory Visit (INDEPENDENT_AMBULATORY_CARE_PROVIDER_SITE_OTHER): Payer: Medicare HMO | Admitting: Family Medicine

## 2017-12-02 ENCOUNTER — Encounter: Payer: Self-pay | Admitting: Family Medicine

## 2017-12-02 VITALS — BP 160/70 | HR 45 | Temp 99.1°F | Ht 63.0 in | Wt 170.6 lb

## 2017-12-02 DIAGNOSIS — E039 Hypothyroidism, unspecified: Secondary | ICD-10-CM

## 2017-12-02 DIAGNOSIS — R001 Bradycardia, unspecified: Secondary | ICD-10-CM

## 2017-12-02 DIAGNOSIS — Z5181 Encounter for therapeutic drug level monitoring: Secondary | ICD-10-CM | POA: Diagnosis not present

## 2017-12-02 DIAGNOSIS — I10 Essential (primary) hypertension: Secondary | ICD-10-CM | POA: Diagnosis not present

## 2017-12-02 DIAGNOSIS — N189 Chronic kidney disease, unspecified: Secondary | ICD-10-CM | POA: Diagnosis not present

## 2017-12-02 MED ORDER — LOSARTAN POTASSIUM 50 MG PO TABS: 25.0000 mg | ORAL_TABLET | Freq: Every day | ORAL | 3 refills | Status: DC

## 2017-12-02 NOTE — Patient Instructions (Signed)
It was good to see you today We are going to stop the atenolol as your pulse is quite slow Please start on losartan 1/2 tablet in place of the atenolol  Please let me know if you have any problems or concerns I will be in touch with your labs asap  Please come and see me in 12/24 to check on your pulse, and then I will set you up to see cardiology after Christmas

## 2017-12-03 LAB — CBC
HCT: 34.8 % — ABNORMAL LOW (ref 36.0–46.0)
Hemoglobin: 11.2 g/dL — ABNORMAL LOW (ref 12.0–15.0)
MCHC: 32 g/dL (ref 30.0–36.0)
MCV: 81.9 fl (ref 78.0–100.0)
Platelets: 394 10*3/uL (ref 150.0–400.0)
RBC: 4.25 Mil/uL (ref 3.87–5.11)
RDW: 15 % (ref 11.5–15.5)
WBC: 8.9 10*3/uL (ref 4.0–10.5)

## 2017-12-03 LAB — BASIC METABOLIC PANEL
BUN: 39 mg/dL — ABNORMAL HIGH (ref 6–23)
CO2: 24 mEq/L (ref 19–32)
Calcium: 9.6 mg/dL (ref 8.4–10.5)
Chloride: 108 mEq/L (ref 96–112)
Creatinine, Ser: 1.85 mg/dL — ABNORMAL HIGH (ref 0.40–1.20)
GFR: 33.8 mL/min — ABNORMAL LOW (ref >60.00)
Glucose, Bld: 82 mg/dL (ref 70–99)
Potassium: 4.7 mEq/L (ref 3.5–5.1)
Sodium: 139 mEq/L (ref 135–145)

## 2017-12-03 LAB — TSH: TSH: 3.33 u[IU]/mL (ref 0.35–4.50)

## 2017-12-05 ENCOUNTER — Encounter: Payer: Self-pay | Admitting: Family Medicine

## 2017-12-13 ENCOUNTER — Ambulatory Visit: Payer: Medicare HMO | Admitting: Family Medicine

## 2017-12-13 ENCOUNTER — Other Ambulatory Visit: Payer: Self-pay | Admitting: Family Medicine

## 2017-12-15 DIAGNOSIS — R002 Palpitations: Secondary | ICD-10-CM | POA: Insufficient documentation

## 2017-12-15 DIAGNOSIS — G4730 Sleep apnea, unspecified: Secondary | ICD-10-CM | POA: Insufficient documentation

## 2017-12-15 DIAGNOSIS — M1990 Unspecified osteoarthritis, unspecified site: Secondary | ICD-10-CM | POA: Insufficient documentation

## 2017-12-15 DIAGNOSIS — R519 Headache, unspecified: Secondary | ICD-10-CM | POA: Insufficient documentation

## 2017-12-15 DIAGNOSIS — R51 Headache: Secondary | ICD-10-CM

## 2017-12-15 DIAGNOSIS — G43909 Migraine, unspecified, not intractable, without status migrainosus: Secondary | ICD-10-CM | POA: Insufficient documentation

## 2017-12-15 DIAGNOSIS — Z9289 Personal history of other medical treatment: Secondary | ICD-10-CM | POA: Insufficient documentation

## 2017-12-15 DIAGNOSIS — K5792 Diverticulitis of intestine, part unspecified, without perforation or abscess without bleeding: Secondary | ICD-10-CM | POA: Insufficient documentation

## 2017-12-15 DIAGNOSIS — E785 Hyperlipidemia, unspecified: Secondary | ICD-10-CM | POA: Insufficient documentation

## 2017-12-15 DIAGNOSIS — Z8739 Personal history of other diseases of the musculoskeletal system and connective tissue: Secondary | ICD-10-CM | POA: Insufficient documentation

## 2017-12-15 DIAGNOSIS — I10 Essential (primary) hypertension: Secondary | ICD-10-CM | POA: Insufficient documentation

## 2017-12-15 DIAGNOSIS — B019 Varicella without complication: Secondary | ICD-10-CM | POA: Insufficient documentation

## 2017-12-16 ENCOUNTER — Encounter: Payer: Self-pay | Admitting: Family Medicine

## 2017-12-16 ENCOUNTER — Ambulatory Visit (INDEPENDENT_AMBULATORY_CARE_PROVIDER_SITE_OTHER): Payer: Medicare HMO | Admitting: Family Medicine

## 2017-12-16 VITALS — BP 180/80 | HR 73 | Temp 99.6°F | Resp 16 | Ht 63.0 in | Wt 178.0 lb

## 2017-12-16 DIAGNOSIS — N289 Disorder of kidney and ureter, unspecified: Secondary | ICD-10-CM | POA: Diagnosis not present

## 2017-12-16 DIAGNOSIS — I10 Essential (primary) hypertension: Secondary | ICD-10-CM

## 2017-12-16 DIAGNOSIS — M10072 Idiopathic gout, left ankle and foot: Secondary | ICD-10-CM

## 2017-12-16 DIAGNOSIS — R001 Bradycardia, unspecified: Secondary | ICD-10-CM | POA: Diagnosis not present

## 2017-12-16 LAB — BASIC METABOLIC PANEL
BUN: 26 mg/dL — ABNORMAL HIGH (ref 6–23)
CALCIUM: 9.7 mg/dL (ref 8.4–10.5)
CHLORIDE: 108 meq/L (ref 96–112)
CO2: 21 meq/L (ref 19–32)
CREATININE: 1.52 mg/dL — AB (ref 0.40–1.20)
GFR: 42.4 mL/min — ABNORMAL LOW (ref >60.00)
GLUCOSE: 127 mg/dL — AB (ref 70–99)
Potassium: 3.9 mEq/L (ref 3.5–5.1)
Sodium: 137 mEq/L (ref 135–145)

## 2017-12-16 NOTE — Progress Notes (Signed)
[place] at [facility] [address] [place], [place] [address] [phone]  Date:  12/16/2017   Name:  Carolyn Myers   DOB:  06-30-1936   MRN:  [id number]  PCP:  Darreld Mclean, MD    Chief Complaint: No chief complaint on file.   History of Present Illness:  Carolyn Myers is a 81 y.o. very pleasant female patient who presents with the following:  Here today to follow-up on bradycardia and medication adjustment.  I saw he on 12/13 when she was noted to be bradycardic to the 40s but still hypertensive We made medication changes as below and we are pleased that her pulse is better today.  She reports feeling quite well overall Here today with her daughter who contributes to the history   Bradycardia - Plan: EKG 12-Lead, losartan (COZAAR) 50 MG tablet Essential hypertension, benign - Plan: Basic metabolic panel, CBC, losartan (COZAAR) 50 MG tablet Acquired hypothyroidism - Plan: TSH Medication monitoring encounter - Plan: Basic metabolic panel, CBC Chronic renal insufficiency, unspecified stage - Plan: Basic metabolic panel  Here today for a follow-up visit Incidentally noted to be significantly bradycardic.  Will DC her BB, replace with a low dose of losartan at 25 mg She will see me on 12/24 for a recheck (will be out of town next week) to check her pulse and BP Will also touch base with Dr. Tamala Julian about follow-up for her  We had planned to recheck on 12/24 but she must have had a change in plans and canceled that appt Would like to repeat her BMP today- increased perfusion may have improved her creatinine   Pt notes that "at first I had a little more energy" but then she got a gout attack last night in her left foot.   She has not had any gout sx in a couple of years.   She does have some allopurinol on hand - she took some that she had and her foot does feel a lot better today compared with yesterday She ate  some fruit cake but nothing else unusual recently  She fell this am while coming to my office.  She was sitting on her walker (which has a seat) and tipped over. She fell onto the grass, did not seem to get hurt.  She did not hit her head and denies any pain in her body.  Asked her several times and she is adamant that she is not hurt and does not need me to to check anything for her   BP Readings from Last 3 Encounters:  12/16/17 (!) 191/68  12/02/17 (!) 160/70  06/14/17 (!) 142/70   Pulse Readings from Last 3 Encounters:  12/16/17 73  12/02/17 (!) 45  06/14/17 60   They did not check her BP over the last week Went over her recent TSH- looks fine  Lab Results  Component Value Date   TSH 3.33 12/02/2017    Patient Active Problem List   Diagnosis Date Noted  . Sleep apnea   . Palpitations   . Migraine   . Hypertension   . Hyperlipidemia   . History of gout   . History of blood transfusion   . Frequent headaches   . Diverticulitis   . Chicken pox   . Arthritis   . CKD (chronic kidney disease) stage 3, GFR 30-59 ml/min (HCC) 02/23/2017  . Age-related osteoporosis without current pathological fracture 02/23/2017  .  Esophageal reflux 05/08/2016  . Renal artery stenosis ([place]) 02/22/2016  . Hypothyroidism 02/22/2016  . Chest pain with high risk for cardiac etiology 02/21/2016  . Gout   . SOB (shortness of breath) on exertion 10/06/2015  . Accelerated hypertension 10/06/2015  . Essential hypertension, benign 11/26/2013  . Hyperlipidemia LDL goal <100 11/26/2013    Past Medical History:  Diagnosis Date  . Arthritis    "minor in my shoulders" (02/21/2016)  . Chicken pox   . Diverticulitis   . Frequent headaches    "maybe twice/week" (03/12/2016)  . Gout    Right Foot  . History of blood transfusion    "said my blood was low"  . History of gout   . Hyperlipidemia   . Hypertension   . Hypothyroidism   . Migraine    "maybe twice/year" (02/21/2016)  . Palpitations     "doctor thought it was related to my thyroid"  . Sleep apnea    "I don't wear my mask" (02/21/2016)    Past Surgical History:  Procedure Laterality Date  . ABDOMINAL HYSTERECTOMY     "for fibroid tumors"  . TUBAL LIGATION      Social History   Tobacco Use  . Smoking status: Never Smoker  . Smokeless tobacco: Never Used  Substance Use Topics  . Alcohol use: No  . Drug use: No    Family History  Problem Relation Age of Onset  . Heart disease Mother   . Heart attack Mother        died from it  . Hypertension Mother   . Arthritis Mother   . Stroke Father   . Cancer Maternal Grandmother   . Heart attack Maternal Aunt   . Renal Disease Maternal Aunt   . Multiple myeloma Maternal Uncle   . Emphysema Maternal Uncle   . Heart disease Sister   . Thyroid disease Sister   . Healthy Son        x3  . Healthy Daughter        x5    Allergies  Allergen Reactions  . Pineapple Swelling    Reaction to fresh pineapple - tongue swelling    Medication list has been reviewed and updated.  Current Outpatient Medications on File Prior to Visit  Medication Sig Dispense Refill  . acetaminophen (TYLENOL) 325 MG tablet Take 650 mg by mouth every 6 (six) hours as needed (pain).     Marland Kitchen amLODipine (NORVASC) 10 MG tablet Take 1 tablet (10 mg total) by mouth daily. 90 tablet 3  . aspirin EC 81 MG EC tablet Take 1 tablet (81 mg total) by mouth daily.    . hydrALAZINE (APRESOLINE) 50 MG tablet Take 1 tablet (50 mg total) by mouth 3 (three) times daily. 90 tablet 1  . levothyroxine (SYNTHROID, LEVOTHROID) 200 MCG tablet TAKE 1 TABLET EVERY DAY BEFORE BREAKFAST 90 tablet 2  . losartan (COZAAR) 50 MG tablet Take 0.5 tablets (25 mg total) by mouth daily. 30 tablet 3  . lovastatin (MEVACOR) 20 MG tablet Take 1 tablet (20 mg total) by mouth at bedtime. 90 tablet 2  . nitroGLYCERIN (NITROSTAT) 0.4 MG SL tablet Place 1 tablet (0.4 mg total) under the tongue every 5 (five) minutes x 3 doses as needed for  chest pain. 25 tablet 5  . OVER THE COUNTER MEDICATION Place 1 drop into both eyes daily as needed (dry eyes). Over the counter lubricating eye drops    . ranitidine (ZANTAC) 300 MG tablet Take  0.5 tablets (150 mg total) by mouth 2 (two) times daily. 90 tablet 2   No current facility-administered medications on file prior to visit.     Review of Systems:  As per HPI- otherwise negative.   Physical Examination: Vitals:   12/16/17 0925  BP: (!) 191/68  Pulse: 73  Resp: 16  Temp: 99.6 F (37.6 C)  SpO2: 100%   Vitals:   12/16/17 0925  Weight: 178 lb (80.7 kg)  Height: _0  (1.6 m)   Body mass index is 31.53 kg/m. Ideal Body Weight: Weight in (lb) to have BMI = 25: 140.8  GEN: WDWN, NAD, Non-toxic, A & O x 3, looks well, overweight HEENT: Atraumatic, Normocephalic. Neck supple. No masses, No LAD. Ears and Nose: No external deformity. CV: RRR, No M/G/R. No JVD. No thrill. No extra heart sounds. PULM: CTA B, no wheezes, crackles, rhonchi. No retractions. No resp. distress. No accessory muscle use. Bradycardia is resolved EXTR: No c/c/e NEURO Normal gait for pt- slow, uses a walker PSYCH: Normally interactive. Conversant. Not depressed or anxious appearing.  Calm demeanor.  Left foot: at this time her foot is essentially normal, minimal tenderness over the proximal dorsum of the foot.  Pt states that last night this area was very tender but is now much better   Assessment and Plan: Hypertension, unspecified type  Renal insufficiency - Plan: Basic metabolic panel  Acute idiopathic gout of left foot  Bradycardia  Here today for a recheck Her pulse rate is much better off BB BP still high- will increase her losartan to 50 mg  Her gout is much better since yesterday per her report- she has allopurinol to use as needed Repeat BMP today She actually has an appt with Dr Tamala Julian on 1/16 which will be perfect for a recheck  Signed Lamar Blinks, MD  Received her labs-  indeed her renal function is improved. Will continue to monitor- letter to pt Results for orders placed or performed in visit on 97/02/63  Basic metabolic panel  Result Value Ref Range   Sodium 137 135 - 145 mEq/L   Potassium 3.9 3.5 - 5.1 mEq/L   Chloride 108 96 - 112 mEq/L   CO2 21 19 - 32 mEq/L   Glucose, Bld 127 (H) 70 - 99 mg/dL   BUN 26 (H) 6 - 23 mg/dL   Creatinine, Ser 1.52 (H) 0.40 - 1.20 mg/dL   Calcium 9.7 8.4 - 10.5 mg/dL   GFR 42.40 (L) >60.00 mL/min

## 2017-12-16 NOTE — Patient Instructions (Signed)
Good to see you!  Your pulse is much better Please try increasing your losartan to 50 mg a day. However, if you have any symptoms such as feeling faint please let me know Please do schedule to see Dr. Tamala Julian in the next month or so I will recheck your kidney function today and will be in touch Please alert me if you continue to have issues with frequent gout symptoms

## 2018-01-05 ENCOUNTER — Ambulatory Visit: Payer: Medicare HMO | Admitting: Interventional Cardiology

## 2018-01-12 ENCOUNTER — Other Ambulatory Visit: Payer: Self-pay | Admitting: Family Medicine

## 2018-01-12 DIAGNOSIS — K219 Gastro-esophageal reflux disease without esophagitis: Secondary | ICD-10-CM

## 2018-01-26 ENCOUNTER — Ambulatory Visit: Payer: Medicare HMO | Admitting: Interventional Cardiology

## 2018-02-11 ENCOUNTER — Other Ambulatory Visit: Payer: Self-pay | Admitting: Family Medicine

## 2018-02-24 DIAGNOSIS — E039 Hypothyroidism, unspecified: Secondary | ICD-10-CM | POA: Diagnosis not present

## 2018-02-24 DIAGNOSIS — E785 Hyperlipidemia, unspecified: Secondary | ICD-10-CM | POA: Diagnosis not present

## 2018-02-24 DIAGNOSIS — K5900 Constipation, unspecified: Secondary | ICD-10-CM | POA: Diagnosis not present

## 2018-02-24 DIAGNOSIS — Z6830 Body mass index (BMI) 30.0-30.9, adult: Secondary | ICD-10-CM | POA: Diagnosis not present

## 2018-02-24 DIAGNOSIS — Z7722 Contact with and (suspected) exposure to environmental tobacco smoke (acute) (chronic): Secondary | ICD-10-CM | POA: Diagnosis not present

## 2018-02-24 DIAGNOSIS — I10 Essential (primary) hypertension: Secondary | ICD-10-CM | POA: Diagnosis not present

## 2018-02-24 DIAGNOSIS — E669 Obesity, unspecified: Secondary | ICD-10-CM | POA: Diagnosis not present

## 2018-02-24 DIAGNOSIS — K219 Gastro-esophageal reflux disease without esophagitis: Secondary | ICD-10-CM | POA: Diagnosis not present

## 2018-02-24 DIAGNOSIS — G8929 Other chronic pain: Secondary | ICD-10-CM | POA: Diagnosis not present

## 2018-02-24 DIAGNOSIS — H269 Unspecified cataract: Secondary | ICD-10-CM | POA: Diagnosis not present

## 2018-03-13 ENCOUNTER — Other Ambulatory Visit: Payer: Self-pay | Admitting: Family Medicine

## 2018-04-13 ENCOUNTER — Other Ambulatory Visit: Payer: Self-pay | Admitting: Family Medicine

## 2018-05-03 NOTE — Progress Notes (Addendum)
[place] [facility] [address], [place], [place] [address] [phone]  Date:  05/04/2018   Name:  Carolyn Myers   DOB:  Oct 04, 1936   MRN:  [id number]  PCP:  Darreld Mclean, MD    Chief Complaint: Hypertension (6 month follow up) and Stomach Issues (since yesterday, vomiting, no diarrhea, no fever, has had chills)   History of Present Illness:  Carolyn Myers is a 82 y.o. very pleasant female patient who presents with the following:  Following up today Last seen here in December:  Here today to follow-up on bradycardia and medication adjustment.  I saw he on 12/13 when she was noted to be bradycardic to the 40s but still hypertensive We made medication changes as below and we are pleased that her pulse is better today.  She reports feeling quite well overall Here today with her daughter who contributes to the history   Bradycardia- Plan: EKG 12-Lead, losartan (COZAAR) 50 MG tablet Essential hypertension, benign - Plan: Basic metabolic panel, CBC, losartan (COZAAR) 50 MG tablet Acquired hypothyroidism - Plan: TSH Medication monitoring encounter - Plan: Basic metabolic panel, CBC Chronic renal insufficiency, unspecified stage - Plan: Basic metabolic panel Here today for a follow-up visit Incidentally noted to be significantly bradycardic.  Will DC her BB, replace with a low dose of losartan at 25 mg She will see me on 12/24 for a recheck (will be out of town next week) to check her pulse and BP Will also touch base with Dr. Tamala Julian about follow-up for her We had planned to recheck on 12/24 but she must have had a change in plans and canceled that appt Would like to repeat her BMP today- increased perfusion may have improved her creatinine  Pt notes that "at first I had a little more energy" but then she got a gout attack last night in her left foot.   She has not had any gout sx in a couple of years.   She  does have some allopurinol on hand - she took some that she had and her foot does feel a lot better today compared with yesterday She ate some fruit cake but nothing else unusual recently She fell this am while coming to my office.  She was sitting on her walker (which has a seat) and tipped over. She fell onto the grass, did not seem to get hurt.  She did not hit her head and denies any pain in her body.  Asked her several times and she is adamant that she is not hurt and does not need me to to check anything for her   Her cardiologist is Daneen Schick- however has not seen him since 11/17-  1. Blood pressures under control on the current medical regimen. Low salt diet is reemphasized. Compliance with medical regimen is also discussed. 2. The chest pain is sounding more and more like GI related discomfort. Isosorbide mononitrate was discontinued without any increase in episodes. GI cocktail and ranitidine seemed helped chest discomfort more than anything. We will continue her current regimen which includes a beta blocker. She can use nitroglycerin when necessary if it helps. 3. Lipids are not available in our current laboratory data. This disease are identified and managed by PCP. 4. This will be followed with serial duplex studies. Also need to monitor for the development of intestinal angina. 5. Kidney function is stable but will be followed serially.  Lab  Results  Component Value Date   TSH 3.33 12/02/2017   BP Readings from Last 3 Encounters:  05/04/18 134/78  12/16/17 (!) 180/80  12/02/17 (!) 160/70   Pulse Readings from Last 3 Encounters:  05/04/18 63  12/16/17 73  12/02/17 (!) 45   They do follow her BP and pulse at home  Yesterday her BP was 174/"something" but today looks good - generally she has been under good control Her gout has been quiet recently , no sx   She notes that yesterday she had vomiting and felt sick to her stomach Notes that a "gi bug" was going around   Vomiting is resolved today but she still feels a bit queasy  No diarrhea but she did have loose stools yesterday She has had just water so far today, and a little oatmeal Loose stools resolved  abd discomfort yesterday- better today but not 100% gone No fever or chills Wt Readings from Last 3 Encounters:  05/04/18 172 lb 3.2 oz (78.1 kg)  12/16/17 178 lb (80.7 kg)  12/02/17 170 lb 9.6 oz (77.4 kg)   No rashes Weight is stable    Patient Active Problem List   Diagnosis Date Noted  . Sleep apnea   . Palpitations   . Migraine   . Hyperlipidemia   . History of gout   . History of blood transfusion   . Frequent headaches   . Diverticulitis   . Chicken pox   . Arthritis   . CKD (chronic kidney disease) stage 3, GFR 30-59 ml/min (HCC) 02/23/2017  . Age-related osteoporosis without current pathological fracture 02/23/2017  . Esophageal reflux 05/08/2016  . Renal artery stenosis ([place]) 02/22/2016  . Hypothyroidism 02/22/2016  . Chest pain with high risk for cardiac etiology 02/21/2016  . Gout   . SOB (shortness of breath) on exertion 10/06/2015  . Accelerated hypertension 10/06/2015  . Essential hypertension, benign 11/26/2013  . Hyperlipidemia LDL goal <100 11/26/2013    Past Medical History:  Diagnosis Date  . Arthritis    "minor in my shoulders" (02/21/2016)  . Chicken pox   . Diverticulitis   . Frequent headaches    "maybe twice/week" (03/12/2016)  . Gout    Right Foot  . History of blood transfusion    "said my blood was low"  . History of gout   . Hyperlipidemia   . Hypertension   . Hypothyroidism   . Migraine    "maybe twice/year" (02/21/2016)  . Palpitations    "doctor thought it was related to my thyroid"  . Sleep apnea    "I don't wear my mask" (02/21/2016)    Past Surgical History:  Procedure Laterality Date  . ABDOMINAL HYSTERECTOMY     "for fibroid tumors"  . TUBAL LIGATION      Social History   Tobacco Use  . Smoking status: Never Smoker  .  Smokeless tobacco: Never Used  Substance Use Topics  . Alcohol use: No  . Drug use: No    Family History  Problem Relation Age of Onset  . Heart disease Mother   . Heart attack Mother        died from it  . Hypertension Mother   . Arthritis Mother   . Stroke Father   . Cancer Maternal Grandmother   . Heart attack Maternal Aunt   . Renal Disease Maternal Aunt   . Multiple myeloma Maternal Uncle   . Emphysema Maternal Uncle   . Heart disease Sister   .  Thyroid disease Sister   . Healthy Son        x3  . Healthy Daughter        x5    Allergies  Allergen Reactions  . Pineapple Swelling    Reaction to fresh pineapple - tongue swelling    Medication list has been reviewed and updated.  Current Outpatient Medications on File Prior to Visit  Medication Sig Dispense Refill  . acetaminophen (TYLENOL) 325 MG tablet Take 650 mg by mouth every 6 (six) hours as needed (pain).     Marland Kitchen aspirin EC 81 MG EC tablet Take 1 tablet (81 mg total) by mouth daily.    . nitroGLYCERIN (NITROSTAT) 0.4 MG SL tablet Place 1 tablet (0.4 mg total) under the tongue every 5 (five) minutes x 3 doses as needed for chest pain. 25 tablet 5  . OVER THE COUNTER MEDICATION Place 1 drop into both eyes daily as needed (dry eyes). Over the counter lubricating eye drops     No current facility-administered medications on file prior to visit.     Review of Systems:  As per HPI- otherwise negative. No urinary sx No hematemesis     Physical Examination: Vitals:   05/04/18 1522  BP: 134/78  Pulse: 63  Resp: 16  Temp: 98.4 F (36.9 C)  SpO2: 98%   Vitals:   05/04/18 1522  Weight: 172 lb 3.2 oz (78.1 kg)  Height: _0  (1.6 m)   Body mass index is 30.5 kg/m. Ideal Body Weight: Weight in (lb) to have BMI = 25: 140.8  GEN: WDWN, NAD, Non-toxic, A & O x 3, elderly lady, looks well, here today with her daughter  HEENT: Atraumatic, Normocephalic. Neck supple. No masses, No LAD. Bilateral TM wnl,  oropharynx normal.  PEERL,EOMI.   Ears and Nose: No external deformity. CV: RRR, No M/G/R. No JVD. No thrill. No extra heart sounds. PULM: CTA B, no wheezes, crackles, rhonchi. No retractions. No resp. distress. No accessory muscle use. ABD: S, NT, ND, +BS. No rebound. No HSM.  Belly is benign today, she has minimal tenderness over the entire lower belly  EXTR: No c/c/e NEURO Normal gait for pt- slow PSYCH: Normally interactive. Conversant. Not depressed or anxious appearing.  Calm demeanor.    Assessment and Plan: Renal insufficiency - Plan: Comprehensive metabolic panel  Hypertension, unspecified type  Acute idiopathic gout of left foot  Bradycardia - Plan: losartan (COZAAR) 50 MG tablet  Acquired hypothyroidism - Plan: TSH, levothyroxine (SYNTHROID, LEVOTHROID) 200 MCG tablet  Mixed hyperlipidemia - Plan: Lipid panel, lovastatin (MEVACOR) 20 MG tablet  Hyperglycemia - Plan: Hemoglobin A1c  Essential hypertension, benign - Plan: losartan (COZAAR) 50 MG tablet, amLODipine (NORVASC) 10 MG tablet, hydrALAZINE (APRESOLINE) 50 MG tablet  Gastroesophageal reflux disease, esophagitis presence not specified - Plan: ranitidine (ZANTAC) 300 MG tablet  Refilled some medications for her and will get labs today Her BP and Pulse look great on current regimen She noted vomiting yesterday, now resolved Will obtain labs She will alert me if not continuing to improve and get back to normal Encouraged her to see cardiology soon for a routine visit See me in 6 months   Signed Lamar Blinks, MD   Message to pt:  I was concerned about some of your labs, especially your thyroid and cholesterol.  Have you been taking your thyroid medication or did you run out for a while?  Kidney function is about like normal for you A1c does not show diabetes  Please let me know about your thyroid and cholesterol meds- you can reply to me directly here   Received her labs 5/17- called her daughter who  generally helps her, but no answer. Will send mychart and try back later Tried again on 5/19- no answer Results for orders placed or performed in visit on 05/04/18  Comprehensive metabolic panel  Result Value Ref Range   Sodium 139 135 - 145 mEq/L   Potassium 4.0 3.5 - 5.1 mEq/L   Chloride 106 96 - 112 mEq/L   CO2 25 19 - 32 mEq/L   Glucose, Bld 91 70 - 99 mg/dL   BUN 26 (H) 6 - 23 mg/dL   Creatinine, Ser 1.78 (H) 0.40 - 1.20 mg/dL   Total Bilirubin 0.4 0.2 - 1.2 mg/dL   Alkaline Phosphatase 79 39 - 117 U/L   AST 14 0 - 37 U/L   ALT 8 0 - 35 U/L   Total Protein 7.3 6.0 - 8.3 g/dL   Albumin 3.9 3.5 - 5.2 g/dL   Calcium 9.4 8.4 - 10.5 mg/dL   GFR 35.30 (L) >60.00 mL/min  TSH  Result Value Ref Range   TSH 81.50 Repeated and verified X2. (H) 0.35 - 4.50 uIU/mL  Lipid panel  Result Value Ref Range   Cholesterol 284 (H) 0 - 200 mg/dL   Triglycerides 69.0 0.0 - 149.0 mg/dL   HDL 70.60 >39.00 mg/dL   VLDL 13.8 0.0 - 40.0 mg/dL   LDL Cholesterol 200 (H) 0 - 99 mg/dL   Total CHOL/HDL Ratio 4    NonHDL 213.79   Hemoglobin A1c  Result Value Ref Range   Hgb A1c MFr Bld 5.2 4.6 - 6.5 %

## 2018-05-04 ENCOUNTER — Ambulatory Visit (INDEPENDENT_AMBULATORY_CARE_PROVIDER_SITE_OTHER): Payer: Medicare HMO | Admitting: Family Medicine

## 2018-05-04 ENCOUNTER — Encounter: Payer: Self-pay | Admitting: Family Medicine

## 2018-05-04 VITALS — BP 134/78 | HR 63 | Temp 98.4°F | Resp 16 | Ht 63.0 in | Wt 172.2 lb

## 2018-05-04 DIAGNOSIS — R001 Bradycardia, unspecified: Secondary | ICD-10-CM

## 2018-05-04 DIAGNOSIS — K219 Gastro-esophageal reflux disease without esophagitis: Secondary | ICD-10-CM | POA: Diagnosis not present

## 2018-05-04 DIAGNOSIS — M10072 Idiopathic gout, left ankle and foot: Secondary | ICD-10-CM

## 2018-05-04 DIAGNOSIS — E782 Mixed hyperlipidemia: Secondary | ICD-10-CM | POA: Diagnosis not present

## 2018-05-04 DIAGNOSIS — E039 Hypothyroidism, unspecified: Secondary | ICD-10-CM | POA: Diagnosis not present

## 2018-05-04 DIAGNOSIS — I10 Essential (primary) hypertension: Secondary | ICD-10-CM

## 2018-05-04 DIAGNOSIS — N289 Disorder of kidney and ureter, unspecified: Secondary | ICD-10-CM | POA: Diagnosis not present

## 2018-05-04 DIAGNOSIS — R739 Hyperglycemia, unspecified: Secondary | ICD-10-CM

## 2018-05-04 MED ORDER — RANITIDINE HCL 300 MG PO TABS: 150.0000 mg | ORAL_TABLET | Freq: Two times a day (BID) | ORAL | 3 refills | Status: DC

## 2018-05-04 MED ORDER — AMLODIPINE BESYLATE 10 MG PO TABS: 10.0000 mg | ORAL_TABLET | Freq: Every day | ORAL | 3 refills | Status: DC

## 2018-05-04 MED ORDER — HYDRALAZINE HCL 50 MG PO TABS: 50.0000 mg | ORAL_TABLET | Freq: Three times a day (TID) | ORAL | 3 refills | Status: DC

## 2018-05-04 MED ORDER — LEVOTHYROXINE SODIUM 200 MCG PO TABS: ORAL_TABLET | ORAL | 3 refills | Status: DC

## 2018-05-04 MED ORDER — LOSARTAN POTASSIUM 50 MG PO TABS: 50.0000 mg | ORAL_TABLET | Freq: Every day | ORAL | 3 refills | Status: DC

## 2018-05-04 MED ORDER — LOVASTATIN 20 MG PO TABS: 20.0000 mg | ORAL_TABLET | Freq: Every day | ORAL | 3 refills | Status: DC

## 2018-05-04 NOTE — Patient Instructions (Addendum)
Please do follow-up with your cardiologist in the next few months   Let me know if you have any further stomach symptoms  I will follow-up with your labs asap Please see me in 6 months

## 2018-05-05 LAB — COMPREHENSIVE METABOLIC PANEL
ALBUMIN: 3.9 g/dL (ref 3.5–5.2)
ALK PHOS: 79 U/L (ref 39–117)
ALT: 8 U/L (ref 0–35)
AST: 14 U/L (ref 0–37)
BUN: 26 mg/dL — ABNORMAL HIGH (ref 6–23)
CO2: 25 mEq/L (ref 19–32)
Calcium: 9.4 mg/dL (ref 8.4–10.5)
Chloride: 106 mEq/L (ref 96–112)
Creatinine, Ser: 1.78 mg/dL — ABNORMAL HIGH (ref 0.40–1.20)
GFR: 35.3 mL/min — AB (ref >60.00)
Glucose, Bld: 91 mg/dL (ref 70–99)
POTASSIUM: 4 meq/L (ref 3.5–5.1)
Sodium: 139 mEq/L (ref 135–145)
TOTAL PROTEIN: 7.3 g/dL (ref 6.0–8.3)
Total Bilirubin: 0.4 mg/dL (ref 0.2–1.2)

## 2018-05-05 LAB — HEMOGLOBIN A1C: HEMOGLOBIN A1C: 5.2 % (ref 4.6–6.5)

## 2018-05-05 LAB — TSH

## 2018-05-05 LAB — LIPID PANEL
Cholesterol: 284 mg/dL — ABNORMAL HIGH (ref 0–200)
HDL: 70.6 mg/dL (ref >39.00)
LDL CALC: 200 mg/dL — AB (ref 0–99)
NonHDL: 213.79
TRIGLYCERIDES: 69 mg/dL (ref 0.0–149.0)
Total CHOL/HDL Ratio: 4
VLDL: 13.8 mg/dL (ref 0.0–40.0)

## 2018-05-06 ENCOUNTER — Encounter: Payer: Self-pay | Admitting: Family Medicine

## 2018-05-11 ENCOUNTER — Telehealth: Payer: Self-pay | Admitting: Family Medicine

## 2018-05-11 DIAGNOSIS — E039 Hypothyroidism, unspecified: Secondary | ICD-10-CM

## 2018-05-11 DIAGNOSIS — E782 Mixed hyperlipidemia: Secondary | ICD-10-CM

## 2018-05-11 NOTE — Telephone Encounter (Signed)
Patient states she was unable to log on to mychart, I called patient and explained to her the lab results. She states she did run out of her levothyroxine for a while but has recently started taking it again. She never ran out of her cholesterol medication and has been taking it regularly. Please advise.

## 2018-05-11 NOTE — Telephone Encounter (Signed)
Copied from [place] [id number]. Topic: Quick Communication - See Telephone Encounter >> May 11, 2018  1:53 PM Cleaster Corin, NT wrote: CRM for notification. See Telephone encounter for: 05/11/18.  Pt. Calling to receive lab results could not access my chart pt. Would like to be called at [phone]

## 2018-05-12 MED ORDER — ROSUVASTATIN CALCIUM 10 MG PO TABS: 10.0000 mg | ORAL_TABLET | Freq: Every day | ORAL | 3 refills | Status: DC

## 2018-05-12 NOTE — Telephone Encounter (Signed)
Results for orders placed or performed in visit on 05/04/18  Comprehensive metabolic panel  Result Value Ref Range   Sodium 139 135 - 145 mEq/L   Potassium 4.0 3.5 - 5.1 mEq/L   Chloride 106 96 - 112 mEq/L   CO2 25 19 - 32 mEq/L   Glucose, Bld 91 70 - 99 mg/dL   BUN 26 (H) 6 - 23 mg/dL   Creatinine, Ser 1.78 (H) 0.40 - 1.20 mg/dL   Total Bilirubin 0.4 0.2 - 1.2 mg/dL   Alkaline Phosphatase 79 39 - 117 U/L   AST 14 0 - 37 U/L   ALT 8 0 - 35 U/L   Total Protein 7.3 6.0 - 8.3 g/dL   Albumin 3.9 3.5 - 5.2 g/dL   Calcium 9.4 8.4 - 10.5 mg/dL   GFR 35.30 (L) >60.00 mL/min  TSH  Result Value Ref Range   TSH 81.50 Repeated and verified X2. (H) 0.35 - 4.50 uIU/mL  Lipid panel  Result Value Ref Range   Cholesterol 284 (H) 0 - 200 mg/dL   Triglycerides 69.0 0.0 - 149.0 mg/dL   HDL 70.60 >39.00 mg/dL   VLDL 13.8 0.0 - 40.0 mg/dL   LDL Cholesterol 200 (H) 0 - 99 mg/dL   Total CHOL/HDL Ratio 4    NonHDL 213.79   Hemoglobin A1c  Result Value Ref Range   Hgb A1c MFr Bld 5.2 4.6 - 6.5 %

## 2018-05-12 NOTE — Telephone Encounter (Signed)
Called pt and was able to speak with her  She is taking her thyroid med again now, will come in for a TSH check only in about one month Her creat is about at baseline.  She had not understood that she needed to follow-up with Dr. Florene Glen at nephrology but will do so Creat clearance is 48, will change from lovastatin to crestor 10

## 2018-05-21 ENCOUNTER — Other Ambulatory Visit: Payer: Self-pay | Admitting: Family Medicine

## 2018-08-22 ENCOUNTER — Other Ambulatory Visit: Payer: Self-pay

## 2018-08-22 ENCOUNTER — Observation Stay (HOSPITAL_COMMUNITY)
Admission: EM | Admit: 2018-08-22 | Discharge: 2018-08-23 | Disposition: A | Discharge status: Home / Self Care | Payer: Medicare HMO | Attending: Internal Medicine | Admitting: Internal Medicine

## 2018-08-22 ENCOUNTER — Emergency Department (HOSPITAL_COMMUNITY): Payer: Medicare HMO

## 2018-08-22 ENCOUNTER — Encounter (HOSPITAL_COMMUNITY): Payer: Self-pay | Admitting: *Deleted

## 2018-08-22 DIAGNOSIS — N183 Chronic kidney disease, stage 3 unspecified: Secondary | ICD-10-CM | POA: Diagnosis present

## 2018-08-22 DIAGNOSIS — I4581 Long QT syndrome: Secondary | ICD-10-CM | POA: Diagnosis not present

## 2018-08-22 DIAGNOSIS — Z66 Do not resuscitate: Secondary | ICD-10-CM | POA: Insufficient documentation

## 2018-08-22 DIAGNOSIS — Z7982 Long term (current) use of aspirin: Secondary | ICD-10-CM | POA: Insufficient documentation

## 2018-08-22 DIAGNOSIS — G4730 Sleep apnea, unspecified: Secondary | ICD-10-CM | POA: Insufficient documentation

## 2018-08-22 DIAGNOSIS — D631 Anemia in chronic kidney disease: Secondary | ICD-10-CM | POA: Diagnosis not present

## 2018-08-22 DIAGNOSIS — R079 Chest pain, unspecified: Secondary | ICD-10-CM | POA: Diagnosis not present

## 2018-08-22 DIAGNOSIS — R0789 Other chest pain: Principal | ICD-10-CM | POA: Insufficient documentation

## 2018-08-22 DIAGNOSIS — Z79890 Hormone replacement therapy (postmenopausal): Secondary | ICD-10-CM | POA: Diagnosis not present

## 2018-08-22 DIAGNOSIS — I10 Essential (primary) hypertension: Secondary | ICD-10-CM

## 2018-08-22 DIAGNOSIS — E039 Hypothyroidism, unspecified: Secondary | ICD-10-CM | POA: Diagnosis not present

## 2018-08-22 DIAGNOSIS — R002 Palpitations: Secondary | ICD-10-CM | POA: Diagnosis not present

## 2018-08-22 DIAGNOSIS — E785 Hyperlipidemia, unspecified: Secondary | ICD-10-CM | POA: Diagnosis not present

## 2018-08-22 DIAGNOSIS — Z79899 Other long term (current) drug therapy: Secondary | ICD-10-CM | POA: Diagnosis not present

## 2018-08-22 DIAGNOSIS — E0590 Thyrotoxicosis, unspecified without thyrotoxic crisis or storm: Secondary | ICD-10-CM | POA: Diagnosis not present

## 2018-08-22 DIAGNOSIS — I499 Cardiac arrhythmia, unspecified: Secondary | ICD-10-CM | POA: Diagnosis not present

## 2018-08-22 DIAGNOSIS — I701 Atherosclerosis of renal artery: Secondary | ICD-10-CM | POA: Diagnosis not present

## 2018-08-22 DIAGNOSIS — I160 Hypertensive urgency: Secondary | ICD-10-CM | POA: Diagnosis not present

## 2018-08-22 DIAGNOSIS — R011 Cardiac murmur, unspecified: Secondary | ICD-10-CM | POA: Insufficient documentation

## 2018-08-22 DIAGNOSIS — K219 Gastro-esophageal reflux disease without esophagitis: Secondary | ICD-10-CM | POA: Diagnosis present

## 2018-08-22 DIAGNOSIS — I129 Hypertensive chronic kidney disease with stage 1 through stage 4 chronic kidney disease, or unspecified chronic kidney disease: Secondary | ICD-10-CM | POA: Diagnosis not present

## 2018-08-22 LAB — TROPONIN I
Troponin I: 0.03 ng/mL (ref <0.03)
Troponin I: 0.03 ng/mL (ref <0.03)

## 2018-08-22 LAB — BASIC METABOLIC PANEL
Anion gap: 11 (ref 5–15)
BUN: 25 mg/dL — AB (ref 8–23)
CALCIUM: 9.4 mg/dL (ref 8.9–10.3)
CO2: 23 mmol/L (ref 22–32)
CREATININE: 1.54 mg/dL — AB (ref 0.44–1.00)
Chloride: 109 mmol/L (ref 98–111)
GFR calc Af Amer: 36 mL/min — ABNORMAL LOW (ref >60)
GFR, EST NON AFRICAN AMERICAN: 31 mL/min — AB (ref >60)
Glucose, Bld: 139 mg/dL — ABNORMAL HIGH (ref 70–99)
Potassium: 3.8 mmol/L (ref 3.5–5.1)
SODIUM: 143 mmol/L (ref 135–145)

## 2018-08-22 LAB — CBC
HCT: 35.4 % — ABNORMAL LOW (ref 36.0–46.0)
Hemoglobin: 10.9 g/dL — ABNORMAL LOW (ref 12.0–15.0)
MCH: 25.9 pg — ABNORMAL LOW (ref 26.0–34.0)
MCHC: 30.8 g/dL (ref 30.0–36.0)
MCV: 84.1 fL (ref 78.0–100.0)
PLATELETS: 330 10*3/uL (ref 150–400)
RBC: 4.21 MIL/uL (ref 3.87–5.11)
RDW: 13.2 % (ref 11.5–15.5)
WBC: 7.4 10*3/uL (ref 4.0–10.5)

## 2018-08-22 LAB — BRAIN NATRIURETIC PEPTIDE: B Natriuretic Peptide: 237 pg/mL — ABNORMAL HIGH (ref 0.0–100.0)

## 2018-08-22 LAB — T4, FREE: FREE T4: 1.75 ng/dL (ref 0.82–1.77)

## 2018-08-22 LAB — MAGNESIUM: MAGNESIUM: 2 mg/dL (ref 1.7–2.4)

## 2018-08-22 LAB — I-STAT TROPONIN, ED: TROPONIN I, POC: 0.01 ng/mL (ref 0.00–0.08)

## 2018-08-22 LAB — TSH: TSH: 0.088 u[IU]/mL — ABNORMAL LOW (ref 0.350–4.500)

## 2018-08-22 MED ORDER — ROSUVASTATIN CALCIUM 10 MG PO TABS
10.0000 mg | ORAL_TABLET | Freq: Every day | ORAL | Status: DC
  Administered 2018-08-22: 10 mg via ORAL
  Filled 2018-08-22 (×2): qty 1

## 2018-08-22 MED ORDER — HYDRALAZINE HCL 25 MG PO TABS: 50.0000 mg | ORAL_TABLET | Freq: Three times a day (TID) | ORAL | Status: DC

## 2018-08-22 MED ORDER — HYDRALAZINE HCL 20 MG/ML IJ SOLN
5.0000 mg | Freq: Once | INTRAMUSCULAR | Status: AC
  Administered 2018-08-22: 5 mg via INTRAVENOUS
  Filled 2018-08-22: qty 1

## 2018-08-22 MED ORDER — LOSARTAN POTASSIUM 50 MG PO TABS
50.0000 mg | ORAL_TABLET | Freq: Every day | ORAL | Status: DC
  Administered 2018-08-22 – 2018-08-23 (×2): 50 mg via ORAL
  Filled 2018-08-22 (×2): qty 1

## 2018-08-22 MED ORDER — FAMOTIDINE 20 MG PO TABS
20.0000 mg | ORAL_TABLET | Freq: Every day | ORAL | Status: DC
  Administered 2018-08-23: 20 mg via ORAL
  Filled 2018-08-22: qty 1

## 2018-08-22 MED ORDER — HYDRALAZINE HCL 50 MG PO TABS
100.0000 mg | ORAL_TABLET | Freq: Three times a day (TID) | ORAL | Status: DC
  Administered 2018-08-22 – 2018-08-23 (×2): 100 mg via ORAL
  Filled 2018-08-22 (×2): qty 2

## 2018-08-22 MED ORDER — HEPARIN SODIUM (PORCINE) 5000 UNIT/ML IJ SOLN
5000.0000 [IU] | Freq: Three times a day (TID) | INTRAMUSCULAR | Status: DC
  Administered 2018-08-22 – 2018-08-23 (×2): 5000 [IU] via SUBCUTANEOUS
  Filled 2018-08-22 (×2): qty 1

## 2018-08-22 MED ORDER — NITROGLYCERIN 0.4 MG SL SUBL
0.4000 mg | SUBLINGUAL_TABLET | SUBLINGUAL | Status: DC | PRN
  Administered 2018-08-23: 0.4 mg via SUBLINGUAL
  Filled 2018-08-22: qty 1

## 2018-08-22 MED ORDER — AMLODIPINE BESYLATE 10 MG PO TABS
10.0000 mg | ORAL_TABLET | Freq: Every day | ORAL | Status: DC
  Administered 2018-08-23: 10 mg via ORAL
  Filled 2018-08-22: qty 1

## 2018-08-22 MED ORDER — ASPIRIN 81 MG PO CHEW
81.0000 mg | CHEWABLE_TABLET | Freq: Every day | ORAL | Status: DC
  Administered 2018-08-23: 81 mg via ORAL
  Filled 2018-08-22: qty 1

## 2018-08-22 MED ORDER — BLISTEX MEDICATED EX OINT
TOPICAL_OINTMENT | CUTANEOUS | Status: DC | PRN
  Administered 2018-08-22: 22:00:00 via TOPICAL
  Filled 2018-08-22: qty 6.3

## 2018-08-22 MED ORDER — HYDRALAZINE HCL 20 MG/ML IJ SOLN
10.0000 mg | Freq: Four times a day (QID) | INTRAMUSCULAR | Status: DC | PRN
  Administered 2018-08-22: 10 mg via INTRAVENOUS
  Filled 2018-08-22: qty 1

## 2018-08-22 MED ORDER — LEVOTHYROXINE SODIUM 200 MCG PO TABS
200.0000 ug | ORAL_TABLET | Freq: Every day | ORAL | Status: DC
  Filled 2018-08-22: qty 2
  Filled 2018-08-22: qty 1

## 2018-08-22 NOTE — ED Notes (Addendum)
Pt ambulated to the bathroom with assistance. Pt felt weak and short of breath walking to bathroom and back to bed. Pt states "I think I'm dying" Reassured pt that we are taking care of her. Pt's BP is not 224/83 HR is 76

## 2018-08-22 NOTE — ED Triage Notes (Addendum)
Pt here from home via [facility] c/o chest pain/epigastric pain/ palpitations that began while she was laying in bed.  PT took 650 mg regular asa and 2 nitro prior to ems arrival and was given 4 81 mg asa and 2 nitro from ems.  Nitro relieved pain from 9/10 to 4/10.  Initial pressure slowly dropped from 186/102 to 154/94.  18 L ac. Hr 78, rr 16, O2 sats 96% RA.

## 2018-08-22 NOTE — ED Notes (Signed)
Attempted report.  RN on 6E feels pt is appropriate for 5C.  Agricultural consultant notified.

## 2018-08-22 NOTE — ED Provider Notes (Signed)
[facility] EMERGENCY DEPARTMENT Provider Note   CSN: [id number] Arrival date & time: 08/22/18  1442     History   Chief Complaint Chief Complaint  Patient presents with  . Chest Pain    HPI TOIA MICALE is a 82 y.o. female.  HPI   82 year old female with past medical history as below here with palpitations.  Patient states that for the last 1 to 2 weeks, she has had intermittent episodes in which she feels like her heart starts beating rapidly and irregularly.  She has associated occasional shortness of breath.  Earlier today, the patient felt well when she woke up and around 10-11 o'clock.  She was walking to the other room when she developed irregular palpitations.  She had associated dull, aching substernal chest pressure. She states that she took aspirin, and called EMS.  EMS gave her aspirin as well as nitroglycerin, which did not change her symptoms.  She states she currently feels back to her baseline.  She states she had a history of atrial fibrillation in the past, but this was in the setting of thyroid disorder.  Patient has not had any recent bleeding.  Recent medication changes.  No known history of coronary disease.  Denies any current symptoms currently. She felt like heart was beating hard and irregularly.  Past Medical History:  Diagnosis Date  . Arthritis    "minor in my shoulders" (02/21/2016)  . Chicken pox   . Diverticulitis   . Frequent headaches    "maybe twice/week" (03/12/2016)  . Gout    Right Foot  . History of blood transfusion    "said my blood was low"  . History of gout   . Hyperlipidemia   . Hypertension   . Hypothyroidism   . Migraine    "maybe twice/year" (02/21/2016)  . Palpitations    "doctor thought it was related to my thyroid"  . Sleep apnea    "I don't wear my mask" (02/21/2016)    Patient Active Problem List   Diagnosis Date Noted  . Sleep apnea   . Palpitations   . Migraine   . Hyperlipidemia   . History  of gout   . History of blood transfusion   . Frequent headaches   . Diverticulitis   . Chicken pox   . Arthritis   . CKD (chronic kidney disease) stage 3, GFR 30-59 ml/min (HCC) 02/23/2017  . Age-related osteoporosis without current pathological fracture 02/23/2017  . Esophageal reflux 05/08/2016  . Renal artery stenosis ([place]) 02/22/2016  . Hypothyroidism 02/22/2016  . Chest pain with high risk for cardiac etiology 02/21/2016  . Gout   . SOB (shortness of breath) on exertion 10/06/2015  . Accelerated hypertension 10/06/2015  . Essential hypertension, benign 11/26/2013  . Hyperlipidemia LDL goal <100 11/26/2013    Past Surgical History:  Procedure Laterality Date  . ABDOMINAL HYSTERECTOMY     "for fibroid tumors"  . TUBAL LIGATION       OB History   None      Home Medications    Prior to Admission medications   Medication Sig Start Date End Date Taking? Authorizing Provider  acetaminophen (TYLENOL) 325 MG tablet Take 650 mg by mouth every 6 (six) hours as needed (pain).     [provider]  amLODipine (NORVASC) 10 MG tablet Take 1 tablet (10 mg total) by mouth daily. 05/04/18   Copland, Gay Filler, MD  aspirin EC 81 MG EC tablet Take 1 tablet (  81 mg total) by mouth daily. 02/22/16   Brett Canales, PA-C  hydrALAZINE (APRESOLINE) 50 MG tablet Take 1 tablet (50 mg total) by mouth 3 (three) times daily. 05/04/18   Copland, Gay Filler, MD  levothyroxine (SYNTHROID, LEVOTHROID) 200 MCG tablet Take 1 daily 05/04/18   Copland, Gay Filler, MD  losartan (COZAAR) 50 MG tablet Take 1 tablet (50 mg total) by mouth daily. 05/04/18   Copland, Gay Filler, MD  nitroGLYCERIN (NITROSTAT) 0.4 MG SL tablet Place 1 tablet (0.4 mg total) under the tongue every 5 (five) minutes x 3 doses as needed for chest pain. 11/05/16   Belva Crome, MD  OVER THE COUNTER MEDICATION Place 1 drop into both eyes daily as needed (dry eyes). Over the counter lubricating eye drops    [provider]    ranitidine (ZANTAC) 300 MG tablet Take 0.5 tablets (150 mg total) by mouth 2 (two) times daily. 05/04/18   Copland, Gay Filler, MD  rosuvastatin (CRESTOR) 10 MG tablet Take 1 tablet (10 mg total) by mouth daily. 05/12/18   Copland, Gay Filler, MD    Family History Family History  Problem Relation Age of Onset  . Heart disease Mother   . Heart attack Mother        died from it  . Hypertension Mother   . Arthritis Mother   . Stroke Father   . Cancer Maternal Grandmother   . Heart attack Maternal Aunt   . Renal Disease Maternal Aunt   . Multiple myeloma Maternal Uncle   . Emphysema Maternal Uncle   . Heart disease Sister   . Thyroid disease Sister   . Healthy Son        x3  . Healthy Daughter        x5    Social History Social History   Tobacco Use  . Smoking status: Never Smoker  . Smokeless tobacco: Never Used  Substance Use Topics  . Alcohol use: No  . Drug use: No     Allergies   Pineapple   Review of Systems Review of Systems  Constitutional: Positive for fatigue. Negative for chills and fever.  HENT: Negative for congestion and rhinorrhea.   Eyes: Negative for visual disturbance.  Respiratory: Negative for cough, shortness of breath and wheezing.   Cardiovascular: Positive for palpitations. Negative for chest pain and leg swelling.  Gastrointestinal: Negative for abdominal pain, diarrhea, nausea and vomiting.  Genitourinary: Negative for dysuria and flank pain.  Musculoskeletal: Negative for neck pain and neck stiffness.  Skin: Negative for rash and wound.  Allergic/Immunologic: Negative for immunocompromised state.  Neurological: Positive for weakness. Negative for syncope and headaches.  All other systems reviewed and are negative.    Physical Exam Updated Vital Signs BP (!) 188/67 (BP Location: Left Arm)   Pulse 76   Temp 98.2 F (36.8 C) (Oral)   Resp (!) 24   Ht _0  (1.6 m)   Wt 78.1 kg   SpO2 98%   BMI 30.50 kg/m   Physical Exam   Constitutional: She is oriented to person, place, and time. She appears well-developed and well-nourished. No distress.  HENT:  Head: Normocephalic and atraumatic.  Eyes: Conjunctivae are normal.  Neck: Neck supple.  Cardiovascular: Normal rate and regular rhythm. Exam reveals no friction rub.  Murmur heard.  Systolic murmur is present with a grade of 2/6. Pulmonary/Chest: Effort normal and breath sounds normal. No respiratory distress. She has no wheezes. She has no rales.  Abdominal: She exhibits no distension.  Musculoskeletal: She exhibits no edema.  Neurological: She is alert and oriented to person, place, and time. She exhibits normal muscle tone.  Skin: Skin is warm. Capillary refill takes less than 2 seconds.  Psychiatric: She has a normal mood and affect.  Nursing note and vitals reviewed.    ED Treatments / Results  Labs (all labs ordered are listed, but only abnormal results are displayed) Labs Reviewed  BASIC METABOLIC PANEL - Abnormal; Notable for the following components:      Result Value   Glucose, Bld 139 (*)    BUN 25 (*)    Creatinine, Ser 1.54 (*)    GFR calc non Af Amer 31 (*)    GFR calc Af Amer 36 (*)    All other components within normal limits  CBC - Abnormal; Notable for the following components:   Hemoglobin 10.9 (*)    HCT 35.4 (*)    MCH 25.9 (*)    All other components within normal limits  MAGNESIUM  BRAIN NATRIURETIC PEPTIDE  TROPONIN I  TSH  T4, FREE  I-STAT TROPONIN, ED    EKG EKG Interpretation  Date/Time:  Monday August 22 2018 15:11:09 EDT Ventricular Rate:  75 PR Interval:  154 QRS Duration: 128 QT Interval:  456 QTC Calculation: 509 R Axis:   -4 Text Interpretation:  Normal sinus rhythm Right bundle branch block Left ventricular hypertrophy Cannot rule out Septal infarct , age undetermined T wave abnormality, consider inferior ischemia Abnormal ECG No significant change since last tracing Significant artifact Confirmed  by Duffy Bruce [phone]) on 08/22/2018 3:31:05 PM   Radiology Dg Chest 2 View  Result Date: 08/22/2018 CLINICAL DATA:  82 year old female with history of chest pain and epigastric pain. EXAM: CHEST - 2 VIEW COMPARISON:  Chest x-ray 07/01/2016. FINDINGS: Lung volumes are normal. No consolidative airspace disease. No pleural effusions. No pneumothorax. No pulmonary nodule or mass noted. Pulmonary vasculature and the cardiomediastinal silhouette are within normal limits. Atherosclerosis in the thoracic aorta. IMPRESSION: 1.  No radiographic evidence of acute cardiopulmonary disease. Electronically Signed   By: Vinnie Langton M.D.   On: 08/22/2018 15:38    Procedures Procedures (including critical care time)  Medications Ordered in ED Medications - No data to display   Initial Impression / Assessment and Plan / ED Course  I have reviewed the triage vital signs and the nursing notes.  Pertinent labs & imaging results that were available during my care of the patient were reviewed by me and considered in my medical decision making (see chart for details).     82 year old female with extensive past medical history as above here with chest pain and palpitations.  Clinically, concern for possible atrial fibrillation with demand related ischemia, though cannot rule out primary ACS.  Initial EKG nonischemic and troponin is negative.  Chest x-ray is clear.  Labs are otherwise reassuring.  Given her risk factors, she is a high risk heart score and will admit for observation and chest pain rule out. Of note, tp also with +murmur on exam, so may benefit from valvular evaluation.  Final Clinical Impressions(s) / ED Diagnoses   Final diagnoses:  Atypical chest pain    ED Discharge Orders    None       Duffy Bruce, MD 08/22/18 [phone]

## 2018-08-22 NOTE — ED Notes (Signed)
Admitting MD at bedside.

## 2018-08-22 NOTE — H&P (Addendum)
History and Physical  Carolyn Myers HER:[id number] DOB: Apr 04, 1936 DOA: 08/22/2018  Referring physician: Dr Ellender Hose  PCP: Lorelei Pont Gay Filler, MD  Outpatient Specialists: None Patient coming from: Home  Chief Complaint: Chest pain   HPI: Carolyn Myers is a 82 y.o. female with medical history significant for labile hypertension, hyperlipidemia, hypothyroidism, who presented to the ED at [facility] with complaints of moderate to severe chest pain, dull, substernal and nonradiating.  Started around 10 AM this morning, at rest, associated with palpitations.  No dyspnea.  Onset of palpitations about a week ago and have been intermittent.  No recent lengthy trips.  At the time of this visit she was chest pain-free.  Reports compliance with her medications. Follows up with her primary care provider twice a year.  Denies any GI symptoms.  ED Course: Twelve-lead EKG revealed sinus rhythm with rate of 72 and QTC prolongation 512 no acute or specific ST-T changes.  First set of troponin negative.  Accelerated hypertension on presentation with systolic blood pressure greater than 200.  Admitted for chest pain rule out ACS to the telemetry unit as observation status.  Review of Systems: Review of systems as noted in the HPI. All other systems reviewed and are negative.   Past Medical History:  Diagnosis Date  . Arthritis    "minor in my shoulders" (02/21/2016)  . Chicken pox   . Diverticulitis   . Frequent headaches    "maybe twice/week" (03/12/2016)  . Gout    Right Foot  . History of blood transfusion    "said my blood was low"  . History of gout   . Hyperlipidemia   . Hypertension   . Hypothyroidism   . Migraine    "maybe twice/year" (02/21/2016)  . Palpitations    "doctor thought it was related to my thyroid"  . Sleep apnea    "I don't wear my mask" (02/21/2016)   Past Surgical History:  Procedure Laterality Date  . ABDOMINAL HYSTERECTOMY     "for fibroid tumors"  . TUBAL LIGATION       Social History:  reports that she has never smoked. She has never used smokeless tobacco. She reports that she does not drink alcohol or use drugs.   Allergies  Allergen Reactions  . Pineapple Swelling    Reaction to fresh pineapple - tongue swells, but breathing is not affected    Family History  Problem Relation Age of Onset  . Heart disease Mother   . Heart attack Mother        died from it  . Hypertension Mother   . Arthritis Mother   . Stroke Father   . Cancer Maternal Grandmother   . Heart attack Maternal Aunt   . Renal Disease Maternal Aunt   . Multiple myeloma Maternal Uncle   . Emphysema Maternal Uncle   . Heart disease Sister   . Thyroid disease Sister   . Healthy Son        x3  . Healthy Daughter        x5     Prior to Admission medications   Medication Sig Start Date End Date Taking? Authorizing Provider  acetaminophen (TYLENOL) 325 MG tablet Take 650 mg by mouth every 6 (six) hours as needed (for pain or headaches).    Yes [provider]  amLODipine (NORVASC) 10 MG tablet Take 1 tablet (10 mg total) by mouth daily. 05/04/18  Yes Copland, Gay Filler, MD  aspirin EC 81 MG EC tablet  Take 1 tablet (81 mg total) by mouth daily. 02/22/16  Yes Brett Canales, PA-C  Carboxymethylcellulose Sodium ([employer] LUBRICANT EYE DROPS PF OP) Place 1-2 drops into both eyes 3 (three) times daily as needed (for dry eyes).   Yes [provider]  hydrALAZINE (APRESOLINE) 50 MG tablet Take 1 tablet (50 mg total) by mouth 3 (three) times daily. 05/04/18  Yes Copland, Gay Filler, MD  levothyroxine (SYNTHROID, LEVOTHROID) 200 MCG tablet Take 1 daily Patient taking differently: Take 200 mcg by mouth daily before breakfast. Take 1 daily 05/04/18  Yes Copland, Gay Filler, MD  losartan (COZAAR) 50 MG tablet Take 1 tablet (50 mg total) by mouth daily. 05/04/18  Yes Copland, Gay Filler, MD  nitroGLYCERIN (NITROSTAT) 0.4 MG SL tablet Place 1 tablet (0.4 mg total) under the tongue every 5  (five) minutes x 3 doses as needed for chest pain. 11/05/16  Yes Belva Crome, MD  ranitidine (ZANTAC) 300 MG tablet Take 0.5 tablets (150 mg total) by mouth 2 (two) times daily. 05/04/18  Yes Copland, Gay Filler, MD  rosuvastatin (CRESTOR) 10 MG tablet Take 1 tablet (10 mg total) by mouth daily. Patient taking differently: Take 10 mg by mouth at bedtime.  05/12/18  Yes Copland, Gay Filler, MD    Physical Exam: BP (!) 196/74 (BP Location: Right Arm)   Pulse 79   Temp 98.2 F (36.8 C) (Oral)   Resp (!) 26   Ht _0  (1.6 m)   Wt 78.1 kg   SpO2 100%   BMI 30.50 kg/m   . General: 82 y.o. year-old female well developed well nourished in no acute distress.  Alert and oriented x3. . Cardiovascular: Regular rate and rhythm with no rubs or gallops.  Grade 3 out of 6 systolic murmur.  No thyromegaly or JVD noted.  No lower extremity edema. 2/4 pulses in all 4 extremities. Marland Kitchen Respiratory: Clear to auscultation with no wheezes or rales. Good inspiratory effort. . Abdomen: Soft nontender nondistended with normal bowel sounds x4 quadrants. . Muskuloskeletal: No cyanosis, clubbing or edema noted bilaterally . Neuro: CN II-XII intact, strength, sensation, reflexes . Skin: No ulcerative lesions noted or rashes . Psychiatry: Judgement and insight appear normal. Mood is appropriate for condition and setting          Labs on Admission:  Basic Metabolic Panel: Recent Labs  Lab 08/22/18 1528 08/22/18 1635  NA 143  --   K 3.8  --   CL 109  --   CO2 23  --   GLUCOSE 139*  --   BUN 25*  --   CREATININE 1.54*  --   CALCIUM 9.4  --   MG  --  2.0   Liver Function Tests: No results for input(s): AST, ALT, ALKPHOS, BILITOT, PROT, ALBUMIN in the last 168 hours. No results for input(s): LIPASE, AMYLASE in the last 168 hours. No results for input(s): AMMONIA in the last 168 hours. CBC: Recent Labs  Lab 08/22/18 1528  WBC 7.4  HGB 10.9*  HCT 35.4*  MCV 84.1  PLT 330   Cardiac Enzymes: Recent  Labs  Lab 08/22/18 1635  TROPONINI 0.03*    BNP (last 3 results) Recent Labs    08/22/18 1635  BNP 237.0*    ProBNP (last 3 results) No results for input(s): PROBNP in the last 8760 hours.  CBG: No results for input(s): GLUCAP in the last 168 hours.  Radiological Exams on Admission: Dg Chest 2 View  Result  Date: 08/22/2018 CLINICAL DATA:  82 year old female with history of chest pain and epigastric pain. EXAM: CHEST - 2 VIEW COMPARISON:  Chest x-ray 07/01/2016. FINDINGS: Lung volumes are normal. No consolidative airspace disease. No pleural effusions. No pneumothorax. No pulmonary nodule or mass noted. Pulmonary vasculature and the cardiomediastinal silhouette are within normal limits. Atherosclerosis in the thoracic aorta. IMPRESSION: 1.  No radiographic evidence of acute cardiopulmonary disease. Electronically Signed   By: Vinnie Langton M.D.   On: 08/22/2018 15:38    EKG: I independently viewed the EKG done and my findings are as followed: Sinus rhythm with rate of 72.  No acute or specific ST T changes.  Prolonged QTC 512.  Assessment/Plan Present on Admission: . Chest pain  Active Problems:   Chest pain  Chest pain rule out ACS Twelve-lead EKG unremarkable for any acute or specific ST-T changes First set troponin negative Cycle troponin x3 Obtain TSH Repeat EKG in the morning Monitor on telemetry overnight  Accelerated hypertension In the setting of uncontrolled hypertension Resume home medications which include amlodipine 10 mg daily, p.o. hydralazine 50 mg 3 times daily, losartan 50 mg daily IV hydralazine 10 mg every 6 hours PRN for systolic blood pressure greater than 013 or diastolic greater than 143 Continue to monitor vital signs Increase p.o. hydralazine dose 100 mg 3 times daily Continue to monitor vital signs  Grade 3 out of 6 systolic murmur Patient unaware of this murmur Obtain 2D echo If 2D echo is abnormal, consult cardiology  QTC  prolongation QTC 512 Repeat twelve-lead EKG in the morning Avoid QTC prolonging agents  Hyperlipidemia Continue Crestor  Hypothyroidism Resume levothyroxine Obtain TSH  CKD 3 Appears to be at her baseline creatinine 1.5 with GFR of 36 Avoid nephrotoxic agents/dehydration/hypotension Monitor urine output Repeat BMP in the morning  Anemia of chronic disease/normocytic anemia Appears to be at her baseline hemoglobin 10.9 MCV 84 No sign of overt bleeding  Risk: Moderate to severe due to present condition, advanced age and multiple comorbidities.   DVT prophylaxis: Subcu heparin 5000 3 times daily  Code Status: DNR  Family Communication: Daughter and son at bedside  Disposition Plan: Admit to telemetry unit  Consults called: None  Admission status: Observation status    Kayleen Memos MD [facility] Pager [phone]  If 7PM-7AM, please contact night-coverage [email or web address] Password [facility]  08/22/2018, 6:05 PM

## 2018-08-22 NOTE — Progress Notes (Signed)
Patient resting quietly with no complaints at this time. Will continue to monitor.

## 2018-08-23 ENCOUNTER — Other Ambulatory Visit: Payer: Self-pay | Admitting: Physician Assistant

## 2018-08-23 ENCOUNTER — Ambulatory Visit (HOSPITAL_BASED_OUTPATIENT_CLINIC_OR_DEPARTMENT_OTHER): Payer: Medicare HMO

## 2018-08-23 ENCOUNTER — Telehealth (HOSPITAL_COMMUNITY): Payer: Self-pay | Admitting: *Deleted

## 2018-08-23 DIAGNOSIS — K219 Gastro-esophageal reflux disease without esophagitis: Secondary | ICD-10-CM | POA: Diagnosis not present

## 2018-08-23 DIAGNOSIS — I5030 Unspecified diastolic (congestive) heart failure: Secondary | ICD-10-CM

## 2018-08-23 DIAGNOSIS — R0789 Other chest pain: Secondary | ICD-10-CM | POA: Diagnosis not present

## 2018-08-23 DIAGNOSIS — I10 Essential (primary) hypertension: Secondary | ICD-10-CM | POA: Diagnosis not present

## 2018-08-23 DIAGNOSIS — I160 Hypertensive urgency: Secondary | ICD-10-CM

## 2018-08-23 DIAGNOSIS — R079 Chest pain, unspecified: Secondary | ICD-10-CM

## 2018-08-23 DIAGNOSIS — R002 Palpitations: Secondary | ICD-10-CM

## 2018-08-23 DIAGNOSIS — R7989 Other specified abnormal findings of blood chemistry: Secondary | ICD-10-CM | POA: Diagnosis not present

## 2018-08-23 LAB — COMPREHENSIVE METABOLIC PANEL
ALK PHOS: 79 U/L (ref 38–126)
ALT: 12 U/L (ref 0–44)
ANION GAP: 8 (ref 5–15)
AST: 17 U/L (ref 15–41)
Albumin: 3.2 g/dL — ABNORMAL LOW (ref 3.5–5.0)
BILIRUBIN TOTAL: 0.8 mg/dL (ref 0.3–1.2)
BUN: 24 mg/dL — ABNORMAL HIGH (ref 8–23)
CO2: 23 mmol/L (ref 22–32)
Calcium: 8.9 mg/dL (ref 8.9–10.3)
Chloride: 112 mmol/L — ABNORMAL HIGH (ref 98–111)
Creatinine, Ser: 1.5 mg/dL — ABNORMAL HIGH (ref 0.44–1.00)
GFR, EST AFRICAN AMERICAN: 37 mL/min — AB (ref >60)
GFR, EST NON AFRICAN AMERICAN: 32 mL/min — AB (ref >60)
Glucose, Bld: 94 mg/dL (ref 70–99)
Potassium: 3.6 mmol/L (ref 3.5–5.1)
SODIUM: 143 mmol/L (ref 135–145)
TOTAL PROTEIN: 6.3 g/dL — AB (ref 6.5–8.1)

## 2018-08-23 LAB — CBC
HCT: 30.7 % — ABNORMAL LOW (ref 36.0–46.0)
Hemoglobin: 9.9 g/dL — ABNORMAL LOW (ref 12.0–15.0)
MCH: 26.5 pg (ref 26.0–34.0)
MCHC: 32.2 g/dL (ref 30.0–36.0)
MCV: 82.1 fL (ref 78.0–100.0)
Platelets: 286 10*3/uL (ref 150–400)
RBC: 3.74 MIL/uL — ABNORMAL LOW (ref 3.87–5.11)
RDW: 13.2 % (ref 11.5–15.5)
WBC: 7.3 10*3/uL (ref 4.0–10.5)

## 2018-08-23 LAB — TROPONIN I
TROPONIN I: 0.03 ng/mL — AB (ref <0.03)
Troponin I: 0.04 ng/mL (ref <0.03)

## 2018-08-23 LAB — ECHOCARDIOGRAM COMPLETE
HEIGHTINCHES: 63 in
WEIGHTICAEL: 2684.32 [oz_av]

## 2018-08-23 LAB — MAGNESIUM: MAGNESIUM: 2.1 mg/dL (ref 1.7–2.4)

## 2018-08-23 MED ORDER — GI COCKTAIL ~~LOC~~
30.0000 mL | Freq: Once | ORAL | Status: AC
  Administered 2018-08-23: 30 mL via ORAL
  Filled 2018-08-23: qty 30

## 2018-08-23 MED ORDER — HYDRALAZINE HCL 100 MG PO TABS: 100.0000 mg | ORAL_TABLET | Freq: Three times a day (TID) | ORAL | 0 refills | Status: DC

## 2018-08-23 MED ORDER — FAMOTIDINE 20 MG PO TABS: 20.0000 mg | ORAL_TABLET | Freq: Every day | ORAL | Status: DC

## 2018-08-23 NOTE — Telephone Encounter (Signed)
Left message on voicemail per DPR in reference to upcoming appointment scheduled on 08/25/18 at 0800 with detailed instructions given per Myocardial Perfusion Study Information Sheet for the test. LM to arrive 15 minutes early, and that it is imperative to arrive on time for appointment to keep from having the test rescheduled. If you need to cancel or reschedule your appointment, please call the office within 24 hours of your appointment. Failure to do so may result in a cancellation of your appointment, and a $50 no show fee. Phone number given for call back for any questions. Lex Linhares, Ranae Palms

## 2018-08-23 NOTE — Consult Note (Addendum)
Cardiology Consultation:   Patient ID: Carolyn Myers; [id number]; 18-Jun-1936   Admit date: 08/22/2018 Date of Consult: 08/23/2018  Primary Care Provider: Darreld Mclean, MD Primary Cardiologist: Sinclair Grooms, MD  Primary Electrophysiologist:     Patient Profile:   Carolyn Myers is a 82 y.o. female with a hx of HTN, HLD, renal artery stenosis, and hypothyroidism who is being seen today for the evaluation of chest pain at the request of Dr. Eliseo Squires.  History of Present Illness:   Carolyn Myers was last seen by Dr. Tamala Julian in clinic on 11/05/16. She has a history of mildly elevated troponin in the setting of hypertensive urgency. She underwent nuclear stress test that was low risk with an EF of 55% (02/22/16).  CT chest 02/21/16 with diffuse atherosclerotic disease in the aorta and visceral arteries with significant plaque in the posterior aortic arch and proximal descending thoracic aorta. She has a history of chest pain that was resolved in the ER with the GI cocktail and was not responsive to nitro (06/2016). She was unable to tolerate imdur due to headaches. She was seen by PCP 05/04/18 and chest pain was noted to be consistent with GI etiology, resolved with ranitidine.   She presented to [facility] with ongoing chest pain and palpitations. According to admitting provider, she complained of substernal severe chest pain that was nonradiating. On my interview, she denied chest pain and only complained of heart racing. She states she has heart racing twice weekly that lasts a few minutes. There are no associated symptoms and they generally resolve spontaneously. Yesterday, she felt heart racing start at approximately 10AM while watching TV. The heart racing didn't resolve, prompting her to report to the ER. She took ASA prior to EMS arriving because she thought she was having a heart attack. The ASA did not relieve her palpitations. She also received nitro which states states helped "a little."  She  states that she takes her BP at home and it generally runs in the 062B systolic. She did not take her pressure yesterday during the heart racing. She has been compliant on her medications. She was admitted to hospitalist service who increased her hydralazine to 100 mg TID. She was continued on her norvasc and losartan. She has not felt the heart racing today. We discussed ischemic evaluation and she does not wish to repeat a myoview study due to side effects of lexiscan.   Past Medical History:  Diagnosis Date  . Arthritis    "minor in my shoulders" (02/21/2016)  . Chicken pox   . Diverticulitis   . Frequent headaches    "maybe twice/week" (03/12/2016)  . Gout    Right Foot  . History of blood transfusion    "said my blood was low"  . History of gout   . Hyperlipidemia   . Hypertension   . Hypothyroidism   . Migraine    "maybe twice/year" (02/21/2016)  . Palpitations    "doctor thought it was related to my thyroid"  . Sleep apnea    "I don't wear my mask" (02/21/2016)    Past Surgical History:  Procedure Laterality Date  . ABDOMINAL HYSTERECTOMY     "for fibroid tumors"  . TUBAL LIGATION       Home Medications:  Prior to Admission medications   Medication Sig Start Date End Date Taking? Authorizing Provider  acetaminophen (TYLENOL) 325 MG tablet Take 650 mg by mouth every 6 (six) hours as needed (for pain or headaches).  Yes [provider]  amLODipine (NORVASC) 10 MG tablet Take 1 tablet (10 mg total) by mouth daily. 05/04/18  Yes Copland, Gay Filler, MD  aspirin EC 81 MG EC tablet Take 1 tablet (81 mg total) by mouth daily. 02/22/16  Yes Brett Canales, PA-C  Carboxymethylcellulose Sodium ([employer] LUBRICANT EYE DROPS PF OP) Place 1-2 drops into both eyes 3 (three) times daily as needed (for dry eyes).   Yes [provider]  hydrALAZINE (APRESOLINE) 50 MG tablet Take 1 tablet (50 mg total) by mouth 3 (three) times daily. 05/04/18  Yes Copland, Gay Filler, MD    levothyroxine (SYNTHROID, LEVOTHROID) 200 MCG tablet Take 1 daily Patient taking differently: Take 200 mcg by mouth daily before breakfast. Take 1 daily 05/04/18  Yes Copland, Gay Filler, MD  losartan (COZAAR) 50 MG tablet Take 1 tablet (50 mg total) by mouth daily. 05/04/18  Yes Copland, Gay Filler, MD  nitroGLYCERIN (NITROSTAT) 0.4 MG SL tablet Place 1 tablet (0.4 mg total) under the tongue every 5 (five) minutes x 3 doses as needed for chest pain. 11/05/16  Yes Belva Crome, MD  ranitidine (ZANTAC) 300 MG tablet Take 0.5 tablets (150 mg total) by mouth 2 (two) times daily. 05/04/18  Yes Copland, Gay Filler, MD  rosuvastatin (CRESTOR) 10 MG tablet Take 1 tablet (10 mg total) by mouth daily. Patient taking differently: Take 10 mg by mouth at bedtime.  05/12/18  Yes Copland, Gay Filler, MD    Inpatient Medications: Scheduled Meds: . amLODipine  10 mg Oral Daily  . aspirin  81 mg Oral Daily  . famotidine  20 mg Oral Daily  . heparin injection (subcutaneous)  5,000 Units Subcutaneous Q8H  . hydrALAZINE  100 mg Oral TID  . levothyroxine  200 mcg Oral QAC breakfast  . losartan  50 mg Oral Daily  . rosuvastatin  10 mg Oral Daily   Continuous Infusions:  PRN Meds: hydrALAZINE, lip balm, nitroGLYCERIN  Allergies:    Allergies  Allergen Reactions  . Pineapple Swelling    Reaction to fresh pineapple - tongue swells, but breathing is not affected    Social History:   Social History   Socioeconomic History  . Marital status: Widowed    Spouse name: Not on file  . Number of children: Not on file  . Years of education: Not on file  . Highest education level: Not on file  Occupational History  . Occupation: Retired  Scientific laboratory technician  . Financial resource strain: Not very hard  . Food insecurity:    Worry: Never true    Inability: Never true  . Transportation needs:    Medical: No    Non-medical: No  Tobacco Use  . Smoking status: Never Smoker  . Smokeless tobacco: Never Used   Substance and Sexual Activity  . Alcohol use: No  . Drug use: No  . Sexual activity: Never  Lifestyle  . Physical activity:    Days per week: 0 days    Minutes per session: 0 min  . Stress: Not at all  Relationships  . Social connections:    Talks on phone: Not on file    Gets together: Not on file    Attends religious service: Not on file    Active member of club or organization: Not on file    Attends meetings of clubs or organizations: Not on file    Relationship status: Not on file  . Intimate partner violence:    Fear of  current or ex partner: Not on file    Emotionally abused: Not on file    Physically abused: Not on file    Forced sexual activity: Not on file  Other Topics Concern  . Not on file  Social History Narrative   Lives alone, family helps in her care.    Family History:    Family History  Problem Relation Age of Onset  . Heart disease Mother   . Heart attack Mother        died from it  . Hypertension Mother   . Arthritis Mother   . Stroke Father   . Cancer Maternal Grandmother   . Heart attack Maternal Aunt   . Renal Disease Maternal Aunt   . Multiple myeloma Maternal Uncle   . Emphysema Maternal Uncle   . Heart disease Sister   . Thyroid disease Sister   . Healthy Son        x3  . Healthy Daughter        x5     ROS:  Please see the history of present illness.   All other ROS reviewed and negative.     Physical Exam/Data:   Vitals:   08/23/18 0300 08/23/18 0400 08/23/18 0500 08/23/18 0542  BP:    (!) 170/63  Pulse:    (!) 55  Resp: '17 16 16 18  '$ Temp:    98.6 F (37 C)  TempSrc:    Oral  SpO2:    99%  Weight:    76.1 kg  Height:       No intake or output data in the 24 hours ending 08/23/18 0758 Filed Weights   08/22/18 1604 08/23/18 0542  Weight: 78.1 kg 76.1 kg   Body mass index is 29.72 kg/m.  General:  Well nourished, well developed, in no acute distress HEENT: normal Neck: no JVD Vascular: No carotid  bruits Cardiac:  normal S1, S2; RRR; + murmur Lungs:  clear to auscultation bilaterally, no wheezing, rhonchi or rales  Abd: soft, nontender, no hepatomegaly  Ext: no edema Musculoskeletal:  No deformities, BUE and BLE strength normal and equal Skin: warm and dry  Neuro:  CNs 2-12 intact, no focal abnormalities noted Psych:  Normal affect   EKG:  The EKG was personally reviewed and demonstrates:  Sinus with RBBB and TWI V4/5/6 Telemetry:  Telemetry was personally reviewed and demonstrates:  Sinus, artifact  Relevant CV Studies:  Myoview 02/22/16: 1. No evidence of ischemia. Mild inferior wall attenuation without associated wall motion abnormality. 2. Mild septal hypokinesis. 3. Left ventricular ejection fraction 55% 4. Low-risk stress test findings*.   Laboratory Data:  Chemistry Recent Labs  Lab 08/22/18 1528 08/23/18 0544  NA 143 143  K 3.8 3.6  CL 109 112*  CO2 23 23  GLUCOSE 139* 94  BUN 25* 24*  CREATININE 1.54* 1.50*  CALCIUM 9.4 8.9  GFRNONAA 31* 32*  GFRAA 36* 37*  ANIONGAP 11 8    Recent Labs  Lab 08/23/18 0544  PROT 6.3*  ALBUMIN 3.2*  AST 17  ALT 12  ALKPHOS 79  BILITOT 0.8   Hematology Recent Labs  Lab 08/22/18 1528 08/23/18 0544  WBC 7.4 7.3  RBC 4.21 3.74*  HGB 10.9* 9.9*  HCT 35.4* 30.7*  MCV 84.1 82.1  MCH 25.9* 26.5  MCHC 30.8 32.2  RDW 13.2 13.2  PLT 330 286   Cardiac Enzymes Recent Labs  Lab 08/22/18 1635 08/22/18 1827 08/22/18 2342 08/23/18 0544  TROPONINI 0.03*  0.03* 0.03* 0.04*    Recent Labs  Lab 08/22/18 1530  TROPIPOC 0.01    BNP Recent Labs  Lab 08/22/18 1635  BNP 237.0*    DDimer No results for input(s): DDIMER in the last 168 hours.  Radiology/Studies:  Dg Chest 2 View  Result Date: 08/22/2018 CLINICAL DATA:  82 year old female with history of chest pain and epigastric pain. EXAM: CHEST - 2 VIEW COMPARISON:  Chest x-ray 07/01/2016. FINDINGS: Lung volumes are normal. No consolidative airspace  disease. No pleural effusions. No pneumothorax. No pulmonary nodule or mass noted. Pulmonary vasculature and the cardiomediastinal silhouette are within normal limits. Atherosclerosis in the thoracic aorta. IMPRESSION: 1.  No radiographic evidence of acute cardiopulmonary disease. Electronically Signed   By: Vinnie Langton M.D.   On: 08/22/2018 15:38    Assessment and Plan:   1. Chest pain - 0.03 --> 0.03 --> 0.04 - EKG with RBBB and TWI in V4/5/6 (both noted in 2017) - home meds: ASA and crestor 10 mg - she denies chest pain and only complains of heart racing - she does not wish to repeat a myoview - CT chest in 2017 with atherosclerotic disease - will defer CT coronary due to renal function   2. Hypertensive urgency - presenting BP in ER was systolic greater than 161 - home medications include norvasc, hydralazine, losartan - IM has increased hydralazine to 100 mg TID - continue to monitor pressure   3. Heart racing - she states she has heart racing twice weekly that lasts a few minutes and resolves spontaneously - unclear if this is related to her uncontrolled hypertension - does not sound exertional, not associated with dizziness or syncope - telemetry with artifact and sinus rhythm - recommend a 30 day monitor at discharge - previously bradycardic on 25 mg atenolol in the 40s - avoid atenolol if she needs a beta blocker due to renal function   4. HLD - 05/04/2018: Cholesterol 284; HDL 70.60; LDL Cholesterol 200; Triglycerides 69.0; VLDL 13.83 - followed by PCP - continue crestor, consider increasing dose   5. Renal artery stenosis - 60% right renal artery stenosis in 2017 - repeat renal ultrasound, may be contributing to her labile pressure   6. Heart murmur - will obtain echocardiogram this admission   7. CKD stage III - creatinine today 1.50 - baseline 1.5-1.8 - likely related to renal artery stenosis   Overall, does not sound like an ongoing ACS process.  Will set her up for a 30 day event monitor at discharge. Agree with echocardiogram. If no wall motion abnormality and normal EF, OK to discharge with close cardiology follow up. Consider repeat renal artery ultrasound outpatient.    For questions or updates, please contact [place] Please consult [email or web address] for contact info under Cardiology/STEMI.   Signed, Tami Lin Duke, PA  08/23/2018 7:58 AM As above, patient seen and examined.  Briefly she is a 82 year old female with past medical history of hypertension, hyperlipidemia, renal artery stenosis, hypothyroidism who I am asked to evaluate for palpitations and chest pain.  Patient had a previous nuclear study in March 2017 that showed no ischemia.  She states that when she walks more extended amounts that she will develop heaviness in her chest that resolves with stopping.  This has been present for several years.  She does not have symptoms at rest.  She also describes intermittent palpitations described as her heart racing.  This typically lasts 5 to 10 minutes and resolve spontaneously.  Yesterday  she had a more prolonged episode associated with dyspnea but no chest pain.  She was admitted and cardiology asked to evaluate.  Electrocardiogram shows sinus rhythm, right bundle branch block, left ventricular hypertrophy and nonspecific ST changes; prolonged QT.  Enzymes are 0.03, 0.03, 0.03 and 0.04.  Creatinine 1.5.  1 palpitations-etiology unclear.  No significant arrhythmias on telemetry.  We will arrange outpatient event monitor to further assess.  2 chest heaviness-patient does have chest heaviness with walking more extended amounts.  This has been present for some time.  I will arrange a Leon nuclear study as an outpatient for risk stratification.  3 minimally elevated troponin-no clear trend and not consistent with acute coronary syndrome.  4 history of renal artery stenosis-we will arrange outpatient follow-up renal  Dopplers.  5 hypertension-blood pressure elevated at time of admission.  Agree with increasing hydralazine.  Follow blood pressure as an outpatient and adjust regimen as needed.  Will also follow-up renal Dopplers.  [facility] will sign off.   Medication Recommendations:  Continue present medications as outlined in MAR.   Other recommendations (labs, testing, etc):  We will arrange outpatient event monitor, Lexiscan nuclear study and follow-up renal Dopplers as outlined above.   Follow up as an outpatient:  We will arrange follow-up with APP in 2 weeks.  Follow-up Dr. Tamala Julian in November.  Please call with questions. Cardiology will sign off.   Kirk Ruths, MD

## 2018-08-23 NOTE — Progress Notes (Signed)
  Echocardiogram 2D Echocardiogram has been performed.  Carolyn Myers 08/23/2018, 9:59 AM

## 2018-08-23 NOTE — Progress Notes (Signed)
Pt stable, ambulatory, and verbalizes understanding of d/c instructions.  

## 2018-08-23 NOTE — Discharge Summary (Addendum)
Physician Discharge Summary  Carolyn Myers IZT:[id number] DOB: [birth year] DOA: 08/22/2018  PCP: Darreld Mclean, MD  Admit date: 08/22/2018 Discharge date: 08/23/2018  Admitted From: Home  Disposition: To home  Recommendations for Outpatient Follow-up:  1. Follow up with PCP, Dr. Janett Billow, planned this week with CBC and BMET (chemistries) 2. PCP to arrange follow up GI evaluation for reflux which may include upper endoscopy 3. Stress test, 30 day event monitor, renal U/S  Home Health: no Equipment/Devices: none  Discharge Condition: Stable  CODE STATUS:DNR  Diet recommendation:    Heart Healthy   Brief/Interim Summary:  82 year old woman with a history of hypertension, hyperlipidemia, hypothyroidism, CKD stage III, anemia of chronic disease, presenting to the emergency department with chest pain and palpitations.  Pain was substernal, and severe, nonradiating.  She also complained of "heart racing "occurring at least twice daily, with a rate up to the 382 systolic.  She took aspirin, which did not relieve her symptoms.  She denies any nausea or vomiting.  She denies any shortness of breath.  No abdominal pain.  No dysuria, gross hematuria, lower extremity swelling or calf pain.  She is compliant with her medications. EKG was unremarkable for any acute ST-T changes, troponins were negative. BP was elevated for which Hydralazine was increased from 50 mg to 100 mg tid with better controlled.This is to be continued as OP .    Cardiology  recommended that she be seen once discharged, at which time a Myoview and Holter monitor for her to be arranged, to further evaluate her symptoms.  In addition she had one episode of "chest pain on 08/23/2018, but as GI source was suspected, GI cocktail was given, with significant improvement of her symptoms.  GI evaluation to be arranged by her PCP , to rule out any GI source of her chest discomfort.  She denies having had any GI work-up in the  past.   Discharge Diagnoses:  Principal Problem:   Atypical chest pain Active Problems:   Hyperlipidemia LDL goal <100   Renal artery stenosis (HCC)   Hypothyroidism   Esophageal reflux   CKD (chronic kidney disease) stage 3, GFR 30-59 ml/min (HCC)   Hypertensive urgency  Chest pain, EKG negative for ST-T changes.  She has RBBB with TWI in V4, 5 and 6, which were noted in 2017.  Troponins negative.  2D echo Normal LV F, EF 55 to 60%, with grade 2 diastolic dysfunction.    Cardiology evaluated the patient, Dr. Stanford Breed, who recommended that the patient be seen at the office on 08/25/2018 for Myoview and for Holter monitor placement in order to further evaluate her chest pain or palpitations. -also outpatient renal U/S   Palpitations, in the setting of cardiac complaints, as well as decreased TSH  She denies any syncope or presyncope.   -TSH was found to be 0.088 today, which may have had had an involvement in her symptoms.   -Hold her Synthroid until seen by PCP within this week.  She will need to restart it at a lower dose. -Holter monitor as above.   Hypertensive urgency.  The patient has a history of uncontrolled hypertension, in the setting of renal artery stenosis.  Her hydralazine was increased to 100 mg 3 times daily, along with taking Norvasc and losartan.  She responded well, her BP today is within therapeutic range. BP  141/57   Pulse 61  Continue hydralazine on an increased dose Continue Norvasc and losartan home dose Follow-up with cardiology  on 08/25/2018.  Hyperlipidemia,05/04/2018: Cholesterol 284; HDL 70.60; LDL Cholesterol 200; Triglycerides 69.0; VLDL 13.83  continue Crestor.  Consider increase  Hypothyroidism: Hold  home Synthroid as above as she is now hyperthyroid.  Follow up with Dr. Lorelei Pont to adjust  CKD stage III, baseline creatinine 1.5-1.8, with GFR of 36.  Creatinine today is 1.5 Continue ARB The patient has a history of 60% right renal artery stenosis  in 2017 -renal U/S per cardiology  GERD, patient has a history of reflux, and during her hospitalization, having intermittent episodes, which mimicked cardiac complaints, especially after eating a heavy meal.  She received a doses of GI cocktail, with  improvement of her symptoms. -on zantac- ? Need for GI referral for endoscopy vs manage with diet and medications    Discharge Instructions  Discharge Instructions    Call MD for:  difficulty breathing, headache or visual disturbances   Complete by:  As directed    Call MD for:  extreme fatigue   Complete by:  As directed    Call MD for:  hives   Complete by:  As directed    Call MD for:  persistant dizziness or light-headedness   Complete by:  As directed    Call MD for:  persistant nausea and vomiting   Complete by:  As directed    Call MD for:  redness, tenderness, or signs of infection (pain, swelling, redness, odor or green/yellow discharge around incision site)   Complete by:  As directed    Call MD for:  severe uncontrolled pain   Complete by:  As directed    Call MD for:  temperature >100.4   Complete by:  As directed    Diet - low sodium heart healthy   Complete by:  As directed    Diet - low sodium heart healthy   Complete by:  As directed    Discharge instructions   Complete by:  As directed    Call your cardiologist for any cardiac questions Follow up with PCP this week to arrange to see GI doctor for possible endoscopy to evaluate GERD (reflux) symptoms  Cardiology to see you on 9/5 8 am for Myoperfusion test  . Arrive 15 mins earlier   Discharge instructions   Complete by:  As directed    Hold synthroid until seen by PCP-- will need a lower dose when restarted GERD diet-- monitor symptoms Follow up for your outpatient tests- renal U/S, 30 day event monitor and stress test   Increase activity slowly   Complete by:  As directed    Increase activity slowly   Complete by:  As directed    No wound care   Complete  by:  As directed    Walk with assistance   Complete by:  As directed      Allergies as of 08/23/2018      Reactions   Pineapple Swelling   Reaction to fresh pineapple - tongue swells, but breathing is not affected      Medication List    STOP taking these medications   levothyroxine 200 MCG tablet Commonly known as:  SYNTHROID, LEVOTHROID     TAKE these medications   acetaminophen 325 MG tablet Commonly known as:  TYLENOL Take 650 mg by mouth every 6 (six) hours as needed (for pain or headaches).   amLODipine 10 MG tablet Commonly known as:  NORVASC Take 1 tablet (10 mg total) by mouth daily.   aspirin 81 MG EC tablet Take  1 tablet (81 mg total) by mouth daily.   [employer] LUBRICANT EYE DROPS PF OP Place 1-2 drops into both eyes 3 (three) times daily as needed (for dry eyes).   hydrALAZINE 100 MG tablet Commonly known as:  APRESOLINE Take 1 tablet (100 mg total) by mouth 3 (three) times daily. Monitor your blood pressure, hold for BP 100/55 and pulse 55 What changed:    medication strength  how much to take  additional instructions   losartan 50 MG tablet Commonly known as:  COZAAR Take 1 tablet (50 mg total) by mouth daily.   nitroGLYCERIN 0.4 MG SL tablet Commonly known as:  NITROSTAT Place 1 tablet (0.4 mg total) under the tongue every 5 (five) minutes x 3 doses as needed for chest pain.   ranitidine 300 MG tablet Commonly known as:  ZANTAC Take 0.5 tablets (150 mg total) by mouth 2 (two) times daily.   rosuvastatin 10 MG tablet Commonly known as:  CRESTOR Take 1 tablet (10 mg total) by mouth daily. What changed:  when to take this      [facility] Follow up on 08/25/2018.   Specialty:  Cardiology Why:  8:00 AM lexiscan myoview 2:00 PM heart monitor Contact information: [address], [address] [place], [place] Follow up on 10/06/2018.    Specialty:  Cardiology Why:  10:00 AM Contact information: [address] [place] [address] [phone]        Darreld Mclean, MD Follow up in 1 week(s).   Specialty:  Family Medicine Why:  for adjustment of synthroid and referral to GI if still having GERD issues with dietary changes Contact information: [address] [place] [address] [phone]        Belva Crome, MD .   Specialty:  Cardiology Contact information: [address] [place] [address] [phone]          Allergies  Allergen Reactions  . Pineapple Swelling    Reaction to fresh pineapple - tongue swells, but breathing is not affected    Consultations:   Physician/Group: Cardiology [facility] , Dr. Stanford Breed   Procedures/Studies: Dg Chest 2 View  Result Date: 08/22/2018 CLINICAL DATA:  82 year old female with history of chest pain and epigastric pain. EXAM: CHEST - 2 VIEW COMPARISON:  Chest x-ray 07/01/2016. FINDINGS: Lung volumes are normal. No consolidative airspace disease. No pleural effusions. No pneumothorax. No pulmonary nodule or mass noted. Pulmonary vasculature and the cardiomediastinal silhouette are within normal limits. Atherosclerosis in the thoracic aorta. IMPRESSION: 1.  No radiographic evidence of acute cardiopulmonary disease. Electronically Signed   By: Vinnie Langton M.D.   On: 08/22/2018 15:38      Subjective: Overall status improved. Reports feeling better.   Discharge Exam: Vitals:   08/23/18 1243 08/23/18 1420  BP: (!) 155/61 (!) 141/57  Pulse:  61  Resp:  20  Temp:  99.2 F (37.3 C)  SpO2:  100%   Vitals:   08/23/18 1236 08/23/18 1241 08/23/18 1243 08/23/18 1420  BP: (!) 206/69 (!) 145/61 (!) 155/61 (!) 141/57  Pulse:    61  Resp:    20  Temp:    99.2 F (37.3 C)  TempSrc:    Oral  SpO2:    100%  Weight:      Height:  General: Pt is alert, awake, not in acute distress     The  results of significant diagnostics from this hospitalization (including imaging, microbiology, ancillary and laboratory) are listed below for reference.     Microbiology: No results found for this or any previous visit (from the past 240 hour(s)).   Labs: BNP (last 3 results) Recent Labs    08/22/18 1635  BNP 505.3*   Basic Metabolic Panel: Recent Labs  Lab 08/22/18 1528 08/22/18 1635 08/23/18 0544  NA 143  --  143  K 3.8  --  3.6  CL 109  --  112*  CO2 23  --  23  GLUCOSE 139*  --  94  BUN 25*  --  24*  CREATININE 1.54*  --  1.50*  CALCIUM 9.4  --  8.9  MG  --  2.0 2.1   Liver Function Tests: Recent Labs  Lab 08/23/18 0544  AST 17  ALT 12  ALKPHOS 79  BILITOT 0.8  PROT 6.3*  ALBUMIN 3.2*   No results for input(s): LIPASE, AMYLASE in the last 168 hours. No results for input(s): AMMONIA in the last 168 hours. CBC: Recent Labs  Lab 08/22/18 1528 08/23/18 0544  WBC 7.4 7.3  HGB 10.9* 9.9*  HCT 35.4* 30.7*  MCV 84.1 82.1  PLT 330 286   Cardiac Enzymes: Recent Labs  Lab 08/22/18 1635 08/22/18 1827 08/22/18 2342 08/23/18 0544  TROPONINI 0.03* 0.03* 0.03* 0.04*   BNP: Invalid input(s): POCBNP CBG: No results for input(s): GLUCAP in the last 168 hours. D-Dimer No results for input(s): DDIMER in the last 72 hours. Hgb A1c No results for input(s): HGBA1C in the last 72 hours. Lipid Profile No results for input(s): CHOL, HDL, LDLCALC, TRIG, CHOLHDL, LDLDIRECT in the last 72 hours. Thyroid function studies Recent Labs    08/22/18 1635  TSH 0.088*   Anemia work up No results for input(s): VITAMINB12, FOLATE, FERRITIN, TIBC, IRON, RETICCTPCT in the last 72 hours. Urinalysis    Component Value Date/Time   COLORURINE YELLOW 02/21/2016 1240   APPEARANCEUR CLEAR 02/21/2016 1240   LABSPEC 1.017 02/21/2016 1240   PHURINE 6.0 02/21/2016 1240   GLUCOSEU NEGATIVE 02/21/2016 1240   HGBUR NEGATIVE 02/21/2016 1240   BILIRUBINUR NEGATIVE 02/21/2016 1240    KETONESUR NEGATIVE 02/21/2016 1240   PROTEINUR NEGATIVE 02/21/2016 1240   NITRITE NEGATIVE 02/21/2016 1240   LEUKOCYTESUR NEGATIVE 02/21/2016 1240   Sepsis Labs Invalid input(s): PROCALCITONIN,  WBC,  LACTICIDVEN Microbiology No results found for this or any previous visit (from the past 240 hour(s)).   Time coordinating discharge: Over 30 minutes  SIGNED:   Eulogio Bear DO [facility] 08/23/2018, 3:50 PM   If 7PM-7AM, please contact night-coverage [email or web address] Password [email or web address]

## 2018-08-23 NOTE — Progress Notes (Signed)
Physical Therapy Evaluation and Discharge Patient Details Name: Carolyn Myers MRN: [id number] DOB: 1936-06-20 Today's Date: 08/23/2018   History of Present Illness  Pt is a 82 y.o. female presenting with atypical chest pain. PMH significant for HTN, HLD, renal artery stenosis, and hypothyroidism.    Clinical Impression  Pt admitted with above diagnosis. PTA pt lived alone and was independent with mobility. At the time of evaluation pt was gross Mod I for all mobility with no c/o of chest pain throughout session. Pt states family members will be checking on her intermittently by phone at d/c. All education completed and pt has no further questions. Pt does not require further services from physical therapy at this time. Thank you for this referral. Physical Therapy is signing off at this time, if need to change please reconsult.       Follow Up Recommendations No PT follow up;Supervision - Intermittent    Equipment Recommendations  None recommended by PT    Recommendations for Other Services       Precautions / Restrictions Precautions Precautions: None Restrictions Weight Bearing Restrictions: No      Mobility  Bed Mobility Overal bed mobility: Needs Assistance Bed Mobility: Supine to Sit     Supine to sit: Modified independent (Device/Increase time)     General bed mobility comments: Mod I for increased time and effort for supine>sit. Pt demonstrated proper hand placement and technique. HOB lowered.  Transfers Overall transfer level: Needs assistance Equipment used: None Transfers: Sit to/from Stand Sit to Stand: Modified independent (Device/Increase time)         General transfer comment: Mod I for increased time. Bed height lower than pts at home but pt powered up to stand without needing the bed height adjusted.    Ambulation/Gait Ambulation/Gait assistance: Supervision;Modified independent (Device/Increase time) Gait Distance (Feet): 150 Feet Assistive  device: None Gait Pattern/deviations: Step-through pattern Gait velocity: decreased Gait velocity interpretation: <1.31 ft/sec, indicative of household ambulator General Gait Details: Pt initially supervision for safety progressing to mod I. No reports of SOB or chest pain throughout gait. Pt c/o of headache initially but headache remained at 3/10 throughout session. Pt did not require any assistive device for ambulation.   Stairs            Wheelchair Mobility    Modified Rankin (Stroke Patients Only)       Balance Overall balance assessment: Mild deficits observed, not formally tested                                           Pertinent Vitals/Pain Pain Assessment: 0-10 Pain Score: 3  Pain Location: head Pain Descriptors / Indicators: Headache Pain Intervention(s): Limited activity within patient's tolerance;Monitored during session    Home Living Family/patient expects to be discharged to:: Private residence Living Arrangements: Alone Available Help at Discharge: Available PRN/intermittently;Family Type of Home: House Home Access: Level entry     Home Layout: One level Home Equipment: None      Prior Function Level of Independence: Independent               Hand Dominance        Extremity/Trunk Assessment   Upper Extremity Assessment Upper Extremity Assessment: Overall WFL for tasks assessed    Lower Extremity Assessment Lower Extremity Assessment: Overall WFL for tasks assessed(gross 4+/5 MMT on bil LE )  Cervical / Trunk Assessment Cervical / Trunk Assessment: Normal  Communication   Communication: No difficulties  Cognition Arousal/Alertness: Awake/alert Behavior During Therapy: WFL for tasks assessed/performed Overall Cognitive Status: Within Functional Limits for tasks assessed                                        General Comments General comments (skin integrity, edema, etc.): Family member  presented at end of session.     Exercises     Assessment/Plan    PT Assessment Patent does not need any further PT services  PT Problem List         PT Treatment Interventions      PT Goals (Current goals can be found in the Care Plan section)  Acute Rehab PT Goals Patient Stated Goal: to go home PT Goal Formulation: With patient Time For Goal Achievement: 08/30/18 Potential to Achieve Goals: Good    Frequency     Barriers to discharge        Co-evaluation               AM-PAC PT "6 Clicks" Daily Activity  Outcome Measure Difficulty turning over in bed (including adjusting bedclothes, sheets and blankets)?: None Difficulty moving from lying on back to sitting on the side of the bed? : A Little Difficulty sitting down on and standing up from a chair with arms (e.g., wheelchair, bedside commode, etc,.)?: None Help needed moving to and from a bed to chair (including a wheelchair)?: None Help needed walking in hospital room?: None Help needed climbing 3-5 steps with a railing? : A Little 6 Click Score: 22    End of Session Equipment Utilized During Treatment: Gait belt Activity Tolerance: Patient tolerated treatment well Patient left: in chair;with family/visitor present;Other (comment)(left with physician assessing pt in chair ) Nurse Communication: Mobility status;Other (comment)(ensure pt comfortable in chair after physician leaves ) PT Visit Diagnosis: Other (comment)(chest pain)    Time: 7858-8502 PT Time Calculation (min) (ACUTE ONLY): 17 min   Charges:   PT Evaluation $PT Eval Low Complexity: 1 Low          Einar Crow, SPT  Student Physical Therapist Acute Rehab [phone]   Einar Crow 08/23/2018, 1:14 PM

## 2018-08-23 NOTE — Discharge Instructions (Signed)
° °  You have a Stress Test scheduled at [facility]. Your doctor has ordered this test to check the blood flow in your heart arteries. ° °Please arrive 15 minutes early for paperwork. The whole test will take several hours. You may want to bring reading material to remain occupied while undergoing different parts of the test. ° °Instructions: °· No food/drink after midnight the night before. °· It is OK to take your morning meds with a sip of water EXCEPT for those types of medicines listed below or otherwise instructed. °· No caffeine/decaf products 24 hours before, including medicines such as Excedrin or Goody Powders. Call if there are any questions.  °· Wear comfortable clothes and shoes.  ° °Special Medication Instructions: °· Beta blockers such as metoprolol (Lopressor/Toprol XL), atenolol (Tenormin), carvedilol (Coreg), nebivolol (Bystolic), bisoprolol (Zebeta), propranolol (Inderal) should not be taken for 24 hours before the test. °· Calcium channel blockers such as diltiazem (Cardizem) or verapmil (Calan) should not be taken for 24 hours before the test. °· Remove nitroglycerin patches and do not take nitrate preparations such as Imdur/isosorbide the day of your test. °· No Persantine/Theophylline or Aggrenox medicines should be used within 24 hours of the test.  °· If you are diabetic, please ask which medications to hold the day of the test ° °What To Expect: °When you arrive in the lab, the technician will inject a small amount of radioactive tracer into your arm through an IV while you are resting quietly. This helps us to form pictures of your heart. You will likely only feel a sting from the IV. After a waiting period, resting pictures will be obtained under a big camera. These are the "before" pictures. ° °Next, you will be prepped for the stress portion of the test. This may include either walking on a treadmill or receiving a medicine that helps to dilate blood vessels in  your heart to simulate the effect of exercise on your heart. If you are walking on a treadmill, you will walk at different paces to try to get your heart rate to a goal number that is based on your age. If your doctor has chosen the pharmacologic test, then you will receive a medicine through your IV that may cause temporary nausea, flushing, shortness of breath and sometimes chest discomfort or vomiting. This is typically short-lived and usually resolves quickly. If you experience symptoms, that does not automatically mean the test is abnormal. Some patients do not experience any symptoms at all. Your blood pressure and heart rate will be monitored, and we will be watching your EKG on a computer screen for any changes. During this portion of the test, the radiologist will inject another small amount of radioactive tracer into your IV. After a waiting period, you will undergo a second set of pictures. These are the "after" pictures. ° °The doctor reading the test will compare the before-and-after images to look for evidence of heart blockages or heart weakness. The test usually takes 1 day to complete, but in certain instances (for example, if a patient is over a certain weight limit), the test may be done over the span of 2 days. ° ° °

## 2018-08-24 ENCOUNTER — Telehealth (HOSPITAL_COMMUNITY): Payer: Self-pay | Admitting: Radiology

## 2018-08-24 ENCOUNTER — Telehealth: Payer: Self-pay

## 2018-08-24 ENCOUNTER — Telehealth: Payer: Self-pay | Admitting: Interventional Cardiology

## 2018-08-24 NOTE — Telephone Encounter (Signed)
Follow up  ° ° °Patient is returning call.  °

## 2018-08-24 NOTE — Telephone Encounter (Signed)
Patient's daughter given detailed instructions per Myocardial Perfusion Study Information Sheet for the test on 08/25/2018 at 8:00. Patient notified to arrive 15 minutes early and that it is imperative to arrive on time for appointment to keep from having the test rescheduled.  If you need to cancel or reschedule your appointment, please call the office within 24 hours of your appointment. . Patient verbalized understanding.EHK

## 2018-08-24 NOTE — Telephone Encounter (Signed)
No message needed °

## 2018-08-24 NOTE — Telephone Encounter (Signed)
08/24/18  Transition Care Management Follow-up Telephone Call  ADMISSION DATE: 08/22/18 DISCHARGE DATE: 08/23/18  Patient will need BMET and CBC.  PCP to arrange follow up  GI evaluation for reflux which may include upper endoscopy Stress test 30 day event monitor , renal U/S   How have you been since you were released from the hospital? Feeling ok today. Ok as long as she does not eat too much,  Do you understand why you were in the hospital? Yes   Do you understand the discharge instrcutions? Yes    Items Reviewed:  Medications reviewed: Yes  Allergies reviewed:NKDA per patient   Dietary changes reviewed: Low sodium heart healthy  Referrals reviewed: Has follow up appointment scheduled with PCP and Cardiology  Functional Questionnaire:  Activities of Daily Living (ADLs): Patent states she has been doing them all herself but feels she needs a little help. Any patient concerns? Needs Home Health Nurse   Confirmed importance and date/time of follow-up visits scheduled: Yes    Confirmed with patient if condition begins to worsen call PCP or go to the ER. YES      Patient was given the office number and encouragred to call back with questions or concerns.Yes

## 2018-08-25 ENCOUNTER — Ambulatory Visit (INDEPENDENT_AMBULATORY_CARE_PROVIDER_SITE_OTHER): Payer: Medicare HMO

## 2018-08-25 ENCOUNTER — Encounter (HOSPITAL_COMMUNITY): Payer: Medicare HMO

## 2018-08-25 ENCOUNTER — Ambulatory Visit (HOSPITAL_COMMUNITY): Payer: Medicare HMO | Attending: Cardiology

## 2018-08-25 DIAGNOSIS — R0609 Other forms of dyspnea: Secondary | ICD-10-CM | POA: Diagnosis not present

## 2018-08-25 DIAGNOSIS — R002 Palpitations: Secondary | ICD-10-CM | POA: Diagnosis not present

## 2018-08-25 DIAGNOSIS — R9439 Abnormal result of other cardiovascular function study: Secondary | ICD-10-CM | POA: Diagnosis not present

## 2018-08-25 DIAGNOSIS — R079 Chest pain, unspecified: Secondary | ICD-10-CM | POA: Diagnosis not present

## 2018-08-25 DIAGNOSIS — I2510 Atherosclerotic heart disease of native coronary artery without angina pectoris: Secondary | ICD-10-CM | POA: Diagnosis not present

## 2018-08-25 DIAGNOSIS — I10 Essential (primary) hypertension: Secondary | ICD-10-CM | POA: Diagnosis not present

## 2018-08-25 LAB — MYOCARDIAL PERFUSION IMAGING
CHL CUP NUCLEAR SRS: 0
CHL CUP RESTING HR STRESS: 64 {beats}/min
CSEPPHR: 96 {beats}/min
LVDIAVOL: 73 mL (ref 46–106)
LVSYSVOL: 35 mL
SDS: 0
SSS: 0
TID: 0.95

## 2018-08-25 MED ORDER — TECHNETIUM TC 99M TETROFOSMIN IV KIT
32.4000 | PACK | Freq: Once | INTRAVENOUS | Status: AC | PRN
  Administered 2018-08-25: 32.4 via INTRAVENOUS
  Filled 2018-08-25: qty 33

## 2018-08-25 MED ORDER — TECHNETIUM TC 99M TETROFOSMIN IV KIT
9.7000 | PACK | Freq: Once | INTRAVENOUS | Status: AC | PRN
  Administered 2018-08-25: 9.7 via INTRAVENOUS
  Filled 2018-08-25: qty 10

## 2018-08-25 MED ORDER — REGADENOSON 0.4 MG/5ML IV SOLN
0.4000 mg | Freq: Once | INTRAVENOUS | Status: AC
  Administered 2018-08-25: 0.4 mg via INTRAVENOUS

## 2018-08-25 NOTE — Progress Notes (Signed)
[place] [facility] [address] [place], [place] [address] [phone]  Date:  08/29/2018   Name:  Carolyn Myers   DOB:  13-Nov-1936   MRN:  [id number]  PCP:  Darreld Mclean, MD    Chief Complaint: Hospitalization Follow-up (chest pain, 08/22/18, blurry vision); Medication Management; and Home Health (would like home health nurse to take care of her)   History of Present Illness:  Carolyn Myers is a 82 y.o. very pleasant female patient who presents with the following:  Hospital follow-up today from recent hospital admit for chest pain She was also found to be hyperthyroid/ overtreated during her admission In May her TSH was quite high as she was not taking her thyroid med, she started back on her previous dose of 200 mcg but did not come in to have a TSH recheck as requested.  and her TSH was found to be suppressed   Lab Results  Component Value Date   TSH 0.088 (L) 08/22/2018    History of sleep apnea, hyperlipidemia, CKD, hypothyroidism, HTN  Admit date: 08/22/2018 Discharge date: 08/23/2018 Recommendations for Outpatient Follow-up:  1. Follow up with PCP, Dr. Janett Billow, planned this week with CBC and BMET (chemistries) 2. PCP to arrange follow up GI evaluation for reflux which may include upper endoscopy 3. Stress test, 30 day event monitor, renal U/S  Brief/Interim Summary:  82 year old woman with a history of hypertension, hyperlipidemia, hypothyroidism, CKD stage III, anemia of chronic disease, presenting to the emergency department with chest pain and palpitations.  Pain was substernal, and severe, nonradiating.  She also complained of "heart racing "occurring at least twice daily, with a rate up to the 295 systolic.  She took aspirin, which did not relieve her symptoms.  She denies any nausea or vomiting.  She denies any shortness of breath.  No abdominal pain.  No dysuria, gross hematuria, lower extremity  swelling or calf pain.  She is compliant with her medications. EKG was unremarkable for any acute ST-T changes, troponins were negative. BP was elevated for which Hydralazine was increased from 50 mg to 100 mg tid with better controlled.This is to be continued as OP .    Cardiology  recommended that she be seen once discharged, at which time a Myoview and Holter monitor for her to be arranged, to further evaluate her symptoms.  In addition she had one episode of "chest pain on 08/23/2018, but as GI source was suspected, GI cocktail was given, with significant improvement of her symptoms.  GI evaluation to be arranged by her PCP , to rule out any GI source of her chest discomfort.  She denies having had any GI work-up in the past.  Discharge Diagnoses:  Principal Problem:   Atypical chest pain Active Problems:   Hyperlipidemia LDL goal <100   Renal artery stenosis (HCC)   Hypothyroidism   Esophageal reflux   CKD (chronic kidney disease) stage 3, GFR 30-59 ml/min (HCC)   Hypertensive urgency  Chest pain, EKG negative for ST-T changes.  She has RBBB with TWI in V4, 5 and 6, which were noted in 2017.  Troponins negative.  2D echo Normal LV F, EF 55 to 60%, with grade 2 diastolic dysfunction.   Cardiology evaluated the patient, Dr. Stanford Breed, who recommended that the patient be seen at the office on 08/25/2018 for Myoview and for Holter monitor placement in order to further evaluate her chest pain or palpitations. -  also outpatient renal U/S Palpitations, in the setting of cardiac complaints, as well as decreased TSH  She denies any syncope or presyncope.   -TSH was found to be 0.088 today, which may have had had an involvement in her symptoms.   -Hold her Synthroid until seen by PCP within this week.  She will need to restart it at a lower dose. -Holter monitor as above. Hypertensive urgency.  The patient has a history of uncontrolled hypertension, in the setting of renal artery stenosis.  Her  hydralazine was increased to 100 mg 3 times daily, along with taking Norvasc and losartan.  She responded well, her BP today is within therapeutic range. BP  141/57   Pulse 61  Continue hydralazine on an increased dose Continue Norvasc and losartan home dose Follow-up with cardiology on 08/25/2018. Hyperlipidemia,05/04/2018: Cholesterol 284; HDL 70.60; LDL Cholesterol 200; Triglycerides 69.0; VLDL 13.83  continue Crestor.  Consider increase Hypothyroidism: Hold  home Synthroid as above as she is now hyperthyroid.  Follow up with Dr. Lorelei Pont to adjust CKD stage III, baseline creatinine 1.5-1.8, with GFR of 36.  Creatinine today is 1.5 Continue ARB The patient has a history of 60% right renal artery stenosis in 2017 -renal U/S per cardiology GERD, patient has a history of reflux, and during her hospitalization, having intermittent episodes, which mimicked cardiac complaints, especially after eating a heavy meal.  She received a doses of GI cocktail, with  improvement of her symptoms. -on zantac- ? Need for GI referral for endoscopy vs manage with diet and medications  flu vaccine needed - give today  Since she got home, she is feeling better She notes that her energy is less than it was in the past but seems to be improving  No chest pain   She has not been to see Dr. Florene Glen recently- her nephrologist.  Advised her that she needs to call him for an appt and she will do do   BP Readings from Last 3 Encounters:  08/29/18 140/80  08/23/18 (!) 144/61  05/04/18 134/78   Her hydralazine was increased to 100 TID while inpt She is still taking amlodipine and losartan   She did her cardiac stress test of 9/5 as below:  Nuclear stress EF: 53%.  This is a low risk study.  The left ventricular ejection fraction is mildly decreased (45-54%).   1. EF 53%, mild diffuse hypokinesis.  2. No evidence for ischemia or infarction on perfusion images.   She is wearing her cardiac event monitor now    Accompanied by her daughter today Patient Active Problem List   Diagnosis Date Noted  . Hypertensive urgency 08/23/2018  . Atypical chest pain 08/22/2018  . Sleep apnea   . Palpitations   . Migraine   . Hyperlipidemia   . History of gout   . History of blood transfusion   . Frequent headaches   . Diverticulitis   . Chicken pox   . Arthritis   . CKD (chronic kidney disease) stage 3, GFR 30-59 ml/min (HCC) 02/23/2017  . Age-related osteoporosis without current pathological fracture 02/23/2017  . Esophageal reflux 05/08/2016  . Renal artery stenosis ([place]) 02/22/2016  . Hypothyroidism 02/22/2016  . Chest pain with high risk for cardiac etiology 02/21/2016  . Gout   . SOB (shortness of breath) on exertion 10/06/2015  . Accelerated hypertension 10/06/2015  . Essential hypertension, benign 11/26/2013  . Hyperlipidemia LDL goal <100 11/26/2013    Past Medical History:  Diagnosis Date  .  Arthritis    "minor in my shoulders" (02/21/2016)  . Chicken pox   . Diverticulitis   . Frequent headaches    "maybe twice/week" (03/12/2016)  . Gout    Right Foot  . History of blood transfusion    "said my blood was low"  . History of gout   . Hyperlipidemia   . Hypertension   . Hypothyroidism   . Migraine    "maybe twice/year" (02/21/2016)  . Palpitations    "doctor thought it was related to my thyroid"  . Sleep apnea    "I don't wear my mask" (02/21/2016)    Past Surgical History:  Procedure Laterality Date  . ABDOMINAL HYSTERECTOMY     "for fibroid tumors"  . TUBAL LIGATION      Social History   Tobacco Use  . Smoking status: Never Smoker  . Smokeless tobacco: Never Used  Substance Use Topics  . Alcohol use: No  . Drug use: No    Family History  Problem Relation Age of Onset  . Heart disease Mother   . Heart attack Mother        died from it  . Hypertension Mother   . Arthritis Mother   . Stroke Father   . Cancer Maternal Grandmother   . Heart attack Maternal  Aunt   . Renal Disease Maternal Aunt   . Multiple myeloma Maternal Uncle   . Emphysema Maternal Uncle   . Heart disease Sister   . Thyroid disease Sister   . Healthy Son        x3  . Healthy Daughter        x5    Allergies  Allergen Reactions  . Pineapple Swelling    Reaction to fresh pineapple - tongue swells, but breathing is not affected    Medication list has been reviewed and updated.  Current Outpatient Medications on File Prior to Visit  Medication Sig Dispense Refill  . acetaminophen (TYLENOL) 325 MG tablet Take 650 mg by mouth every 6 (six) hours as needed (for pain or headaches).     Marland Kitchen amLODipine (NORVASC) 10 MG tablet Take 1 tablet (10 mg total) by mouth daily. 90 tablet 3  . aspirin EC 81 MG EC tablet Take 1 tablet (81 mg total) by mouth daily.    . Carboxymethylcellulose Sodium ([employer] LUBRICANT EYE DROPS PF OP) Place 1-2 drops into both eyes 3 (three) times daily as needed (for dry eyes).    . hydrALAZINE (APRESOLINE) 100 MG tablet Take 1 tablet (100 mg total) by mouth 3 (three) times daily. Monitor your blood pressure, hold for BP 100/55 and pulse 55 90 tablet 0  . losartan (COZAAR) 50 MG tablet Take 1 tablet (50 mg total) by mouth daily. 90 tablet 3  . nitroGLYCERIN (NITROSTAT) 0.4 MG SL tablet Place 1 tablet (0.4 mg total) under the tongue every 5 (five) minutes x 3 doses as needed for chest pain. 25 tablet 5  . ranitidine (ZANTAC) 300 MG tablet Take 0.5 tablets (150 mg total) by mouth 2 (two) times daily. 90 tablet 3  . rosuvastatin (CRESTOR) 10 MG tablet Take 1 tablet (10 mg total) by mouth daily. (Patient taking differently: Take 10 mg by mouth at bedtime. ) 90 tablet 3   No current facility-administered medications on file prior to visit.     Review of Systems:  As per HPI- otherwise negative. No fever or chills No CP or SOB   Physical Examination: Vitals:   08/29/18 1133  08/29/18 1154  BP: (!) 174/82 140/80  Pulse: 69   Resp: 16   Temp: 98.6 F (37  C)   SpO2: 98%    Vitals:   08/29/18 1133  Weight: 169 lb (76.7 kg)  Height: '5\' 3"'$  (1.6 m)   Body mass index is 29.94 kg/m. Ideal Body Weight: Weight in (lb) to have BMI = 25: 140.8  GEN: WDWN, NAD, Non-toxic, A & O x 3, overweight, looks well  HEENT: Atraumatic, Normocephalic. Neck supple. No masses, No LAD.  Bilateral TM wnl, oropharynx normal.  PEERL,EOMI.   Ears and Nose: No external deformity. CV: RRR, No M/G/R. No JVD. No thrill. No extra heart sounds. PULM: CTA B, no wheezes, crackles, rhonchi. No retractions. No resp. distress. No accessory muscle use. ABD: S, NT, ND EXTR: No c/c/e NEURO Normal gait.  PSYCH: Normally interactive. Conversant. Not depressed or anxious appearing.  Calm demeanor.    Assessment and Plan: Renal insufficiency - Plan: US Renal Artery Stenosis  Hypertension, unspecified type  Acquired hypothyroidism - Plan: levothyroxine (SYNTHROID, LEVOTHROID) 50 MCG tablet, TSH  Medication monitoring encounter  Need for influenza vaccination - Plan: Flu vaccine HIGH DOSE PF (Fluzone High dose)  Significant renal insuf with no recent nephrology visit- asked her to schedule a follow-up Will also get a repeat renal artery [place] for her  BP appears to be under reasonable control today She has cardiology follow-up already arranged Flu shot given Will start back on 50 mcg of thyroid replacement (she was on 200 mcg) and she will come in for a TSH in 3 weeks  She expressed a feeling of not wanting to do all her cooking/ cleaning any longer.  Wonders about home health but advised that a home aid is what she really needs- they will look into this   Signed Lamar Blinks, MD

## 2018-08-29 ENCOUNTER — Ambulatory Visit (INDEPENDENT_AMBULATORY_CARE_PROVIDER_SITE_OTHER): Payer: Medicare HMO | Admitting: Family Medicine

## 2018-08-29 ENCOUNTER — Encounter: Payer: Self-pay | Admitting: Family Medicine

## 2018-08-29 VITALS — BP 140/80 | HR 69 | Temp 98.6°F | Resp 16 | Ht 63.0 in | Wt 169.0 lb

## 2018-08-29 DIAGNOSIS — I10 Essential (primary) hypertension: Secondary | ICD-10-CM

## 2018-08-29 DIAGNOSIS — E039 Hypothyroidism, unspecified: Secondary | ICD-10-CM

## 2018-08-29 DIAGNOSIS — Z23 Encounter for immunization: Secondary | ICD-10-CM | POA: Diagnosis not present

## 2018-08-29 DIAGNOSIS — Z5181 Encounter for therapeutic drug level monitoring: Secondary | ICD-10-CM | POA: Diagnosis not present

## 2018-08-29 DIAGNOSIS — N289 Disorder of kidney and ureter, unspecified: Secondary | ICD-10-CM | POA: Diagnosis not present

## 2018-08-29 MED ORDER — LEVOTHYROXINE SODIUM 50 MCG PO TABS: 50.0000 ug | ORAL_TABLET | Freq: Every day | ORAL | 6 refills | Status: DC

## 2018-08-29 NOTE — Patient Instructions (Addendum)
Good to see you today!   Please follow-up with cardiology as planned I am going to set up a renal (kidney) artery ultrasound for you in [facility] Please call Dr. Florene Glen and set up a follow-up with with him soon  Thyroid- start back on 50 mcg once a day.  Please come in for a lab visit only in about 3 weeks to check your level, we can then adjust as needed   It sounds like you need a home aid- you might try [facility] or another agency of your choice

## 2018-08-31 ENCOUNTER — Telehealth: Payer: Self-pay | Admitting: *Deleted

## 2018-08-31 NOTE — Telephone Encounter (Signed)
Received [id number] Request for independent Assessment for PCS Attestation of Medical Need, completed as much as possible; forwarded to provider/SLS 09/11

## 2018-09-06 ENCOUNTER — Other Ambulatory Visit: Payer: Self-pay | Admitting: Family Medicine

## 2018-09-06 DIAGNOSIS — R001 Bradycardia, unspecified: Secondary | ICD-10-CM

## 2018-09-06 DIAGNOSIS — I10 Essential (primary) hypertension: Secondary | ICD-10-CM

## 2018-09-19 ENCOUNTER — Other Ambulatory Visit (INDEPENDENT_AMBULATORY_CARE_PROVIDER_SITE_OTHER): Payer: Medicare HMO

## 2018-09-19 ENCOUNTER — Other Ambulatory Visit: Payer: Self-pay | Admitting: Family Medicine

## 2018-09-19 ENCOUNTER — Encounter: Payer: Self-pay | Admitting: Family Medicine

## 2018-09-19 DIAGNOSIS — E039 Hypothyroidism, unspecified: Secondary | ICD-10-CM

## 2018-09-19 LAB — TSH: TSH: 20.31 u[IU]/mL — AB (ref 0.35–4.50)

## 2018-09-19 MED ORDER — LEVOTHYROXINE SODIUM 75 MCG PO TABS: 75.0000 ug | ORAL_TABLET | Freq: Every day | ORAL | 3 refills | Status: DC

## 2018-10-04 NOTE — Progress Notes (Signed)
Cardiology Office Note    Date:  10/06/2018   ID:  Carolyn Myers, DOB May 02, 1936, MRN [id number]  PCP:  Darreld Mclean, MD  Cardiologist: Dr. Tamala Julian   Chief Complaint: Hospital follow up   History of Present Illness:   Carolyn Myers is a 82 y.o. female with a hx of HTN, HLD, renal artery stenosis with CKD stage III, and hypothyroidism presents for hospital follow up.   Carolyn Myers was last seen by Dr. Tamala Julian in clinic on 11/05/16. She has a history of mildly elevated troponin in the setting of hypertensive urgency. She underwent nuclear stress test that was low risk with an EF of 55% (02/22/16).  CT chest 02/21/16 with diffuse atherosclerotic disease in the aorta and visceral arteries with significant plaque in the posterior aortic arch and proximal descending thoracic aorta. She has a history of chest pain that was resolved in the ER with the GI cocktail and was not responsive to nitro (06/2016). She was unable to tolerate imdur due to headaches.  Admitted 08/2018 for chest pain and palpitation of unclear etiology.  She ruled out.  Blood pressure elevated during admission.  Medication adjusted.  Echocardiogram 08/23/2018 showed normal LV function with grade 2 diastolic dysfunction.  Aortic sclerosis without significant stenosis. Myoivew low risk without ischemia. Monitor showed sinus rhythm with occasional PACs and PVCs. No arrhythmias. Pending renal doppler (order by PCP 08/29/18).   Here today for follow up with daughter. BP elevated today but hasn't took any of her medications. The patient denies nausea, vomiting, fever, chest pain, palpitations, shortness of breath, orthopnea, PND, dizziness, syncope, cough, congestion, abdominal pain, hematochezia, melena, lower extremity edema.   Past Medical History:  Diagnosis Date  . Arthritis    "minor in my shoulders" (02/21/2016)  . Chicken pox   . Diverticulitis   . Frequent headaches    "maybe twice/week" (03/12/2016)  . Gout    Right  Foot  . History of blood transfusion    "said my blood was low"  . History of gout   . Hyperlipidemia   . Hypertension   . Hypothyroidism   . Migraine    "maybe twice/year" (02/21/2016)  . Palpitations    "doctor thought it was related to my thyroid"  . Sleep apnea    "I don't wear my mask" (02/21/2016)    Past Surgical History:  Procedure Laterality Date  . ABDOMINAL HYSTERECTOMY     "for fibroid tumors"  . TUBAL LIGATION      Current Medications: Prior to Admission medications   Medication Sig Start Date End Date Taking? Authorizing Provider  acetaminophen (TYLENOL) 325 MG tablet Take 650 mg by mouth every 6 (six) hours as needed (for pain or headaches).     [provider]  amLODipine (NORVASC) 10 MG tablet Take 1 tablet (10 mg total) by mouth daily. 05/04/18   Copland, Gay Filler, MD  aspirin EC 81 MG EC tablet Take 1 tablet (81 mg total) by mouth daily. 02/22/16   Brett Canales, PA-C  Carboxymethylcellulose Sodium ([employer] LUBRICANT EYE DROPS PF OP) Place 1-2 drops into both eyes 3 (three) times daily as needed (for dry eyes).    [provider]  hydrALAZINE (APRESOLINE) 100 MG tablet Take 1 tablet (100 mg total) by mouth 3 (three) times daily. Monitor your blood pressure, hold for BP 100/55 and pulse 55 08/23/18   Vann, Jessica U, DO  levothyroxine (SYNTHROID, LEVOTHROID) 75 MCG tablet Take 1 tablet (75 mcg  total) by mouth daily before breakfast. 09/19/18   Copland, Gay Filler, MD  losartan (COZAAR) 50 MG tablet Take 1 tablet (50 mg total) by mouth daily. 05/04/18   Copland, Gay Filler, MD  losartan (COZAAR) 50 MG tablet TAKE 1/2 TABLET BY MOUTH EVERY DAY 09/06/18   Copland, Gay Filler, MD  nitroGLYCERIN (NITROSTAT) 0.4 MG SL tablet Place 1 tablet (0.4 mg total) under the tongue every 5 (five) minutes x 3 doses as needed for chest pain. 11/05/16   Belva Crome, MD  ranitidine (ZANTAC) 300 MG tablet Take 0.5 tablets (150 mg total) by mouth 2 (two) times daily. 05/04/18    Copland, Gay Filler, MD  rosuvastatin (CRESTOR) 10 MG tablet Take 1 tablet (10 mg total) by mouth daily. Patient taking differently: Take 10 mg by mouth at bedtime.  05/12/18   Copland, Gay Filler, MD    Allergies:   Pineapple   Social History   Socioeconomic History  . Marital status: Widowed    Spouse name: Not on file  . Number of children: Not on file  . Years of education: Not on file  . Highest education level: Not on file  Occupational History  . Occupation: Retired  Scientific laboratory technician  . Financial resource strain: Not very hard  . Food insecurity:    Worry: Never true    Inability: Never true  . Transportation needs:    Medical: No    Non-medical: No  Tobacco Use  . Smoking status: Never Smoker  . Smokeless tobacco: Never Used  Substance and Sexual Activity  . Alcohol use: No  . Drug use: No  . Sexual activity: Never  Lifestyle  . Physical activity:    Days per week: 0 days    Minutes per session: 0 min  . Stress: Not at all  Relationships  . Social connections:    Talks on phone: Not on file    Gets together: Not on file    Attends religious service: Not on file    Active member of club or organization: Not on file    Attends meetings of clubs or organizations: Not on file    Relationship status: Not on file  Other Topics Concern  . Not on file  Social History Narrative   Lives alone, family helps in her care.     Family History:  The patient's family history includes Arthritis in her mother; Cancer in her maternal grandmother; Emphysema in her maternal uncle; Healthy in her daughter and son; Heart attack in her maternal aunt and mother; Heart disease in her mother and sister; Hypertension in her mother; Multiple myeloma in her maternal uncle; Renal Disease in her maternal aunt; Stroke in her father; Thyroid disease in her sister.   ROS:   Please see the history of present illness.    ROS All other systems reviewed and are negative.   PHYSICAL EXAM:   VS:   BP (!) 180/70   Pulse 60   Ht '5\' 3"'$  (1.6 m)   Wt 167 lb 12.8 oz (76.1 kg)   SpO2 98%   BMI 29.72 kg/m    GEN: Well nourished, well developed, in no acute distress  HEENT: normal  Neck: no JVD, carotid bruits, or masses Cardiac: RRR; no murmurs, rubs, or gallops,no edema  Respiratory:  clear to auscultation bilaterally, normal work of breathing GI: soft, nontender, nondistended, + BS MS: no deformity or atrophy  Skin: warm and dry, no rash Neuro:  Alert and Oriented x  3, Strength and sensation are intact Psych: euthymic mood, full affect  Wt Readings from Last 3 Encounters:  10/06/18 167 lb 12.8 oz (76.1 kg)  08/29/18 169 lb (76.7 kg)  08/25/18 172 lb (78 kg)      Studies/Labs Reviewed:   EKG:  EKG is not ordered today.    Recent Labs: 08/22/2018: B Natriuretic Peptide 237.0 08/23/2018: ALT 12; BUN 24; Creatinine, Ser 1.50; Hemoglobin 9.9; Magnesium 2.1; Platelets 286; Potassium 3.6; Sodium 143 09/19/2018: TSH 20.31   Lipid Panel    Component Value Date/Time   CHOL 284 (H) 05/04/2018 1544   TRIG 69.0 05/04/2018 1544   HDL 70.60 05/04/2018 1544   CHOLHDL 4 05/04/2018 1544   VLDL 13.8 05/04/2018 1544   LDLCALC 200 (H) 05/04/2018 1544    Additional studies/ records that were reviewed today include:   08/25/18 Myoview  Nuclear stress EF: 53%.  This is a low risk study.  The left ventricular ejection fraction is mildly decreased (45-54%).   1. EF 53%, mild diffuse hypokinesis.  2. No evidence for ischemia or infarction on perfusion images.     Echocardiogram: 08/23/18 Study Conclusions  - Left ventricle: The cavity size was normal. Systolic function was   normal. The estimated ejection fraction was in the range of 55%   to 60%. Wall motion was normal; there were no regional wall   motion abnormalities. Features are consistent with a pseudonormal   left ventricular filling pattern, with concomitant abnormal   relaxation and increased filling pressure (grade 2  diastolic   dysfunction). - Aortic valve: Trileaflet; mildly thickened, mildly calcified   leaflets. Sclerosis without significant stenosis. - Mitral valve: There was trivial regurgitation. - Left atrium: The atrium was moderately dilated. - Right atrium: The atrium was mildly dilated. - Atrial septum: No defect or patent foramen ovale was identified. - Tricuspid valve: There was trivial regurgitation. - Pulmonic valve: There was no significant regurgitation.  Impressions:  - Normal LV F with grade 2 diastolic dysfunction. Aortic valve   sclerosis without significant stenosis.  Monitor 08/25/18  Normal sinus rhythm with occasional SB  Occasional PAC's and PVC's  No correlation between symptoms and rhythm  No significant arrhythmia.    ASSESSMENT & PLAN:    1. HTN - BP elevated by hasn't took her medications. Advised to take medicine at schedule time. Keep log and bring during next office visit for review.   2. History of renal artery stenosis - Pending doppler  3. Palpitations - No arrhthymias on monitor.  Occasional PAC's and PVC's. Symptoms resolved.    Medication Adjustments/Labs and Tests Ordered: Current medicines are reviewed at length with the patient today.  Concerns regarding medicines are outlined above.  Medication changes, Labs and Tests ordered today are listed in the Patient Instructions below. Patient Instructions  Medication Instructions:  Your physician recommends that you continue on your current medications as directed. Please refer to the Current Medication list given to you today.  If you need a refill on your cardiac medications before your next appointment, please call your pharmacy.   Lab work: None ordered If you have labs (blood work) drawn today and your tests are completely normal, you will receive your results only by: Marland Kitchen MyChart Message (if you have MyChart) OR . A paper copy in the mail If you have any lab test that is abnormal or we  need to change your treatment, we will call you to review the results.  Testing/Procedures:None ordered this vist, but  you do need to get the Renal US scheduled  Follow-Up: At [facility], you and your health needs are our priority.  As part of our continuing mission to provide you with exceptional heart care, we have created designated Provider Care Teams.  These Care Teams include your primary Cardiologist (physician) and Advanced Practice Providers (APPs -  Physician Assistants and Nurse Practitioners) who all work together to provide you with the care you need, when you need it. . You will need to keep your follow up appt with Dr. Tamala Julian next month as scheduled  Any Other Special Instructions Will Be Listed Below (If Applicable).       Jarrett Soho, [place]  10/06/2018 10:28 AM    [facility] [address], [place], [place]  [address] Phone: [phone]; Fax: [phone]

## 2018-10-06 ENCOUNTER — Ambulatory Visit: Payer: Medicare HMO | Admitting: Physician Assistant

## 2018-10-06 ENCOUNTER — Encounter: Payer: Self-pay | Admitting: Physician Assistant

## 2018-10-06 VITALS — BP 180/70 | HR 60 | Ht 63.0 in | Wt 167.8 lb

## 2018-10-06 DIAGNOSIS — R002 Palpitations: Secondary | ICD-10-CM | POA: Diagnosis not present

## 2018-10-06 DIAGNOSIS — N183 Chronic kidney disease, stage 3 unspecified: Secondary | ICD-10-CM

## 2018-10-06 DIAGNOSIS — I10 Essential (primary) hypertension: Secondary | ICD-10-CM | POA: Diagnosis not present

## 2018-10-06 DIAGNOSIS — I701 Atherosclerosis of renal artery: Secondary | ICD-10-CM

## 2018-10-06 NOTE — Patient Instructions (Signed)
Medication Instructions:  Your physician recommends that you continue on your current medications as directed. Please refer to the Current Medication list given to you today.  If you need a refill on your cardiac medications before your next appointment, please call your pharmacy.   Lab work: None ordered If you have labs (blood work) drawn today and your tests are completely normal, you will receive your results only by: Marland Kitchen MyChart Message (if you have MyChart) OR . A paper copy in the mail If you have any lab test that is abnormal or we need to change your treatment, we will call you to review the results.  Testing/Procedures:None ordered this vist, but you do need to get the Renal US scheduled  Follow-Up: At [facility], you and your health needs are our priority.  As part of our continuing mission to provide you with exceptional heart care, we have created designated Provider Care Teams.  These Care Teams include your primary Cardiologist (physician) and Advanced Practice Providers (APPs -  Physician Assistants and Nurse Practitioners) who all work together to provide you with the care you need, when you need it. . You will need to keep your follow up appt with Dr. Tamala Julian next month as scheduled  Any Other Special Instructions Will Be Listed Below (If Applicable).

## 2018-10-06 NOTE — Addendum Note (Signed)
Addended by: Gaetano Net on: 10/06/2018 10:39 AM   Modules accepted: Orders

## 2018-10-12 ENCOUNTER — Ambulatory Visit (HOSPITAL_COMMUNITY)
Admission: RE | Admit: 2018-10-12 | Discharge: 2018-10-12 | Disposition: A | Discharge status: Home / Self Care | Payer: Medicare HMO | Source: Ambulatory Visit | Attending: Internal Medicine | Admitting: Internal Medicine

## 2018-10-12 DIAGNOSIS — I701 Atherosclerosis of renal artery: Secondary | ICD-10-CM | POA: Diagnosis not present

## 2018-10-27 NOTE — Progress Notes (Signed)
Cardiology Office Note:    Date:  10/28/2018   ID:  Michail Sermon, DOB January 21, 1936, MRN [id number]  PCP:  Darreld Mclean, MD  Cardiologist:  Sinclair Grooms, MD   Referring MD: Darreld Mclean, MD   Chief Complaint  Patient presents with  . Chest Pain  . Hypertension    History of Present Illness:    Carolyn Myers is a 82 y.o. female with a hx of hypertension, hyperlipidemia, renal artery stenosis, Chest pain, and hypothyroidism. In March 2017 CT of the abdomen and pelvis showed bilateral renal artery stenosis,  The patient is doing well.  She is accompanied by her daughter and great-granddaughter.  She has no chest pain.  She denies dyspnea on exertion, orthopnea, and edema.  No significant palpitations or other complaints.  No medication side effects.  Past Medical History:  Diagnosis Date  . Arthritis    "minor in my shoulders" (02/21/2016)  . Chicken pox   . Diverticulitis   . Frequent headaches    "maybe twice/week" (03/12/2016)  . Gout    Right Foot  . History of blood transfusion    "said my blood was low"  . History of gout   . Hyperlipidemia   . Hypertension   . Hypothyroidism   . Migraine    "maybe twice/year" (02/21/2016)  . Palpitations    "doctor thought it was related to my thyroid"  . Sleep apnea    "I don't wear my mask" (02/21/2016)    Past Surgical History:  Procedure Laterality Date  . ABDOMINAL HYSTERECTOMY     "for fibroid tumors"  . TUBAL LIGATION      Current Medications: Current Meds  Medication Sig  . acetaminophen (TYLENOL) 325 MG tablet Take 650 mg by mouth every 6 (six) hours as needed (for pain or headaches).   Marland Kitchen amLODipine (NORVASC) 10 MG tablet Take 1 tablet (10 mg total) by mouth daily.  Marland Kitchen aspirin EC 81 MG EC tablet Take 1 tablet (81 mg total) by mouth daily.  . Carboxymethylcellulose Sodium ([employer] LUBRICANT EYE DROPS PF OP) Place 1-2 drops into both eyes 3 (three) times daily as needed (for dry eyes).  .  hydrALAZINE (APRESOLINE) 100 MG tablet Take 1 tablet (100 mg total) by mouth 3 (three) times daily. Monitor your blood pressure, hold for BP 100/55 and pulse 55  . levothyroxine (SYNTHROID, LEVOTHROID) 75 MCG tablet Take 1 tablet (75 mcg total) by mouth daily before breakfast.  . losartan (COZAAR) 50 MG tablet Take 1 tablet (50 mg total) by mouth daily.  . nitroGLYCERIN (NITROSTAT) 0.4 MG SL tablet Place 1 tablet (0.4 mg total) under the tongue every 5 (five) minutes x 3 doses as needed for chest pain.  . ranitidine (ZANTAC) 300 MG tablet Take 0.5 tablets (150 mg total) by mouth 2 (two) times daily.  . rosuvastatin (CRESTOR) 10 MG tablet Take 1 tablet (10 mg total) by mouth daily.     Allergies:   Pineapple   Social History   Socioeconomic History  . Marital status: Widowed    Spouse name: Not on file  . Number of children: Not on file  . Years of education: Not on file  . Highest education level: Not on file  Occupational History  . Occupation: Retired  Scientific laboratory technician  . Financial resource strain: Not very hard  . Food insecurity:    Worry: Never true    Inability: Never true  . Transportation needs:  Medical: No    Non-medical: No  Tobacco Use  . Smoking status: Never Smoker  . Smokeless tobacco: Never Used  Substance and Sexual Activity  . Alcohol use: No  . Drug use: No  . Sexual activity: Never  Lifestyle  . Physical activity:    Days per week: 0 days    Minutes per session: 0 min  . Stress: Not at all  Relationships  . Social connections:    Talks on phone: Not on file    Gets together: Not on file    Attends religious service: Not on file    Active member of club or organization: Not on file    Attends meetings of clubs or organizations: Not on file    Relationship status: Not on file  Other Topics Concern  . Not on file  Social History Narrative   Lives alone, family helps in her care.     Family History: The patient's family history includes Arthritis  in her mother; Cancer in her maternal grandmother; Emphysema in her maternal uncle; Healthy in her daughter and son; Heart attack in her maternal aunt and mother; Heart disease in her mother and sister; Hypertension in her mother; Multiple myeloma in her maternal uncle; Renal Disease in her maternal aunt; Stroke in her father; Thyroid disease in her sister.  ROS:   Please see the history of present illness.    No complaints. All other systems reviewed and are negative.  EKGs/Labs/Other Studies Reviewed:    The following studies were reviewed today: Myocardial perfusion imaging August 25, 2018:  Study Highlights     Nuclear stress EF: 53%.  This is a low risk study.  The left ventricular ejection fraction is mildly decreased (45-54%).   1. EF 53%, mild diffuse hypokinesis.  2. No evidence for ischemia or infarction on perfusion images.     EKG:  EKG is none ordered today.    Recent Labs: 08/22/2018: B Natriuretic Peptide 237.0 08/23/2018: ALT 12; BUN 24; Creatinine, Ser 1.50; Hemoglobin 9.9; Magnesium 2.1; Platelets 286; Potassium 3.6; Sodium 143 09/19/2018: TSH 20.31  Recent Lipid Panel    Component Value Date/Time   CHOL 284 (H) 05/04/2018 1544   TRIG 69.0 05/04/2018 1544   HDL 70.60 05/04/2018 1544   CHOLHDL 4 05/04/2018 1544   VLDL 13.8 05/04/2018 1544   LDLCALC 200 (H) 05/04/2018 1544    Physical Exam:    VS:  BP (!) 142/70   Pulse 84   Ht '5\' 3"'$  (1.6 m)   Wt 170 lb (77.1 kg)   SpO2 99%   BMI 30.11 kg/m     Wt Readings from Last 3 Encounters:  10/28/18 170 lb (77.1 kg)  10/06/18 167 lb 12.8 oz (76.1 kg)  08/29/18 169 lb (76.7 kg)     GEN:  Well nourished, well developed in no acute distress HEENT: Normal NECK: No JVD. LYMPHATICS: No lymphadenopathy CARDIAC: RRR, no murmur, no gallop, no edema. VASCULAR: 2+ bilateral radial and carotid pulses.  No bruits. RESPIRATORY:  Clear to auscultation without rales, wheezing or rhonchi  ABDOMEN: Soft, non-tender,  non-distended, No pulsatile mass, MUSCULOSKELETAL: No deformity  SKIN: Warm and dry NEUROLOGIC:  Alert and oriented x 3 PSYCHIATRIC:  Normal affect   ASSESSMENT:    1. Chest pain with high risk for cardiac etiology   2. Essential hypertension, benign   3. CKD (chronic kidney disease), stage III (HCC)   4. Palpitations    PLAN:    In order of  problems listed above:  1. Chest pain is not felt to be ischemia related.  Recent nuclear study was unremarkable. 2. The blood pressure is adequately controlled for age.  Continue current therapy. 3. To be followed up by primary 4. To be followed up if becomes troublesome.  Clinical follow-up as needed.   Medication Adjustments/Labs and Tests Ordered: Current medicines are reviewed at length with the patient today.  Concerns regarding medicines are outlined above.  No orders of the defined types were placed in this encounter.  No orders of the defined types were placed in this encounter.   Patient Instructions  Medication Instructions:  Your physician recommends that you continue on your current medications as directed. Please refer to the Current Medication list given to you today.  If you need a refill on your cardiac medications before your next appointment, please call your pharmacy.   Lab work: None If you have labs (blood work) drawn today and your tests are completely normal, you will receive your results only by: Marland Kitchen MyChart Message (if you have MyChart) OR . A paper copy in the mail If you have any lab test that is abnormal or we need to change your treatment, we will call you to review the results.  Testing/Procedures: None  Follow-Up: Your physician recommends that you schedule a follow-up appointment as needed with Dr. Tamala Julian.   Any Other Special Instructions Will Be Listed Below (If Applicable).       Signed, Sinclair Grooms, MD  10/28/2018 4:12 PM    [facility]

## 2018-10-28 ENCOUNTER — Ambulatory Visit: Payer: Medicare HMO | Admitting: Interventional Cardiology

## 2018-10-28 ENCOUNTER — Encounter: Payer: Self-pay | Admitting: Interventional Cardiology

## 2018-10-28 VITALS — BP 142/70 | HR 84 | Ht 63.0 in | Wt 170.0 lb

## 2018-10-28 DIAGNOSIS — R002 Palpitations: Secondary | ICD-10-CM

## 2018-10-28 DIAGNOSIS — N183 Chronic kidney disease, stage 3 unspecified: Secondary | ICD-10-CM

## 2018-10-28 DIAGNOSIS — I10 Essential (primary) hypertension: Secondary | ICD-10-CM

## 2018-10-28 DIAGNOSIS — R079 Chest pain, unspecified: Secondary | ICD-10-CM

## 2018-10-28 NOTE — Patient Instructions (Signed)
Medication Instructions:  Your physician recommends that you continue on your current medications as directed. Please refer to the Current Medication list given to you today.  If you need a refill on your cardiac medications before your next appointment, please call your pharmacy.   Lab work: None If you have labs (blood work) drawn today and your tests are completely normal, you will receive your results only by: . MyChart Message (if you have MyChart) OR . A paper copy in the mail If you have any lab test that is abnormal or we need to change your treatment, we will call you to review the results.  Testing/Procedures: None  Follow-Up: Your physician recommends that you schedule a follow-up appointment as needed with Dr. Smith.   Any Other Special Instructions Will Be Listed Below (If Applicable).    

## 2018-11-06 NOTE — Progress Notes (Addendum)
Carolyn Myers at [facility] [address], [place], [place] [address] [phone]  Date:  11/07/2018   Name:  Carolyn Myers   DOB:  August 24, 1936   MRN:  [id number]  PCP:  Darreld Mclean, MD    Chief Complaint: Hypothyroidism (6 month follow up "taking 141mg" of levothyroxine-was told 200 mcg too much) and Hypertension   History of Present Illness:  Carolyn KRUPPis a 82y.o. very pleasant female patient who presents with the following:  I last saw her in July after she was admitted for CP At that time her TSH was quite high as she was not taking her thyroid replacement.  We started her back on 50 mcg- will need to check her TSH today  States today that she has been taking 1051m of thyroid over the last month or so -I am not sure exactly what dose she is on  Will check TSH for her today Accompanied by her daughter today who helps with the history   Lab Results  Component Value Date   TSH 20.31 (H) 09/19/2018   Recently saw her cardiologist Dr. SmTamala Julian1. Chest pain with high risk for cardiac etiology   2. Essential hypertension, benign   3. CKD (chronic kidney disease), stage III (HCC)   4. Palpitations    PLAN:    In order of problems listed above: 1. Chest pain is not felt to be ischemia related.  Recent nuclear study was unremarkable. 2. The blood pressure is adequately controlled for age.  Continue current therapy. 3. To be followed up by primary 4. To be followed up if becomes troublesome.  She does have a nephrologist Dr. PoFlorene GlenShe has not been in to see him in over a year however,and reports getting a letter that he was moving to a new practice  - I had not heard this but it may well be true.    She takes her hydralazine TID, losartan at night. Generally would take her amlodipine about 2 pm but did not do so today- this may be why her BP is elevated     Chemistry      Component Value Date/Time    NA 143 08/23/2018 0544   K 3.6 08/23/2018 0544   CL 112 (H) 08/23/2018 0544   CO2 23 08/23/2018 0544   BUN 24 (H) 08/23/2018 0544   CREATININE 1.50 (H) 08/23/2018 0544   CREATININE 1.33 (H) 03/13/2016 1149      Component Value Date/Time   CALCIUM 8.9 08/23/2018 0544   ALKPHOS 79 08/23/2018 0544   AST 17 08/23/2018 0544   ALT 12 08/23/2018 0544   BILITOT 0.8 08/23/2018 0544     BP Readings from Last 3 Encounters:  11/07/18 (!) 170/98  10/28/18 (!) 142/70  10/06/18 (!) 180/70    Patient Active Problem List   Diagnosis Date Noted  . Hypertensive urgency 08/23/2018  . Atypical chest pain 08/22/2018  . Sleep apnea   . Palpitations   . Migraine   . Hyperlipidemia   . History of gout   . History of blood transfusion   . Frequent headaches   . Diverticulitis   . Chicken pox   . Arthritis   . CKD (chronic kidney disease) stage 3, GFR 30-59 ml/min (HCC) 02/23/2017  . Age-related osteoporosis without current pathological fracture 02/23/2017  . Esophageal reflux 05/08/2016  . Renal artery stenosis ([facility]03/03/2016  . Hypothyroidism 02/22/2016  .  Chest pain with high risk for cardiac etiology 02/21/2016  . Gout   . SOB (shortness of breath) on exertion 10/06/2015  . Accelerated hypertension 10/06/2015  . Essential hypertension, benign 11/26/2013  . Hyperlipidemia LDL goal <100 11/26/2013    Past Medical History:  Diagnosis Date  . Arthritis    "minor in my shoulders" (02/21/2016)  . Chicken pox   . Diverticulitis   . Frequent headaches    "maybe twice/week" (03/12/2016)  . Gout    Right Foot  . History of blood transfusion    "said my blood was low"  . History of gout   . Hyperlipidemia   . Hypertension   . Hypothyroidism   . Migraine    "maybe twice/year" (02/21/2016)  . Palpitations    "doctor thought it was related to my thyroid"  . Sleep apnea    "I don't wear my mask" (02/21/2016)    Past Surgical History:  Procedure Laterality Date  . ABDOMINAL  HYSTERECTOMY     "for fibroid tumors"  . TUBAL LIGATION      Social History   Tobacco Use  . Smoking status: Never Smoker  . Smokeless tobacco: Never Used  Substance Use Topics  . Alcohol use: No  . Drug use: No    Family History  Problem Relation Age of Onset  . Heart disease Mother   . Heart attack Mother        died from it  . Hypertension Mother   . Arthritis Mother   . Stroke Father   . Cancer Maternal Grandmother   . Heart attack Maternal Aunt   . Renal Disease Maternal Aunt   . Multiple myeloma Maternal Uncle   . Emphysema Maternal Uncle   . Heart disease Sister   . Thyroid disease Sister   . Healthy Son        x3  . Healthy Daughter        x5    Allergies  Allergen Reactions  . Pineapple Swelling    Reaction to fresh pineapple - tongue swells, but breathing is not affected    Medication list has been reviewed and updated.  Current Outpatient Medications on File Prior to Visit  Medication Sig Dispense Refill  . acetaminophen (TYLENOL) 325 MG tablet Take 650 mg by mouth every 6 (six) hours as needed (for pain or headaches).     Marland Kitchen amLODipine (NORVASC) 10 MG tablet Take 1 tablet (10 mg total) by mouth daily. 90 tablet 3  . aspirin EC 81 MG EC tablet Take 1 tablet (81 mg total) by mouth daily.    . Carboxymethylcellulose Sodium ([employer] LUBRICANT EYE DROPS PF OP) Place 1-2 drops into both eyes 3 (three) times daily as needed (for dry eyes).    . hydrALAZINE (APRESOLINE) 100 MG tablet Take 1 tablet (100 mg total) by mouth 3 (three) times daily. Monitor your blood pressure, hold for BP 100/55 and pulse 55 90 tablet 0  . levothyroxine (SYNTHROID, LEVOTHROID) 75 MCG tablet Take 1 tablet (75 mcg total) by mouth daily before breakfast. 30 tablet 3  . losartan (COZAAR) 50 MG tablet Take 1 tablet (50 mg total) by mouth daily. 90 tablet 3  . nitroGLYCERIN (NITROSTAT) 0.4 MG SL tablet Place 1 tablet (0.4 mg total) under the tongue every 5 (five) minutes x 3 doses as needed  for chest pain. 25 tablet 5  . ranitidine (ZANTAC) 300 MG tablet Take 0.5 tablets (150 mg total) by mouth 2 (two) times daily.  90 tablet 3  . rosuvastatin (CRESTOR) 10 MG tablet Take 1 tablet (10 mg total) by mouth daily. 90 tablet 3   No current facility-administered medications on file prior to visit.     Review of Systems:  As per HPI- otherwise negative. No CP , no HA, no SOB Feels well today    Physical Examination: Vitals:   11/07/18 1529  BP: (!) 170/98  Pulse: 77  Resp: 16  Temp: 98.1 F (36.7 C)  SpO2: 98%   Vitals:   11/07/18 1529  Weight: 169 lb (76.7 kg)  Height: 5' 3" (1.6 m)   Body mass index is 29.94 kg/m. Ideal Body Weight: Weight in (lb) to have BMI = 25: 140.8  GEN: WDWN, NAD, Non-toxic, A & O x 3, overweight, looks well  HEENT: Atraumatic, Normocephalic. Neck supple. No masses, No LAD. Ears and Nose: No external deformity. CV: RRR, No M/G/R. No JVD. No thrill. No extra heart sounds. PULM: CTA B, no wheezes, crackles, rhonchi. No retractions. No resp. distress. No accessory muscle use. ABD: S, NT, ND EXTR: No c/c/e NEURO Normal gait.  PSYCH: Normally interactive. Conversant. Not depressed or anxious appearing.  Calm demeanor.    Assessment and Plan: Acquired hypothyroidism - Plan: TSH  Renal insufficiency - Plan: CBC  Hypertension, unspecified type - Plan: CBC  Renal artery stenosis (HCC) - Plan: Basic metabolic panel  Here today for a follow-up visit Uncertain of what synthroid dose she is taking- will check TSH and see where we are Renal function pending- may need to establish with a new nephrologist Her BP is up, but she did not take at least her amlodipine as she normally does today Also, no longer using a pill box.  Asked her daughter to help organize pill box weekly They will see me in 2 weeks to repeat her BP and they will bring in all her medications If BP still high can go up on losartan   Signed Lamar Blinks, MD  Received  her labs 11/19 Results for orders placed or performed in visit on 11/07/18  TSH  Result Value Ref Range   TSH 61.89 (H) 0.35 - 4.50 uIU/mL  CBC  Result Value Ref Range   WBC 6.9 4.0 - 10.5 K/uL   RBC 4.32 3.87 - 5.11 Mil/uL   Platelets 360.0 150.0 - 400.0 K/uL   Hemoglobin 11.5 (L) 12.0 - 15.0 g/dL   HCT 35.4 (L) 36.0 - 46.0 %   MCV 82.1 78.0 - 100.0 fl   MCHC 32.4 30.0 - 36.0 g/dL   RDW 15.6 (H) 11.5 - 23.5 %  Basic metabolic panel  Result Value Ref Range   Sodium 140 135 - 145 mEq/L   Potassium 3.6 3.5 - 5.1 mEq/L   Chloride 105 96 - 112 mEq/L   CO2 26 19 - 32 mEq/L   Glucose, Bld 73 70 - 99 mg/dL   BUN 24 (H) 6 - 23 mg/dL   Creatinine, Ser 1.45 (H) 0.40 - 1.20 mg/dL   Calcium 9.4 8.4 - 10.5 mg/dL   GFR 44.67 (L) >60.00 mL/min   Called her daughter and LMOM- can she go and see what dose of thyroid her mom is actually taking?  I will call her back later

## 2018-11-07 ENCOUNTER — Ambulatory Visit (INDEPENDENT_AMBULATORY_CARE_PROVIDER_SITE_OTHER): Payer: Medicare HMO | Admitting: Family Medicine

## 2018-11-07 ENCOUNTER — Encounter: Payer: Self-pay | Admitting: Family Medicine

## 2018-11-07 VITALS — BP 162/98 | HR 77 | Temp 98.1°F | Resp 16 | Ht 63.0 in | Wt 169.0 lb

## 2018-11-07 DIAGNOSIS — I701 Atherosclerosis of renal artery: Secondary | ICD-10-CM | POA: Diagnosis not present

## 2018-11-07 DIAGNOSIS — E039 Hypothyroidism, unspecified: Secondary | ICD-10-CM

## 2018-11-07 DIAGNOSIS — I10 Essential (primary) hypertension: Secondary | ICD-10-CM

## 2018-11-07 DIAGNOSIS — N289 Disorder of kidney and ureter, unspecified: Secondary | ICD-10-CM | POA: Diagnosis not present

## 2018-11-07 NOTE — Patient Instructions (Addendum)
Please come in to see me in about 2 weeks for a BP check- if your BP is still high at that time we might need to change your medications I will be in touch with your labs from today Please bring all your medications to your next visit with you  Try some docusate sodium OTC -one a day - for constipation

## 2018-11-08 LAB — CBC
HEMATOCRIT: 35.4 % — AB (ref 36.0–46.0)
HEMOGLOBIN: 11.5 g/dL — AB (ref 12.0–15.0)
MCHC: 32.4 g/dL (ref 30.0–36.0)
MCV: 82.1 fl (ref 78.0–100.0)
Platelets: 360 10*3/uL (ref 150.0–400.0)
RBC: 4.32 Mil/uL (ref 3.87–5.11)
RDW: 15.6 % — AB (ref 11.5–15.5)
WBC: 6.9 10*3/uL (ref 4.0–10.5)

## 2018-11-08 LAB — BASIC METABOLIC PANEL
BUN: 24 mg/dL — ABNORMAL HIGH (ref 6–23)
CALCIUM: 9.4 mg/dL (ref 8.4–10.5)
CO2: 26 mEq/L (ref 19–32)
CREATININE: 1.45 mg/dL — AB (ref 0.40–1.20)
Chloride: 105 mEq/L (ref 96–112)
GFR: 44.67 mL/min — AB (ref >60.00)
GLUCOSE: 73 mg/dL (ref 70–99)
Potassium: 3.6 mEq/L (ref 3.5–5.1)
SODIUM: 140 meq/L (ref 135–145)

## 2018-11-08 LAB — TSH: TSH: 61.89 u[IU]/mL — ABNORMAL HIGH (ref 0.35–4.50)

## 2018-11-17 NOTE — Progress Notes (Signed)
[place] at [facility] [address], [place], [place] [address] [phone]  Date:  11/21/2018   Name:  Carolyn Myers   DOB:  09-20-1936   MRN:  [id number]  PCP:  Darreld Mclean, MD    Chief Complaint: Hypertension (follow up) and Medication Management (ranitidine dose? , able to throw un-used medications away)   History of Present Illness:  Carolyn Myers is a 82 y.o. very pleasant female patient who presents with the following:  Following up today to go over her medications and check BP I saw her just recently and I had asked her to come in for a BP check, and also to look at her medications Last TSH was quite high- I had called daughter a few times to try and find out what dose of levothyroxine he was taking but had not heard back yet   Today they brought in all meds She is taking 75 mg of levothyroxine as it turns out She also has a bottle of 50 mcg that she can use to increase to a total of 100 mg  Her BP looks good today She is feeling ok and has no acute concerns Recent labs so improvement of her renal function and blood counts  She has mild, stable anemia going back to 2014 Results for orders placed or performed in visit on 11/07/18  TSH  Result Value Ref Range   TSH 61.89 (H) 0.35 - 4.50 uIU/mL  CBC  Result Value Ref Range   WBC 6.9 4.0 - 10.5 K/uL   RBC 4.32 3.87 - 5.11 Mil/uL   Platelets 360.0 150.0 - 400.0 K/uL   Hemoglobin 11.5 (L) 12.0 - 15.0 g/dL   HCT 35.4 (L) 36.0 - 46.0 %   MCV 82.1 78.0 - 100.0 fl   MCHC 32.4 30.0 - 36.0 g/dL   RDW 15.6 (H) 11.5 - 24.2 %  Basic metabolic panel  Result Value Ref Range   Sodium 140 135 - 145 mEq/L   Potassium 3.6 3.5 - 5.1 mEq/L   Chloride 105 96 - 112 mEq/L   CO2 26 19 - 32 mEq/L   Glucose, Bld 73 70 - 99 mg/dL   BUN 24 (H) 6 - 23 mg/dL   Creatinine, Ser 1.45 (H) 0.40 - 1.20 mg/dL   Calcium 9.4 8.4 - 10.5 mg/dL   GFR 44.67 (L) >60.00 mL/min      Lab Results  Component Value Date   TSH 61.89 (H) 11/07/2018     Patient Active Problem List   Diagnosis Date Noted  . Hypertensive urgency 08/23/2018  . Atypical chest pain 08/22/2018  . Sleep apnea   . Palpitations   . Migraine   . Hyperlipidemia   . History of gout   . History of blood transfusion   . Frequent headaches   . Diverticulitis   . Chicken pox   . Arthritis   . CKD (chronic kidney disease) stage 3, GFR 30-59 ml/min (HCC) 02/23/2017  . Age-related osteoporosis without current pathological fracture 02/23/2017  . Esophageal reflux 05/08/2016  . Renal artery stenosis ([place]) 02/22/2016  . Hypothyroidism 02/22/2016  . Chest pain with high risk for cardiac etiology 02/21/2016  . Gout   . SOB (shortness of breath) on exertion 10/06/2015  . Accelerated hypertension 10/06/2015  . Essential hypertension, benign 11/26/2013  . Hyperlipidemia LDL goal <100 11/26/2013    Past Medical History:  Diagnosis Date  . Arthritis    "  minor in my shoulders" (02/21/2016)  . Chicken pox   . Diverticulitis   . Frequent headaches    "maybe twice/week" (03/12/2016)  . Gout    Right Foot  . History of blood transfusion    "said my blood was low"  . History of gout   . Hyperlipidemia   . Hypertension   . Hypothyroidism   . Migraine    "maybe twice/year" (02/21/2016)  . Palpitations    "doctor thought it was related to my thyroid"  . Sleep apnea    "I don't wear my mask" (02/21/2016)    Past Surgical History:  Procedure Laterality Date  . ABDOMINAL HYSTERECTOMY     "for fibroid tumors"  . TUBAL LIGATION      Social History   Tobacco Use  . Smoking status: Never Smoker  . Smokeless tobacco: Never Used  Substance Use Topics  . Alcohol use: No  . Drug use: No    Family History  Problem Relation Age of Onset  . Heart disease Mother   . Heart attack Mother        died from it  . Hypertension Mother   . Arthritis Mother   . Stroke Father   . Cancer Maternal  Grandmother   . Heart attack Maternal Aunt   . Renal Disease Maternal Aunt   . Multiple myeloma Maternal Uncle   . Emphysema Maternal Uncle   . Heart disease Sister   . Thyroid disease Sister   . Healthy Son        x3  . Healthy Daughter        x5    Allergies  Allergen Reactions  . Pineapple Swelling    Reaction to fresh pineapple - tongue swells, but breathing is not affected    Medication list has been reviewed and updated.  Current Outpatient Medications on File Prior to Visit  Medication Sig Dispense Refill  . amLODipine (NORVASC) 10 MG tablet Take 1 tablet (10 mg total) by mouth daily. 90 tablet 3  . aspirin EC 81 MG EC tablet Take 1 tablet (81 mg total) by mouth daily.    . Carboxymethylcellulose Sodium ([employer]S LUBRICANT EYE DROPS PF OP) Place 1-2 drops into both eyes 3 (three) times daily as needed (for dry eyes).    . hydrALAZINE (APRESOLINE) 100 MG tablet Take 1 tablet (100 mg total) by mouth 3 (three) times daily. Monitor your blood pressure, hold for BP 100/55 and pulse 55 90 tablet 0  . levothyroxine (SYNTHROID, LEVOTHROID) 75 MCG tablet Take 1 tablet (75 mcg total) by mouth daily before breakfast. 30 tablet 3  . losartan (COZAAR) 50 MG tablet Take 1 tablet (50 mg total) by mouth daily. 90 tablet 3  . nitroGLYCERIN (NITROSTAT) 0.4 MG SL tablet Place 1 tablet (0.4 mg total) under the tongue every 5 (five) minutes x 3 doses as needed for chest pain. 25 tablet 5  . ranitidine (ZANTAC) 300 MG tablet Take 0.5 tablets (150 mg total) by mouth 2 (two) times daily. 90 tablet 3  . rosuvastatin (CRESTOR) 10 MG tablet Take 1 tablet (10 mg total) by mouth daily. 90 tablet 3   No current facility-administered medications on file prior to visit.     Review of Systems:  As per HPI- otherwise negative.   Physical Examination: Vitals:   11/21/18 1533 11/21/18 1544  BP: 136/70 125/62  Pulse: 71   Resp: 16   SpO2: 98%    Vitals:   11/21/18 1533  Weight: 165 lb (74.8 kg)   Height: '5\' 3"'$  (1.6 m)   Body mass index is 29.23 kg/m. Ideal Body Weight: Weight in (lb) to have BMI = 25: 140.8  GEN: WDWN, NAD, Non-toxic, A & O x 3, overweight, looks well  HEENT: Atraumatic, Normocephalic. Neck supple. No masses, No LAD. Ears and Nose: No external deformity. CV: RRR, No M/G/R. No JVD. No thrill. No extra heart sounds. PULM: CTA B, no wheezes, crackles, rhonchi. No retractions. No resp. distress. No accessory muscle use. EXTR: No c/c/e NEURO Normal gait.  PSYCH: Normally interactive. Conversant. Not depressed or anxious appearing.  Calm demeanor.    Assessment and Plan: Acquired hypothyroidism - Plan: TSH  Renal insufficiency  Hypertension, unspecified type  Increase to 126mg of levothyroxine by taking 2 of the 50 mcg she has on hand Repeat TSH in one month  Then will plan next visit with her   Signed JLamar Blinks MD

## 2018-11-21 ENCOUNTER — Ambulatory Visit (INDEPENDENT_AMBULATORY_CARE_PROVIDER_SITE_OTHER): Payer: Medicare HMO | Admitting: Family Medicine

## 2018-11-21 ENCOUNTER — Encounter: Payer: Self-pay | Admitting: Family Medicine

## 2018-11-21 VITALS — BP 125/62 | HR 71 | Resp 16 | Ht 63.0 in | Wt 165.0 lb

## 2018-11-21 DIAGNOSIS — N289 Disorder of kidney and ureter, unspecified: Secondary | ICD-10-CM | POA: Diagnosis not present

## 2018-11-21 DIAGNOSIS — E039 Hypothyroidism, unspecified: Secondary | ICD-10-CM

## 2018-11-21 DIAGNOSIS — I10 Essential (primary) hypertension: Secondary | ICD-10-CM

## 2018-11-21 NOTE — Patient Instructions (Signed)
Please take 2 of the 50 mcg levothyroxine that you have= 100 mcg daily Please come in for a TSH level only in about one month.  Otherwise all is looking ok!  Your recent labs showed improvement of your kidney function

## 2018-11-23 ENCOUNTER — Ambulatory Visit: Payer: Medicare HMO | Admitting: Family Medicine

## 2018-12-06 ENCOUNTER — Other Ambulatory Visit: Payer: Self-pay | Admitting: Family Medicine

## 2018-12-06 DIAGNOSIS — E039 Hypothyroidism, unspecified: Secondary | ICD-10-CM

## 2018-12-26 ENCOUNTER — Other Ambulatory Visit (INDEPENDENT_AMBULATORY_CARE_PROVIDER_SITE_OTHER): Payer: Medicare HMO

## 2018-12-26 DIAGNOSIS — E039 Hypothyroidism, unspecified: Secondary | ICD-10-CM

## 2018-12-26 NOTE — Addendum Note (Signed)
Addended by: Caffie Pinto on: 12/26/2018 03:18 PM   Modules accepted: Orders

## 2018-12-27 ENCOUNTER — Telehealth: Payer: Self-pay | Admitting: Family Medicine

## 2018-12-27 DIAGNOSIS — E039 Hypothyroidism, unspecified: Secondary | ICD-10-CM

## 2018-12-27 LAB — TSH: TSH: 5.75 u[IU]/mL — AB (ref 0.35–4.50)

## 2018-12-27 MED ORDER — LEVOTHYROXINE SODIUM 88 MCG PO TABS: 88.0000 ug | ORAL_TABLET | Freq: Every day | ORAL | 6 refills | Status: DC

## 2018-12-27 NOTE — Telephone Encounter (Signed)
Called pt and LMOM- her TSH is much better, but still not at goal on 75 mcg I am going to rx the next higher dose of thyroid med, 88 mcg  Please change to this dose and then come in for a TSH check in about 1 month. I will ask the same message with her daughter Vonda Antigua   Results for orders placed or performed in visit on 11/21/18  TSH  Result Value Ref Range   TSH 5.75 (H) 0.35 - 4.50 uIU/mL

## 2019-01-05 ENCOUNTER — Telehealth: Payer: Self-pay | Admitting: *Deleted

## 2019-01-05 NOTE — Telephone Encounter (Signed)
Received Physician Orders/[id number] Request for Independent Assessment for PCS, Attestation of Medical Need from [place]; forwarded to provider/SLS 01/16

## 2019-01-09 ENCOUNTER — Telehealth: Payer: Self-pay | Admitting: Family Medicine

## 2019-01-09 NOTE — Telephone Encounter (Signed)
Received paperwork from a home health company for this patient.  I have called her daughter twice to try to ask a few questions for clarification.  Have not been able to reach her, but I did speak with the home health agency who helped me complete the forms.  Will fax them in today

## 2019-01-30 ENCOUNTER — Other Ambulatory Visit (INDEPENDENT_AMBULATORY_CARE_PROVIDER_SITE_OTHER): Payer: Medicare HMO

## 2019-01-30 DIAGNOSIS — E039 Hypothyroidism, unspecified: Secondary | ICD-10-CM | POA: Diagnosis not present

## 2019-01-31 ENCOUNTER — Encounter: Payer: Self-pay | Admitting: Family Medicine

## 2019-01-31 LAB — TSH: TSH: 4.06 u[IU]/mL (ref 0.35–4.50)

## 2019-01-31 NOTE — Telephone Encounter (Signed)
Called pharmacy- she is on 88 mcg but someone filled an rx for 200 mcg as well, which she picked up yesterday  Called and left detailed message.  Throw away the 200 mcg and stay on 88 mcg.  Her recent TSH was in range

## 2019-05-10 ENCOUNTER — Telehealth: Payer: Self-pay | Admitting: Family Medicine

## 2019-05-10 MED ORDER — ROSUVASTATIN CALCIUM 10 MG PO TABS: 10.0000 mg | ORAL_TABLET | Freq: Every day | ORAL | 1 refills | Status: DC

## 2019-05-10 MED ORDER — HYDRALAZINE HCL 100 MG PO TABS: 100.0000 mg | ORAL_TABLET | Freq: Three times a day (TID) | ORAL | 1 refills | Status: DC

## 2019-05-10 NOTE — Telephone Encounter (Signed)
Refilled to pharmacy.

## 2019-05-10 NOTE — Telephone Encounter (Signed)
Copied from [place] [id number]. Topic: Quick Communication - Rx Refill/Question >> May 10, 2019 12:50 PM Wynetta Emery, [place] C wrote: Medication:   rosuvastatin (CRESTOR) 10 MG tablet  hydrALAZINE (APRESOLINE) 100 MG tablet   Has the patient contacted their pharmacy? Yes  (Agent: If no, request that the patient contact the pharmacy for the refill.) (Agent: If yes, when and what did the pharmacy advise?)  Preferred Pharmacy (with phone number or street name): [id number] [place], [place] [phone] (Phone) [phone] (Fax)    Agent: Please be advised that RX refills may take up to 3 business days. We ask that you follow-up with your pharmacy.

## 2019-06-22 ENCOUNTER — Other Ambulatory Visit: Payer: Self-pay | Admitting: Family Medicine

## 2019-06-22 DIAGNOSIS — E039 Hypothyroidism, unspecified: Secondary | ICD-10-CM

## 2019-06-22 MED ORDER — LEVOTHYROXINE SODIUM 88 MCG PO TABS: 88.0000 ug | ORAL_TABLET | Freq: Every day | ORAL | 0 refills | Status: DC

## 2019-06-22 NOTE — Telephone Encounter (Signed)
Patient would like a refill on her levothyroxine (SYNTHROID, LEVOTHROID) 88 MCG tablet medication and have it sent to [facility] preferred [facility] [address]

## 2019-06-22 NOTE — Telephone Encounter (Signed)
Refills sent

## 2019-06-30 ENCOUNTER — Telehealth: Payer: Self-pay

## 2019-06-30 NOTE — Telephone Encounter (Signed)
Copied from [place] [id number]. Topic: General - Other >> Jun 28, 2019 10:09 AM Yvette Rack wrote: Reason for CRM: Pt stated she needs an assessment form to be completed and sent to [facility] for her to get a home health aid. Pt provided phone# [phone] as contact # for [facility]

## 2019-07-03 NOTE — Telephone Encounter (Signed)
Called this number, for "[employer]" She needs a medicaid form sent to [facility] - however we don't think she actually has medicaid- she has Medicare  The attendant at [facility] will call her back and try to figure out what she needs

## 2019-07-05 ENCOUNTER — Telehealth: Payer: Self-pay | Admitting: Family Medicine

## 2019-07-05 NOTE — Telephone Encounter (Signed)
Please advise 

## 2019-07-05 NOTE — Telephone Encounter (Signed)
Carolyn Myers is calling and pt is trying to get home health aid assisting with  adl's and iadl's. Please complete  Medicaid form [id number] and fax to [facility] [phone] or [phone] . Please go on line to get form.

## 2019-07-07 NOTE — Telephone Encounter (Signed)
I have form.  However pt needs to be seen within 90 days for me to complete.  Please call and set her up for a visit so we can do her paperwork - thank you!

## 2019-07-13 NOTE — Telephone Encounter (Signed)
Left message for patient to return call and make an appointment to see Dr. Lorelei Pont to have the forms filled out.

## 2019-07-31 ENCOUNTER — Encounter: Payer: Self-pay | Admitting: Family Medicine

## 2019-08-01 NOTE — Progress Notes (Addendum)
[place] at [facility] [address], [place], [place] [address] [phone]  Date:  08/03/2019   Name:  Carolyn Myers   DOB:  [birth year]   MRN:  [id number]  PCP:  Darreld Mclean, MD    Chief Complaint: Form Fill out and Dizziness (refill on meclizine)   History of Present Illness:  Carolyn Myers is a 83 y.o. very pleasant female patient who presents with the following:  Following up today- she needs forms completed for home aid She would like help with bathing, food prep/cooking, cleaning, and getting dressed  Last seen by myself in December History of HTN, hypothyroidism, hyperlipidemia, renal artery stenosis Due for complete labs today She saw her cardiologist, Dr. Daneen Schick, in [place] changes made at that time  Today she notes good days and bad days She had some vertigo yesterday ; this is not new to her, occurs periodically she requests a refill of meclizine to use as needed She feels ok today-vertigo is resolved  Tetanus: She is due, but no wound is present.  Medicare will not cover Too soon for shingrix  She would like DEXA scan, mammogram- will set up for her  Lab Results  Component Value Date   TSH 4.06 01/30/2019  no falls in the last year  She can walk about 100 feet No assistive devices used at home-no cane or walker She does not have a WC She does have a shower chair She does live alone but her daughter is nearby She does not drive  Her daughter Carolyn Myers is here today and contributes to the history    Patient Active Problem List   Diagnosis Date Noted  . Hypertensive urgency 08/23/2018  . Atypical chest pain 08/22/2018  . Sleep apnea   . Palpitations   . Migraine   . Hyperlipidemia   . History of gout   . History of blood transfusion   . Frequent headaches   . Diverticulitis   . Chicken pox   . Arthritis   . CKD (chronic kidney disease) stage 3, GFR 30-59 ml/min (HCC)  02/23/2017  . Age-related osteoporosis without current pathological fracture 02/23/2017  . Esophageal reflux 05/08/2016  . Renal artery stenosis ([place]) 02/22/2016  . Hypothyroidism 02/22/2016  . Chest pain with high risk for cardiac etiology 02/21/2016  . Gout   . SOB (shortness of breath) on exertion 10/06/2015  . Accelerated hypertension 10/06/2015  . Essential hypertension, benign 11/26/2013  . Hyperlipidemia LDL goal <100 11/26/2013    Past Medical History:  Diagnosis Date  . Arthritis    "minor in my shoulders" (02/21/2016)  . Chicken pox   . Diverticulitis   . Frequent headaches    "maybe twice/week" (03/12/2016)  . Gout    Right Foot  . History of blood transfusion    "said my blood was low"  . History of gout   . Hyperlipidemia   . Hypertension   . Hypothyroidism   . Migraine    "maybe twice/year" (02/21/2016)  . Palpitations    "doctor thought it was related to my thyroid"  . Sleep apnea    "I don't wear my mask" (02/21/2016)    Past Surgical History:  Procedure Laterality Date  . ABDOMINAL HYSTERECTOMY     "for fibroid tumors"  . TUBAL LIGATION      Social History   Tobacco Use  . Smoking status: Never Smoker  . Smokeless tobacco: Never Used  Substance Use Topics  . Alcohol use: No  . Drug use: No    Family History  Problem Relation Age of Onset  . Heart disease Mother   . Heart attack Mother        died from it  . Hypertension Mother   . Arthritis Mother   . Stroke Father   . Cancer Maternal Grandmother   . Heart attack Maternal Aunt   . Renal Disease Maternal Aunt   . Multiple myeloma Maternal Uncle   . Emphysema Maternal Uncle   . Heart disease Sister   . Thyroid disease Sister   . Healthy Son        x3  . Healthy Daughter        x5    Allergies  Allergen Reactions  . Pineapple Swelling    Reaction to fresh pineapple - tongue swells, but breathing is not affected    Medication list has been reviewed and updated.  Current  Outpatient Medications on File Prior to Visit  Medication Sig Dispense Refill  . amLODipine (NORVASC) 10 MG tablet Take 1 tablet (10 mg total) by mouth daily. 90 tablet 3  . aspirin EC 81 MG EC tablet Take 1 tablet (81 mg total) by mouth daily.    . Carboxymethylcellulose Sodium (CVS LUBRICANT EYE DROPS PF OP) Place 1-2 drops into both eyes 3 (three) times daily as needed (for dry eyes).    . hydrALAZINE (APRESOLINE) 100 MG tablet Take 1 tablet (100 mg total) by mouth 3 (three) times daily. Monitor your blood pressure, hold for BP 100/55 and pulse 55 270 tablet 1  . levothyroxine (SYNTHROID) 88 MCG tablet Take 1 tablet (88 mcg total) by mouth daily before breakfast. 90 tablet 0  . losartan (COZAAR) 50 MG tablet Take 1 tablet (50 mg total) by mouth daily. 90 tablet 3  . nitroGLYCERIN (NITROSTAT) 0.4 MG SL tablet Place 1 tablet (0.4 mg total) under the tongue every 5 (five) minutes x 3 doses as needed for chest pain. 25 tablet 5  . ranitidine (ZANTAC) 300 MG tablet Take 0.5 tablets (150 mg total) by mouth 2 (two) times daily. 90 tablet 3  . rosuvastatin (CRESTOR) 10 MG tablet Take 1 tablet (10 mg total) by mouth daily. 90 tablet 1   No current facility-administered medications on file prior to visit.     Review of Systems:  As per HPI- otherwise negative.   Physical Examination: Vitals:   08/03/19 0914 08/03/19 0935  BP: (!) 158/70 130/70  Pulse: 74   Resp: 16   Temp: (!) 97.1 F (36.2 C)   SpO2: 97%    Vitals:   08/03/19 0914  Weight: 170 lb (77.1 kg)  Height: '5\' 3"'$  (1.6 m)   Body mass index is 30.11 kg/m. Ideal Body Weight: Weight in (lb) to have BMI = 25: 140.8  GEN: WDWN, NAD, Non-toxic, A & O x 3, elderly lady who looks well HEENT: Atraumatic, Normocephalic. Neck supple. No masses, No LAD. Ears and Nose: No external deformity. CV: RRR, No M/G/R. No JVD. No thrill. No extra heart sounds. PULM: CTA B, no wheezes, crackles, rhonchi. No retractions. No resp. distress. No  accessory muscle use. ABD: S, NT, ND EXTR: No c/c/e NEURO slow gait, gait apraxia c/w advanced age PSYCH: Normally interactive. Conversant. Not depressed or anxious appearing.  Calm demeanor.    Assessment and Plan:   ICD-10-CM   1. Hypertension, unspecified type  I10 CANCELED: CBC    CANCELED: Comprehensive  metabolic panel  2. Renal insufficiency  N28.9 CBC    Comprehensive metabolic panel    CANCELED: Comprehensive metabolic panel  3. Acquired hypothyroidism  E03.9 TSH    CANCELED: TSH  4. Renal artery stenosis (HCC)  I70.1   5. Hyperglycemia  R73.9 Hemoglobin A1c    Comprehensive metabolic panel  6. Mixed hyperlipidemia  E78.2 Lipid panel    CANCELED: Lipid panel  7. Estrogen deficiency  E28.39 DG Bone Density  8. Screening for breast cancer  Z12.39 MM 3D SCREEN BREAST BILATERAL  9. Vertigo  R42 meclizine (ANTIVERT) 12.5 MG tablet  10. 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  Cautioned this can cause sedation  Follow-up: No follow-ups on file.  Meds ordered this encounter  Medications  . meclizine (ANTIVERT) 12.5 MG tablet    Sig: Take 1 tablet (12.5 mg total) by mouth 3 (three) times daily as needed for dizziness.    Dispense:  30 tablet    Refill:  0   Orders Placed This Encounter  Procedures  . DG Bone Density  . MM 3D SCREEN BREAST BILATERAL  . Hemoglobin A1c  . CBC  . Comprehensive metabolic panel  . TSH  . Lipid panel    '@SIGN'$ @    Signed Lamar Blinks, MD  Received her labs as below, 8/15  Message to patient  Results for orders placed or performed in visit on 08/03/19  Hemoglobin A1c  Result Value Ref Range   Hgb A1c MFr Bld 5.3 4.6 - 6.5 %  CBC  Result Value Ref  Range   WBC 7.4 4.0 - 10.5 K/uL   RBC 4.03 3.87 - 5.11 Mil/uL   Platelets 298.0 150.0 - 400.0 K/uL   Hemoglobin 11.3 (L) 12.0 - 15.0 g/dL   HCT 34.6 (L) 36.0 - 46.0 %   MCV 85.7 78.0 - 100.0 fl   MCHC 32.5 30.0 - 36.0 g/dL   RDW 15.0 11.5 - 15.5 %  Comprehensive metabolic panel  Result Value Ref Range   Sodium 142 135 - 145 mEq/L   Potassium 4.0 3.5 - 5.1 mEq/L   Chloride 108 96 - 112 mEq/L   CO2 20 19 - 32 mEq/L   Glucose, Bld 102 (H) 70 - 99 mg/dL   BUN 25 (H) 6 - 23 mg/dL   Creatinine, Ser 1.70 (H) 0.40 - 1.20 mg/dL   Total Bilirubin 0.3 0.2 - 1.2 mg/dL   Alkaline Phosphatase 73 39 - 117 U/L   AST 12 0 - 37 U/L   ALT 6 0 - 35 U/L   Total Protein 7.2 6.0 - 8.3 g/dL   Albumin 4.3 3.5 - 5.2 g/dL   Calcium 9.4 8.4 - 10.5 mg/dL   GFR 34.91 (L) >60.00 mL/min  TSH  Result Value Ref Range   TSH 65.66 (H) 0.35 - 4.50 uIU/mL  Lipid panel  Result Value Ref Range   Cholesterol 233 (H) 0 - 200 mg/dL   Triglycerides 96.0 0.0 - 149.0 mg/dL   HDL 70.50 >39.00 mg/dL   VLDL 19.2 0.0 - 40.0 mg/dL   LDL Cholesterol 143 (H) 0 - 99 mg/dL   Total CHOL/HDL Ratio 3    NonHDL 162.69     Your  hemoglobin A1c looks fine, no evidence of diabetes Cholesterol is reasonable, continue Crestor Your TSH is quite high, indicating that your body is asking for more thyroid hormone.  Have you been taking your Synthroid regularly?  If so, I will need to increase your dose-please just reply to me here and let me know  Your kidney function is fluctuated to the worse again. I am not quite sure when you last saw your kidney doctor, as we do not she had the same computer system.  As a reminder, you were seeing Dr. Florene Glen and his phone number is [phone].  Please give his office a call and set up an appointment if you have not seen him in the last 6 to 9 months  Please just let me know about your thyroid, otherwise we can visit in 6 months Take care

## 2019-08-03 ENCOUNTER — Ambulatory Visit (INDEPENDENT_AMBULATORY_CARE_PROVIDER_SITE_OTHER): Payer: Medicare HMO | Admitting: Family Medicine

## 2019-08-03 ENCOUNTER — Encounter: Payer: Self-pay | Admitting: Family Medicine

## 2019-08-03 ENCOUNTER — Other Ambulatory Visit: Payer: Self-pay

## 2019-08-03 VITALS — BP 130/70 | HR 74 | Temp 97.1°F | Resp 16 | Ht 63.0 in | Wt 170.0 lb

## 2019-08-03 DIAGNOSIS — N289 Disorder of kidney and ureter, unspecified: Secondary | ICD-10-CM

## 2019-08-03 DIAGNOSIS — E039 Hypothyroidism, unspecified: Secondary | ICD-10-CM

## 2019-08-03 DIAGNOSIS — R54 Age-related physical debility: Secondary | ICD-10-CM

## 2019-08-03 DIAGNOSIS — I701 Atherosclerosis of renal artery: Secondary | ICD-10-CM | POA: Diagnosis not present

## 2019-08-03 DIAGNOSIS — Z1239 Encounter for other screening for malignant neoplasm of breast: Secondary | ICD-10-CM

## 2019-08-03 DIAGNOSIS — E2839 Other primary ovarian failure: Secondary | ICD-10-CM

## 2019-08-03 DIAGNOSIS — E782 Mixed hyperlipidemia: Secondary | ICD-10-CM | POA: Diagnosis not present

## 2019-08-03 DIAGNOSIS — I10 Essential (primary) hypertension: Secondary | ICD-10-CM

## 2019-08-03 DIAGNOSIS — R739 Hyperglycemia, unspecified: Secondary | ICD-10-CM

## 2019-08-03 DIAGNOSIS — R42 Dizziness and giddiness: Secondary | ICD-10-CM

## 2019-08-03 DIAGNOSIS — R482 Apraxia: Secondary | ICD-10-CM

## 2019-08-03 LAB — LIPID PANEL
Cholesterol: 233 mg/dL — ABNORMAL HIGH (ref 0–200)
HDL: 70.5 mg/dL (ref >39.00)
LDL Cholesterol: 143 mg/dL — ABNORMAL HIGH (ref 0–99)
NonHDL: 162.69
Total CHOL/HDL Ratio: 3
Triglycerides: 96 mg/dL (ref 0.0–149.0)
VLDL: 19.2 mg/dL (ref 0.0–40.0)

## 2019-08-03 LAB — CBC
HCT: 34.6 % — ABNORMAL LOW (ref 36.0–46.0)
Hemoglobin: 11.3 g/dL — ABNORMAL LOW (ref 12.0–15.0)
MCHC: 32.5 g/dL (ref 30.0–36.0)
MCV: 85.7 fl (ref 78.0–100.0)
Platelets: 298 10*3/uL (ref 150.0–400.0)
RBC: 4.03 Mil/uL (ref 3.87–5.11)
RDW: 15 % (ref 11.5–15.5)
WBC: 7.4 10*3/uL (ref 4.0–10.5)

## 2019-08-03 LAB — COMPREHENSIVE METABOLIC PANEL
ALT: 6 U/L (ref 0–35)
AST: 12 U/L (ref 0–37)
Albumin: 4.3 g/dL (ref 3.5–5.2)
Alkaline Phosphatase: 73 U/L (ref 39–117)
BUN: 25 mg/dL — ABNORMAL HIGH (ref 6–23)
CO2: 20 mEq/L (ref 19–32)
Calcium: 9.4 mg/dL (ref 8.4–10.5)
Chloride: 108 mEq/L (ref 96–112)
Creatinine, Ser: 1.7 mg/dL — ABNORMAL HIGH (ref 0.40–1.20)
GFR: 34.91 mL/min — ABNORMAL LOW (ref >60.00)
Glucose, Bld: 102 mg/dL — ABNORMAL HIGH (ref 70–99)
Potassium: 4 mEq/L (ref 3.5–5.1)
Sodium: 142 mEq/L (ref 135–145)
Total Bilirubin: 0.3 mg/dL (ref 0.2–1.2)
Total Protein: 7.2 g/dL (ref 6.0–8.3)

## 2019-08-03 LAB — HEMOGLOBIN A1C: Hgb A1c MFr Bld: 5.3 % (ref 4.6–6.5)

## 2019-08-03 LAB — TSH: TSH: 65.66 u[IU]/mL — ABNORMAL HIGH (ref 0.35–4.50)

## 2019-08-03 MED ORDER — MECLIZINE HCL 12.5 MG PO TABS: 12.5000 mg | ORAL_TABLET | Freq: Three times a day (TID) | ORAL | 0 refills | Status: DC | PRN

## 2019-08-03 NOTE — Patient Instructions (Addendum)
It was nice to see you today, I will be in touch with your labs Assuming your labs are okay, let us plan to visit in about 6 months I ordered a bone density scan and mammogram for you We will complete forms for your home aid application  You can use meclizine as needed for vertigo- however remember this can cause drowsiness Please get your flu shot this fall, and a tetanus vaccine if any wound

## 2019-08-05 ENCOUNTER — Encounter: Payer: Self-pay | Admitting: Family Medicine

## 2019-08-16 ENCOUNTER — Encounter: Payer: Self-pay | Admitting: Family Medicine

## 2019-08-16 ENCOUNTER — Telehealth: Payer: Self-pay

## 2019-08-16 NOTE — Telephone Encounter (Signed)
Copied from [place] [id number]. Topic: Appointment Scheduling - Scheduling Inquiry for Clinic >> Aug 16, 2019 10:00 AM Oneta Rack wrote: Patient would like to schedule flu shot for 09/05/2019 due to patient have her Bone Density and [place] done, flu schedule is not open please follow up with the patient regarding scheduling.

## 2019-08-16 NOTE — Telephone Encounter (Signed)
error:[id number] ° °

## 2019-08-18 NOTE — Telephone Encounter (Signed)
Can I just schedule patient for nv flu shot? She just had appointment with you a few days ago. Please advise on ammo and dexa.

## 2019-08-18 NOTE — Telephone Encounter (Signed)
Scheduled for patient on 9/15 for flu shot

## 2019-09-05 ENCOUNTER — Other Ambulatory Visit: Payer: Self-pay

## 2019-09-05 ENCOUNTER — Ambulatory Visit (HOSPITAL_BASED_OUTPATIENT_CLINIC_OR_DEPARTMENT_OTHER)
Admission: RE | Admit: 2019-09-05 | Discharge: 2019-09-05 | Disposition: A | Discharge status: Home / Self Care | Payer: Medicare HMO | Source: Ambulatory Visit | Attending: Family Medicine | Admitting: Family Medicine

## 2019-09-05 ENCOUNTER — Encounter: Payer: Self-pay | Admitting: Family Medicine

## 2019-09-05 ENCOUNTER — Ambulatory Visit (INDEPENDENT_AMBULATORY_CARE_PROVIDER_SITE_OTHER): Payer: Medicare HMO

## 2019-09-05 ENCOUNTER — Encounter (HOSPITAL_BASED_OUTPATIENT_CLINIC_OR_DEPARTMENT_OTHER): Payer: Self-pay

## 2019-09-05 DIAGNOSIS — E2839 Other primary ovarian failure: Secondary | ICD-10-CM

## 2019-09-05 DIAGNOSIS — M8588 Other specified disorders of bone density and structure, other site: Secondary | ICD-10-CM | POA: Diagnosis not present

## 2019-09-05 DIAGNOSIS — M810 Age-related osteoporosis without current pathological fracture: Secondary | ICD-10-CM | POA: Diagnosis not present

## 2019-09-05 DIAGNOSIS — E039 Hypothyroidism, unspecified: Secondary | ICD-10-CM | POA: Insufficient documentation

## 2019-09-05 DIAGNOSIS — Z1239 Encounter for other screening for malignant neoplasm of breast: Secondary | ICD-10-CM

## 2019-09-05 DIAGNOSIS — Z1231 Encounter for screening mammogram for malignant neoplasm of breast: Secondary | ICD-10-CM | POA: Diagnosis not present

## 2019-09-05 DIAGNOSIS — Z13820 Encounter for screening for osteoporosis: Secondary | ICD-10-CM | POA: Insufficient documentation

## 2019-09-05 DIAGNOSIS — Z780 Asymptomatic menopausal state: Secondary | ICD-10-CM | POA: Insufficient documentation

## 2019-09-05 DIAGNOSIS — Z23 Encounter for immunization: Secondary | ICD-10-CM

## 2019-09-06 ENCOUNTER — Other Ambulatory Visit: Payer: Self-pay | Admitting: Family Medicine

## 2019-09-06 DIAGNOSIS — R928 Other abnormal and inconclusive findings on diagnostic imaging of breast: Secondary | ICD-10-CM

## 2019-09-17 ENCOUNTER — Other Ambulatory Visit: Payer: Self-pay | Admitting: Family Medicine

## 2019-09-17 DIAGNOSIS — E039 Hypothyroidism, unspecified: Secondary | ICD-10-CM

## 2019-11-05 ENCOUNTER — Other Ambulatory Visit: Payer: Self-pay | Admitting: Family Medicine

## 2019-11-14 ENCOUNTER — Telehealth: Payer: Self-pay

## 2019-11-14 NOTE — Telephone Encounter (Signed)
Sent patient mychart message

## 2019-11-14 NOTE — Telephone Encounter (Signed)
Copied from [place] [id number]. Topic: General - Other >> Nov 13, 2019  3:12 PM Celene Kras wrote: Reason for CRM: Pt called and is requesting to have a medication sent in to help her with her back pain. Please advise.    [id number] [place] [place] [address] Phone: [phone] Fax: [phone] Not a 24 hour pharmacy; exact hours not known.

## 2019-11-20 ENCOUNTER — Encounter: Payer: Self-pay | Admitting: Family Medicine

## 2019-11-20 ENCOUNTER — Ambulatory Visit (INDEPENDENT_AMBULATORY_CARE_PROVIDER_SITE_OTHER): Payer: Medicare HMO | Admitting: Family Medicine

## 2019-11-20 DIAGNOSIS — M545 Low back pain, unspecified: Secondary | ICD-10-CM

## 2019-11-20 NOTE — Progress Notes (Signed)
[place] at [facility] [address], [place], [place] [address] [phone]  Date:  11/20/2019   Name:  Carolyn Myers   DOB:  1936/05/19   MRN:  [id number]  PCP:  Darreld Mclean, MD    Chief Complaint: No chief complaint on file.   History of Present Illness:  Carolyn Myers is a 83 y.o. very pleasant female patient who presents with the following:  Pt with history of HTN, hypothyroidism, hyperlipidemia, renal artery stenosis Pt ID confirmed with 2 factors She gives consent for virtual visit today Telephone visit today for concern of back pain which she has noted for about one week She thought it was "just the cold weather" but it has not gone away She notes pain in her bilateral hips and lower back- can be in just one side or in both sides  She tried taking some tylenol- this did not really help that much She is taking 2 to 500 mg Tylenol twice a day  She notes that she had a similar episode about 35 years ago and turned out to be "a kidney infection"  She urinates about 3x a day- no change here No pain with urination No hematuria No fever or chills No flu like symptoms   BP Readings from Last 3 Encounters:  08/03/19 130/70  11/21/18 125/62  11/07/18 (!) 162/98   She notes that tylenol will temporarily relieve her pain but not for long She has significant renal disease and cannot generally use NSAIDs   Patient Active Problem List   Diagnosis Date Noted  . Osteoporosis 09/05/2019  . Frail elderly 08/03/2019  . 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 Hypertensive urgency 08/23/2018  . Atypical chest pain 08/22/2018  . Sleep apnea   . Palpitations   . Migraine   . Hyperlipidemia   . History of gout   . History of blood transfusion   . Frequent headaches   . Diverticulitis   . Chicken pox   . Arthritis   . CKD (chronic kidney disease) stage 3, GFR 30-59 ml/min (HCC) 02/23/2017  . Age-related  osteoporosis without current pathological fracture 02/23/2017  . Esophageal reflux 05/08/2016  . Renal artery stenosis ([place]) 02/22/2016  . Hypothyroidism 02/22/2016  . Chest pain with high risk for cardiac etiology 02/21/2016  . Gout   . SOB (shortness of breath) on exertion 10/06/2015  . Accelerated hypertension 10/06/2015  . Essential hypertension, benign 11/26/2013  . Hyperlipidemia LDL goal <100 11/26/2013    Past Medical History:  Diagnosis Date  . Arthritis    "minor in my shoulders" (02/21/2016)  . Chicken pox   . Diverticulitis   . Frequent headaches    "maybe twice/week" (03/12/2016)  . Gout    Right Foot  . History of blood transfusion    "said my blood was low"  . History of gout   . Hyperlipidemia   . Hypertension   . Hypothyroidism   . Migraine    "maybe twice/year" (02/21/2016)  . Palpitations    "doctor thought it was related to my thyroid"  . Sleep apnea    "I don't wear my mask" (02/21/2016)    Past Surgical History:  Procedure Laterality Date  . ABDOMINAL HYSTERECTOMY     "for fibroid tumors"  . TUBAL LIGATION      Social History   Tobacco Use  . Smoking status: Never Smoker  . Smokeless tobacco: Never Used  Substance Use Topics  . Alcohol use: No  . Drug use: No    Family History  Problem Relation Age of Onset  . Heart disease Mother   . Heart attack Mother        died from it  . Hypertension Mother   . Arthritis Mother   . Stroke Father   . Cancer Maternal Grandmother   . Heart attack Maternal Aunt   . Renal Disease Maternal Aunt   . Multiple myeloma Maternal Uncle   . Emphysema Maternal Uncle   . Heart disease Sister   . Thyroid disease Sister   . Healthy Son        x3  . Healthy Daughter        x5    Allergies  Allergen Reactions  . Pineapple Swelling    Reaction to fresh pineapple - tongue swells, but breathing is not affected    Medication list has been reviewed and updated.  Current Outpatient Medications on File  Prior to Visit  Medication Sig Dispense Refill  . amLODipine (NORVASC) 10 MG tablet Take 1 tablet (10 mg total) by mouth daily. 90 tablet 3  . aspirin EC 81 MG EC tablet Take 1 tablet (81 mg total) by mouth daily.    . Carboxymethylcellulose Sodium ([employer] LUBRICANT EYE DROPS PF OP) Place 1-2 drops into both eyes 3 (three) times daily as needed (for dry eyes).    . hydrALAZINE (APRESOLINE) 100 MG tablet TAKE 1 TABLET BY MOUTH 3 TIMES DAILY. MONITOR YOUR BLOOD PRESSURE, HOLD FOR BP 100/55 AND PULSE 55 270 tablet 1  . levothyroxine (SYNTHROID) 88 MCG tablet TAKE 1 TABLET BY MOUTH DAILY BEFORE BREAKFAST. 90 tablet 1  . losartan (COZAAR) 50 MG tablet Take 1 tablet (50 mg total) by mouth daily. 90 tablet 3  . meclizine (ANTIVERT) 12.5 MG tablet Take 1 tablet (12.5 mg total) by mouth 3 (three) times daily as needed for dizziness. 30 tablet 0  . nitroGLYCERIN (NITROSTAT) 0.4 MG SL tablet Place 1 tablet (0.4 mg total) under the tongue every 5 (five) minutes x 3 doses as needed for chest pain. 25 tablet 5  . ranitidine (ZANTAC) 300 MG tablet Take 0.5 tablets (150 mg total) by mouth 2 (two) times daily. 90 tablet 3  . rosuvastatin (CRESTOR) 10 MG tablet TAKE 1 TABLET BY MOUTH EVERY DAY 90 tablet 1   No current facility-administered medications on file prior to visit.     Review of Systems:  As per HPI- otherwise negative.   Physical Examination: There were no vitals filed for this visit. There were no vitals filed for this visit. There is no height or weight on file to calculate BMI. Ideal Body Weight:    Spoke to patient on the telephone.  She sounds well, no cough, wheezing, shortness of breath or distress is noted  Assessment and Plan: Acute bilateral low back pain, unspecified whether sciatica present  Older lady for virtual visit today due to back pain.  She notes history of? pyelonephritis many years ago, which may have presented in a similar way.  Otherwise her symptoms do not generally  suggest pyelonephritis.  In any case, I think she needs to be seen in the office.  I offered to have her seen tomorrow by one of my partners, but she really cannot come until Wednesday which is when she can get a ride from her daughter.  I made her an appointment for this coming Wednesday.  At that time we  can do an exam and also check her urine.  In the meantime advised her about max doses of Tylenol, and also encouraged her to try heat and perhaps a muscle rubs such as icy hot or Biofreeze  She will contact me if any worsening in the meantime Spoke with pt for 9:15 on the telephone today  Signed Lamar Blinks, MD

## 2019-11-21 NOTE — Progress Notes (Addendum)
[place] at [facility] [place], [place], [place] [address] [phone]  Date:  11/22/2019   Name:  ERNESTENE COOVER   DOB:  26-Mar-1936   MRN:  [id number]  PCP:  Darreld Mclean, MD    Chief Complaint: Shoulder Pain (right shoulder pain, no known injury)   History of Present Illness:  Carolyn Myers is a 83 y.o. very pleasant female patient who presents with the following:  Here today for an in person visit-I spoke with patient on the telephone on Monday, she complained of back pain and wondered if she could have a kidney infection so we arranged for her to come in  History of sleep apnea, renal artery stenosis, osteoporosis, hypothyroidism, hyperlipidemia, hypertension, gout, chronic renal disease She does not currently see nephrology- suggest referral again today She has seen Dr Florene Glen in the past -last visit about 2 years ago in chart  Needs TSH, BMP, urine evaluation  No dysuria noted, no urinary frequency or hematuria  Today her concerns are not urinary but MSK pains in her right side She notes pain in her right shoulder, right hip and lower back for about one week NKI, no fall recently She is careful not to fall as she is afraid of getting hurt  She has tried tylenol so far for her pain NSAIDS not ideal due to her renal disease     Patient Active Problem List   Diagnosis Date Noted  . Osteoporosis 09/05/2019  . Frail elderly 08/03/2019  . 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 Hypertensive urgency 08/23/2018  . Atypical chest pain 08/22/2018  . Sleep apnea   . Palpitations   . Migraine   . Hyperlipidemia   . History of gout   . History of blood transfusion   . Frequent headaches   . Diverticulitis   . Chicken pox   . Arthritis   . CKD (chronic kidney disease) stage 3, GFR 30-59 ml/min (HCC) 02/23/2017  . Age-related osteoporosis without current pathological fracture 02/23/2017  . Esophageal  reflux 05/08/2016  . Renal artery stenosis ([place]) 02/22/2016  . Hypothyroidism 02/22/2016  . Chest pain with high risk for cardiac etiology 02/21/2016  . Gout   . SOB (shortness of breath) on exertion 10/06/2015  . Accelerated hypertension 10/06/2015  . Essential hypertension, benign 11/26/2013  . Hyperlipidemia LDL goal <100 11/26/2013    Past Medical History:  Diagnosis Date  . Arthritis    "minor in my shoulders" (02/21/2016)  . Chicken pox   . Diverticulitis   . Frequent headaches    "maybe twice/week" (03/12/2016)  . Gout    Right Foot  . History of blood transfusion    "said my blood was low"  . History of gout   . Hyperlipidemia   . Hypertension   . Hypothyroidism   . Migraine    "maybe twice/year" (02/21/2016)  . Palpitations    "doctor thought it was related to my thyroid"  . Sleep apnea    "I don't wear my mask" (02/21/2016)    Past Surgical History:  Procedure Laterality Date  . ABDOMINAL HYSTERECTOMY     "for fibroid tumors"  . TUBAL LIGATION      Social History   Tobacco Use  . Smoking status: Never Smoker  . Smokeless tobacco: Never Used  Substance Use Topics  . Alcohol use: No  . Drug use: No    Family History  Problem Relation  Age of Onset  . Heart disease Mother   . Heart attack Mother        died from it  . Hypertension Mother   . Arthritis Mother   . Stroke Father   . Cancer Maternal Grandmother   . Heart attack Maternal Aunt   . Renal Disease Maternal Aunt   . Multiple myeloma Maternal Uncle   . Emphysema Maternal Uncle   . Heart disease Sister   . Thyroid disease Sister   . Healthy Son        x3  . Healthy Daughter        x5    Allergies  Allergen Reactions  . Pineapple Swelling    Reaction to fresh pineapple - tongue swells, but breathing is not affected    Medication list has been reviewed and updated.  Current Outpatient Medications on File Prior to Visit  Medication Sig Dispense Refill  . amLODipine (NORVASC) 10 MG  tablet Take 1 tablet (10 mg total) by mouth daily. 90 tablet 3  . aspirin EC 81 MG EC tablet Take 1 tablet (81 mg total) by mouth daily.    . Carboxymethylcellulose Sodium ([employer] LUBRICANT EYE DROPS PF OP) Place 1-2 drops into both eyes 3 (three) times daily as needed (for dry eyes).    . hydrALAZINE (APRESOLINE) 100 MG tablet TAKE 1 TABLET BY MOUTH 3 TIMES DAILY. MONITOR YOUR BLOOD PRESSURE, HOLD FOR BP 100/55 AND PULSE 55 270 tablet 1  . levothyroxine (SYNTHROID) 88 MCG tablet TAKE 1 TABLET BY MOUTH DAILY BEFORE BREAKFAST. 90 tablet 1  . losartan (COZAAR) 50 MG tablet Take 1 tablet (50 mg total) by mouth daily. 90 tablet 3  . meclizine (ANTIVERT) 12.5 MG tablet Take 1 tablet (12.5 mg total) by mouth 3 (three) times daily as needed for dizziness. 30 tablet 0  . nitroGLYCERIN (NITROSTAT) 0.4 MG SL tablet Place 1 tablet (0.4 mg total) under the tongue every 5 (five) minutes x 3 doses as needed for chest pain. 25 tablet 5  . ranitidine (ZANTAC) 300 MG tablet Take 0.5 tablets (150 mg total) by mouth 2 (two) times daily. 90 tablet 3  . rosuvastatin (CRESTOR) 10 MG tablet TAKE 1 TABLET BY MOUTH EVERY DAY 90 tablet 1   No current facility-administered medications on file prior to visit.     Review of Systems:  As per HPI- otherwise negative. No fever or chills   Physical Examination: Vitals:   11/22/19 1557 11/22/19 1753  BP: (!) 162/88 (!) 142/95  Pulse: 74   Resp: 18   Temp: (!) 96.8 F (36 C)   SpO2: 98%    Vitals:   11/22/19 1557  Weight: 167 lb (75.8 kg)  Height: _0  (1.6 m)   Body mass index is 29.58 kg/m. Ideal Body Weight: Weight in (lb) to have BMI = 25: 140.8  GEN: WDWN, NAD, Non-toxic, A & O x 3, overweight, looks well  HEENT: Atraumatic, Normocephalic. Neck supple. No masses, No LAD. Ears and Nose: No external deformity. CV: RRR, No M/G/R. No JVD. No thrill. No extra heart sounds. PULM: CTA B, no wheezes, crackles, rhonchi. No retractions. No resp. distress. No  accessory muscle use. ABD: S, NT, ND, benign belly. No rebound. No HSM. EXTR: No c/c/e NEURO Normal gait.  PSYCH: Normally interactive. Conversant. Not depressed or anxious appearing.  Calm demeanor.  Right shoulder shows good ROM without any severe discomfort.  Normal BUE strength and sensation Both hips with normal ROM for  age, no pain She notes tenderness in the muscles of her lower back, more so on the right  No CVA tenderness    Assessment and Plan: Renal insufficiency - Plan: Basic metabolic panel  Hyperglycemia  Hypertension, unspecified type  Mixed hyperlipidemia  Acquired hypothyroidism - Plan: TSH  Dysuria - Plan: Urine culture, POCT urinalysis dipstick, cephALEXin (KEFLEX) 500 MG capsule  Chronic right shoulder pain - Plan: diclofenac Sodium (VOLTAREN) 1 % GEL  Here today with a couple of concerns.   Geni Bers had thought she had a UTI- sx no longer apparent but her urine is suspicious Will start on keflex while urine culture is pending  She today has concern of MSK pain- cannot use oral nsaids very much due to kidneys rx for voltaren gel today Will plan further follow- up pending labs.  This visit occurred during the SARS-CoV-2 public health emergency.  Safety protocols were in place, including screening questions prior to the visit, additional usage of staff PPE, and extensive cleaning of exam room while observing appropriate contact time as indicated for disinfecting solutions.    Signed Lamar Blinks, MD  Received her labs 12/3-await urine culture and will contact her   Results for orders placed or performed in visit on 11/22/19  Urine culture   Specimen: Blood  Result Value Ref Range   MICRO NUMBER: [id number]    SPECIMEN QUALITY: Adequate    Sample Source BLOOD    STATUS: FINAL    ISOLATE 1: Escherichia coli (A)       Susceptibility   Escherichia coli - URINE CULTURE, REFLEX    AMOX/CLAVULANIC <=2 Sensitive     AMPICILLIN >=32 Resistant      AMPICILLIN/SULBACTAM 16 Intermediate     CEFAZOLIN* <=4 Not Reportable      * For infections other than uncomplicated UTIcaused by E. coli, K. pneumoniae or P. mirabilis:Cefazolin is resistant if MIC > or = 8 mcg/mL.(Distinguishing susceptible versus intermediatefor isolates with MIC < or = 4 mcg/mL requiresadditional testing.)For uncomplicated UTI caused by E. coli,K. pneumoniae or P. mirabilis: Cefazolin issusceptible if MIC <32 mcg/mL and predictssusceptible to the oral agents cefaclor, cefdinir,cefpodoxime, cefprozil, cefuroxime, cephalexinand loracarbef.    CEFEPIME <=1 Sensitive     CEFTRIAXONE <=1 Sensitive     CIPROFLOXACIN >=4 Resistant     LEVOFLOXACIN >=8 Resistant     ERTAPENEM <=0.5 Sensitive     GENTAMICIN <=1 Sensitive     IMIPENEM <=0.25 Sensitive     NITROFURANTOIN <=16 Sensitive     PIP/TAZO <=4 Sensitive     TOBRAMYCIN <=1 Sensitive     TRIMETH/SULFA* <=20 Sensitive      * For infections other than uncomplicated UTIcaused by E. coli, K. pneumoniae or P. mirabilis:Cefazolin is resistant if MIC > or = 8 mcg/mL.(Distinguishing susceptible versus intermediatefor isolates with MIC < or = 4 mcg/mL requiresadditional testing.)For uncomplicated UTI caused by E. coli,K. pneumoniae or P. mirabilis: Cefazolin issusceptible if MIC <32 mcg/mL and predictssusceptible to the oral agents cefaclor, cefdinir,cefpodoxime, cefprozil, cefuroxime, cephalexinand loracarbef.Legend:S = Susceptible  I = IntermediateR = Resistant  NS = Not susceptible* = Not tested  NR = Not reported**NN = See antimicrobic comments  Basic metabolic panel  Result Value Ref Range   Sodium 140 135 - 145 mEq/L   Potassium 3.7 3.5 - 5.1 mEq/L   Chloride 106 96 - 112 mEq/L   CO2 23 19 - 32 mEq/L   Glucose, Bld 104 (H) 70 - 99 mg/dL   BUN 29 (H) 6 -  23 mg/dL   Creatinine, Ser 1.93 (H) 0.40 - 1.20 mg/dL   GFR 30.13 (L) >60.00 mL/min   Calcium 9.7 8.4 - 10.5 mg/dL  TSH  Result Value Ref Range   TSH 78.79 (H) 0.35 - 4.50  uIU/mL  POCT urinalysis dipstick  Result Value Ref Range   Color, UA yellow yellow   Clarity, UA cloudy (A) clear   Glucose, UA negative negative mg/dL   Bilirubin, UA negative negative   Ketones, POC UA negative negative mg/dL   Spec Grav, UA 1.020 1.010 - 1.025   Blood, UA negative negative   pH, UA 5.5 5.0 - 8.0   Protein Ur, POC =100 (A) negative mg/dL   Urobilinogen, UA 0.2 0.2 or 1.0 E.U./dL   Nitrite, UA Negative Negative   Leukocytes, UA Large (3+) (A) Negative   Received her urine culture 12/6-positive for E. coli UTI Keflex should clear up infection Message to patient

## 2019-11-22 ENCOUNTER — Encounter: Payer: Self-pay | Admitting: Family Medicine

## 2019-11-22 ENCOUNTER — Ambulatory Visit (INDEPENDENT_AMBULATORY_CARE_PROVIDER_SITE_OTHER): Payer: Medicare HMO | Admitting: Family Medicine

## 2019-11-22 ENCOUNTER — Other Ambulatory Visit: Payer: Self-pay

## 2019-11-22 VITALS — BP 142/95 | HR 74 | Temp 96.8°F | Resp 18 | Ht 63.0 in | Wt 167.0 lb

## 2019-11-22 DIAGNOSIS — R739 Hyperglycemia, unspecified: Secondary | ICD-10-CM

## 2019-11-22 DIAGNOSIS — M25511 Pain in right shoulder: Secondary | ICD-10-CM

## 2019-11-22 DIAGNOSIS — I10 Essential (primary) hypertension: Secondary | ICD-10-CM

## 2019-11-22 DIAGNOSIS — G8929 Other chronic pain: Secondary | ICD-10-CM | POA: Diagnosis not present

## 2019-11-22 DIAGNOSIS — R300 Dysuria: Secondary | ICD-10-CM | POA: Diagnosis not present

## 2019-11-22 DIAGNOSIS — E039 Hypothyroidism, unspecified: Secondary | ICD-10-CM | POA: Diagnosis not present

## 2019-11-22 DIAGNOSIS — E782 Mixed hyperlipidemia: Secondary | ICD-10-CM | POA: Diagnosis not present

## 2019-11-22 DIAGNOSIS — N289 Disorder of kidney and ureter, unspecified: Secondary | ICD-10-CM

## 2019-11-22 LAB — POCT URINALYSIS DIP (MANUAL ENTRY)
Bilirubin, UA: NEGATIVE
Blood, UA: NEGATIVE
Glucose, UA: NEGATIVE mg/dL
Ketones, POC UA: NEGATIVE mg/dL
Nitrite, UA: NEGATIVE
Protein Ur, POC: 100 mg/dL — AB
Spec Grav, UA: 1.02 (ref 1.010–1.025)
Urobilinogen, UA: 0.2 E.U./dL
pH, UA: 5.5 (ref 5.0–8.0)

## 2019-11-22 MED ORDER — CEPHALEXIN 500 MG PO CAPS: 500.0000 mg | ORAL_CAPSULE | Freq: Two times a day (BID) | ORAL | 0 refills | Status: DC

## 2019-11-22 MED ORDER — DICLOFENAC SODIUM 1 % EX GEL: 2.0000 g | Freq: Four times a day (QID) | CUTANEOUS | 3 refills | Status: AC

## 2019-11-22 NOTE — Patient Instructions (Addendum)
Good to see you today!  I think that you may have a urinary tract infection- your urine culture is pending We will start you on keflex antibiotic twice a day for one week in the meantime  I also prescribed voltarel gel for you to try on painful joints- this is a topical pain reliever   Please do be sure to see nephrology in the next 6 months or so for a follow-up visit

## 2019-11-23 LAB — BASIC METABOLIC PANEL
BUN: 29 mg/dL — ABNORMAL HIGH (ref 6–23)
CO2: 23 mEq/L (ref 19–32)
Calcium: 9.7 mg/dL (ref 8.4–10.5)
Chloride: 106 mEq/L (ref 96–112)
Creatinine, Ser: 1.93 mg/dL — ABNORMAL HIGH (ref 0.40–1.20)
GFR: 30.13 mL/min — ABNORMAL LOW (ref >60.00)
Glucose, Bld: 104 mg/dL — ABNORMAL HIGH (ref 70–99)
Potassium: 3.7 mEq/L (ref 3.5–5.1)
Sodium: 140 mEq/L (ref 135–145)

## 2019-11-23 LAB — TSH: TSH: 78.79 u[IU]/mL — ABNORMAL HIGH (ref 0.35–4.50)

## 2019-11-24 DIAGNOSIS — N3000 Acute cystitis without hematuria: Secondary | ICD-10-CM | POA: Diagnosis not present

## 2019-11-24 DIAGNOSIS — Z131 Encounter for screening for diabetes mellitus: Secondary | ICD-10-CM | POA: Diagnosis not present

## 2019-11-24 DIAGNOSIS — E7849 Other hyperlipidemia: Secondary | ICD-10-CM | POA: Diagnosis not present

## 2019-11-24 DIAGNOSIS — E039 Hypothyroidism, unspecified: Secondary | ICD-10-CM | POA: Diagnosis not present

## 2019-11-24 DIAGNOSIS — M79661 Pain in right lower leg: Secondary | ICD-10-CM | POA: Diagnosis not present

## 2019-11-24 DIAGNOSIS — T7840XA Allergy, unspecified, initial encounter: Secondary | ICD-10-CM | POA: Diagnosis not present

## 2019-11-24 DIAGNOSIS — I10 Essential (primary) hypertension: Secondary | ICD-10-CM | POA: Diagnosis not present

## 2019-11-24 LAB — URINE CULTURE
MICRO NUMBER:: 1155668
SPECIMEN QUALITY:: ADEQUATE

## 2019-11-26 ENCOUNTER — Encounter: Payer: Self-pay | Admitting: Family Medicine

## 2019-12-07 ENCOUNTER — Encounter: Payer: Self-pay | Admitting: Family Medicine

## 2019-12-07 ENCOUNTER — Other Ambulatory Visit: Payer: Self-pay | Admitting: Family Medicine

## 2019-12-07 DIAGNOSIS — R300 Dysuria: Secondary | ICD-10-CM

## 2019-12-07 NOTE — Telephone Encounter (Signed)
Requested medication (s) are due for refill today: no  Requested medication (s) are on the active medication list: yes  Last refill:  11/22/2019  Future visit scheduled: yes  Notes to clinic:  medication not to a protocol, review manually   Requested Prescriptions  Pending Prescriptions Disp Refills   cephALEXin (KEFLEX) 500 MG capsule 14 capsule 0    Sig: Take 1 capsule (500 mg total) by mouth 2 (two) times daily. For UTI      Off-Protocol Failed - 12/07/2019  9:10 AM      Failed - Medication not assigned to a protocol, review manually.      Passed - Valid encounter within last 12 months    Recent Outpatient Visits           2 weeks ago Renal insufficiency   [facility] [place], MD   2 weeks ago Acute bilateral low back pain, unspecified whether sciatica present   [facility] [place], MD   4 months ago Hypertension, unspecified type   [facility], Gay Filler, MD   1 year ago Acquired hypothyroidism   [facility] [place], Gay Filler, MD   1 year ago Acquired hypothyroidism   [facility] at [place], Gay Filler, MD       Future Appointments             In 2 months [place], Gay Filler, MD [facility] [place]

## 2019-12-07 NOTE — Telephone Encounter (Signed)
Medication Refill - Medication: cephALEXin (KEFLEX) 500 MG capsule  Has the patient contacted their pharmacy? no (Agent: If no, request that the patient contact the pharmacy for the refill.) (Agent: If yes, when and what did the pharmacy advise?)  Preferred Pharmacy (with phone number or street name):  [id number] - , [place] Phone:  [phone]  Fax:  [phone]     Agent: Please be advised that RX refills may take up to 3 business days. We ask that you follow-up with your pharmacy.

## 2019-12-27 ENCOUNTER — Telehealth: Payer: Self-pay | Admitting: *Deleted

## 2019-12-27 ENCOUNTER — Ambulatory Visit: Payer: Self-pay

## 2019-12-27 NOTE — Telephone Encounter (Signed)
Called her daughter back- her mom is complaining of 8/10 pain in her back and legs, and is crying from pain.  I advised that this is unusual, concerning, and I would advise her to be seen in the ER They decline this option. Request me to "call in something to take the pain away" but I advised that we need to find out what is wrong. Wanted to be seen on Friday- however I think this is too long to wait and we are expecting snow.  Made an appt for tomorrow afternoon

## 2019-12-27 NOTE — Telephone Encounter (Signed)
Pt. Has called back in regard to her back and bilateral leg pain. Has tried Tylenol and will apply a Salonpas patch.Spoke to [place] in the practice and the information has been sent to PCP. Please advise pt.  Answer Assessment - Initial Assessment Questions 1. ONSET: "When did the pain begin?"      Started today 2. LOCATION: "Where does it hurt?" (upper, mid or lower back)     Low back and both legs 3. SEVERITY: "How bad is the pain?"  (e.g., Scale 1-10; mild, moderate, or severe)   - MILD (1-3): doesn't interfere with normal activities    - MODERATE (4-7): interferes with normal activities or awakens from sleep    - SEVERE (8-10): excruciating pain, unable to do any normal activities      8 4. PATTERN: "Is the pain constant?" (e.g., yes, no; constant, intermittent)      Constant 5. RADIATION: "Does the pain shoot into your legs or elsewhere?"     No 6. CAUSE:  "What do you think is causing the back pain?"      Arthritis 7. BACK OVERUSE:  "Any recent lifting of heavy objects, strenuous work or exercise?"     No 8. MEDICATIONS: "What have you taken so far for the pain?" (e.g., nothing, acetaminophen, NSAIDS)     Tylenol 9. NEUROLOGIC SYMPTOMS: "Do you have any weakness, numbness, or problems with bowel/bladder control?"     Legs are weak 10. OTHER SYMPTOMS: "Do you have any other symptoms?" (e.g., fever, abdominal pain, burning with urination, blood in urine)       No 11. PREGNANCY: "Is there any chance you are pregnant?" (e.g., yes, no; LMP)       No  Protocols used: BACK PAIN-A-AH

## 2019-12-27 NOTE — Telephone Encounter (Signed)
Copied from [place] [id number]. Topic: General - Other >> Dec 27, 2019 12:24 PM Leward Quan A wrote: Reason for CRM: Patient daughter Carolyn Myers called to say that the patient is complaining of pain in her upper back between shoulders and back of her legs. States that the patient is not sleeping at night and that she is having excessive saliva in her mouth and also that she is not sleeping at night due to the fact that she can not get comfortable. She is asking if there is something that the patient can get to manage her pain and help her sleep at night. Asking for a call back at ph#  [phone] or [phone]

## 2019-12-28 ENCOUNTER — Ambulatory Visit (HOSPITAL_BASED_OUTPATIENT_CLINIC_OR_DEPARTMENT_OTHER)
Admission: RE | Admit: 2019-12-28 | Discharge: 2019-12-28 | Disposition: A | Discharge status: Home / Self Care | Payer: Medicare Other | Source: Ambulatory Visit | Attending: Family Medicine | Admitting: Family Medicine

## 2019-12-28 ENCOUNTER — Ambulatory Visit (INDEPENDENT_AMBULATORY_CARE_PROVIDER_SITE_OTHER): Payer: Medicare Other | Admitting: Family Medicine

## 2019-12-28 ENCOUNTER — Encounter: Payer: Self-pay | Admitting: Family Medicine

## 2019-12-28 ENCOUNTER — Other Ambulatory Visit: Payer: Self-pay

## 2019-12-28 VITALS — BP 126/60 | HR 71 | Temp 95.7°F | Resp 17 | Ht 63.0 in | Wt 164.0 lb

## 2019-12-28 DIAGNOSIS — M545 Low back pain, unspecified: Secondary | ICD-10-CM

## 2019-12-28 DIAGNOSIS — R300 Dysuria: Secondary | ICD-10-CM

## 2019-12-28 LAB — POCT URINALYSIS DIP (MANUAL ENTRY)
Blood, UA: NEGATIVE
Glucose, UA: NEGATIVE mg/dL
Nitrite, UA: NEGATIVE
Protein Ur, POC: 100 mg/dL — AB
Spec Grav, UA: 1.025 (ref 1.010–1.025)
Urobilinogen, UA: 0.2 E.U./dL
pH, UA: 5 (ref 5.0–8.0)

## 2019-12-28 MED ORDER — ACETAMINOPHEN-CODEINE #3 300-30 MG PO TABS: 0.5000 | ORAL_TABLET | Freq: Three times a day (TID) | ORAL | 0 refills | Status: DC | PRN

## 2019-12-28 MED ORDER — CEPHALEXIN 500 MG PO CAPS: 500.0000 mg | ORAL_CAPSULE | Freq: Two times a day (BID) | ORAL | 0 refills | Status: DC

## 2019-12-28 NOTE — Telephone Encounter (Signed)
Patient scheduled for appointment this afternoon with you.

## 2019-12-28 NOTE — Progress Notes (Addendum)
[place] [facility] [address] [place], [place] [address] [phone]  Date:  12/28/2019   Name:  Carolyn Myers   DOB:  [birth year]   MRN:  [id number]  PCP:  Darreld Mclean, MD    Chief Complaint: Urinary Tract Infection   History of Present Illness:  Carolyn Myers is a 84 y.o. very pleasant female patient who presents with the following:  In person follow-up visit today Patient with history of hypertension, renal artery stenosis/impaired renal function, migraine headache, sleep apnea, hypothyroidism, osteoarthritis, osteoporosis, Hyperlipidemia  Last seen by myself on December 2 with concern of back pain, she wondered if she might have a kidney infection.  However at that time her symptoms was more musculoskeletal pain in her right shoulder, right hip, and lower back She was taking Tylenol, avoid NSAIDs due to renal disease I prescribed Voltaren gel to use on the joints as needed Her urine culture at last visit did show an E. coli UTI which we treated with cephalexin Her GFR at that time was 30, I did encourage her to follow-up with her nephrologist ASAP  Family called yesterday, I spoke with her daughter Rollene Fare.  Patient has complaint of pain in her upper back, in the back of both legs.  She had complained of severe pain described as 8 out of 10, and was tearful.  I recommended emergency room evaluation but they declined at that time.  Made appointment for today Today she is seen with her daughter Vonda Antigua  Pt states that her back and buttocks were hurting yesterday and she was not sure why She started to notice this pain about a week ago No falls or other injury noted She does not have any vomiting, but notes that her appetite is not great No fever She has noted urinary frequency but she is drinking a lot of water  She has not scheduled with nephrology yet- will help them set this up  She is taking tylenol  for pain but does not feel like it is working She is also using some over-the-counter lidocaine patches   Patient Active Problem List   Diagnosis Date Noted  . Osteoporosis 09/05/2019  . Frail elderly 08/03/2019  . 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 Hypertensive urgency 08/23/2018  . Atypical chest pain 08/22/2018  . Sleep apnea   . Palpitations   . Migraine   . Hyperlipidemia   . History of gout   . History of blood transfusion   . Frequent headaches   . Diverticulitis   . Chicken pox   . Arthritis   . CKD (chronic kidney disease) stage 3, GFR 30-59 ml/min (HCC) 02/23/2017  . Age-related osteoporosis without current pathological fracture 02/23/2017  . Esophageal reflux 05/08/2016  . Renal artery stenosis ([place]) 02/22/2016  . Hypothyroidism 02/22/2016  . Chest pain with high risk for cardiac etiology 02/21/2016  . Gout   . SOB (shortness of breath) on exertion 10/06/2015  . Accelerated hypertension 10/06/2015  . Essential hypertension, benign 11/26/2013  . Hyperlipidemia LDL goal <100 11/26/2013    Past Medical History:  Diagnosis Date  . Arthritis    "minor in my shoulders" (02/21/2016)  . Chicken pox   . Diverticulitis   . Frequent headaches    "maybe twice/week" (03/12/2016)  . Gout    Right Foot  . History of blood transfusion    "said my blood was low"  . History of  gout   . Hyperlipidemia   . Hypertension   . Hypothyroidism   . Migraine    "maybe twice/year" (02/21/2016)  . Palpitations    "doctor thought it was related to my thyroid"  . Sleep apnea    "I don't wear my mask" (02/21/2016)    Past Surgical History:  Procedure Laterality Date  . ABDOMINAL HYSTERECTOMY     "for fibroid tumors"  . TUBAL LIGATION      Social History   Tobacco Use  . Smoking status: Never Smoker  . Smokeless tobacco: Never Used  Substance Use Topics  . Alcohol use: No  . Drug use: No    Family History  Problem Relation Age of Onset  . Heart disease Mother   .  Heart attack Mother        died from it  . Hypertension Mother   . Arthritis Mother   . Stroke Father   . Cancer Maternal Grandmother   . Heart attack Maternal Aunt   . Renal Disease Maternal Aunt   . Multiple myeloma Maternal Uncle   . Emphysema Maternal Uncle   . Heart disease Sister   . Thyroid disease Sister   . Healthy Son        x3  . Healthy Daughter        x5    Allergies  Allergen Reactions  . Pineapple Swelling    Reaction to fresh pineapple - tongue swells, but breathing is not affected    Medication list has been reviewed and updated.  Current Outpatient Medications on File Prior to Visit  Medication Sig Dispense Refill  . amLODipine (NORVASC) 10 MG tablet Take 1 tablet (10 mg total) by mouth daily. 90 tablet 3  . aspirin EC 81 MG EC tablet Take 1 tablet (81 mg total) by mouth daily.    . Carboxymethylcellulose Sodium ([employer] LUBRICANT EYE DROPS PF OP) Place 1-2 drops into both eyes 3 (three) times daily as needed (for dry eyes).    Marland Kitchen diclofenac Sodium (VOLTAREN) 1 % GEL Apply 2 g topically 4 (four) times daily. Use as needed for painful joints.  Max 32 grams per day 100 g 3  . hydrALAZINE (APRESOLINE) 100 MG tablet TAKE 1 TABLET BY MOUTH 3 TIMES DAILY. MONITOR YOUR BLOOD PRESSURE, HOLD FOR BP 100/55 AND PULSE 55 270 tablet 1  . levothyroxine (SYNTHROID) 88 MCG tablet TAKE 1 TABLET BY MOUTH DAILY BEFORE BREAKFAST. 90 tablet 1  . losartan (COZAAR) 50 MG tablet Take 1 tablet (50 mg total) by mouth daily. 90 tablet 3  . meclizine (ANTIVERT) 12.5 MG tablet Take 1 tablet (12.5 mg total) by mouth 3 (three) times daily as needed for dizziness. 30 tablet 0  . nitroGLYCERIN (NITROSTAT) 0.4 MG SL tablet Place 1 tablet (0.4 mg total) under the tongue every 5 (five) minutes x 3 doses as needed for chest pain. 25 tablet 5  . ranitidine (ZANTAC) 300 MG tablet Take 0.5 tablets (150 mg total) by mouth 2 (two) times daily. 90 tablet 3  . rosuvastatin (CRESTOR) 10 MG tablet TAKE 1 TABLET  BY MOUTH EVERY DAY 90 tablet 1   No current facility-administered medications on file prior to visit.    Review of Systems:  As per HPI- otherwise negative. No fever or chills, no chest pain or shortness of breath  Physical Examination: Vitals:   12/28/19 1547  BP: 126/60  Pulse: 71  Resp: 17  Temp: (!) 95.7 F (35.4 C)  SpO2: 98%  Vitals:   12/28/19 1547  Weight: 164 lb (74.4 kg)  Height: _0  (1.6 m)   Body mass index is 29.05 kg/m. Ideal Body Weight: Weight in (lb) to have BMI = 25: 140.8  GEN: WDWN, NAD, Non-toxic, A & O x 3, overweight, appears stated age or older HEENT: Atraumatic, Normocephalic. Neck supple. No masses, No LAD. Ears and Nose: No external deformity. CV: RRR, No M/G/R. No JVD. No thrill. No extra heart sounds. PULM: CTA B, no wheezes, crackles, rhonchi. No retractions. No resp. distress. No accessory muscle use. ABD: S, NT, ND. No rebound. No HSM.  Abdomen is benign EXTR: No c/c/e NEURO moving very slowly today, using rolling walker PSYCH: Normally interactive. Conversant. Not depressed or anxious appearing.  Calm demeanor.  Examined her back, buttocks, both legs.  No sign of infection, cellulitis, abscess.  Patient is not tender to palpation.  No swelling or redness Her lower extremitystrength seems normal for age bilaterally  Results for orders placed or performed in visit on 12/28/19  POCT urinalysis dipstick  Result Value Ref Range   Color, UA yellow yellow   Clarity, UA cloudy (A) clear   Glucose, UA negative negative mg/dL   Bilirubin, UA small (A) negative   Ketones, POC UA trace (5) (A) negative mg/dL   Spec Grav, UA 1.025 1.010 - 1.025   Blood, UA negative negative   pH, UA 5.0 5.0 - 8.0   Protein Ur, POC =100 (A) negative mg/dL   Urobilinogen, UA 0.2 0.2 or 1.0 E.U./dL   Nitrite, UA Negative Negative   Leukocytes, UA Large (3+) (A) Negative    Assessment and Plan: Low back pain, unspecified back pain laterality, unspecified  chronicity, unspecified whether sciatica present - Plan: Urine culture, POCT urinalysis dipstick, DG Lumbar Spine Complete, acetaminophen-codeine (TYLENOL #3) 300-30 MG tablet  Dysuria - Plan: cephALEXin (KEFLEX) 500 MG capsule  Here today with her daughter, with concern of pain in her bilateral back and legs.  Per patient and family, yesterday the pain was so severe that she was tearful.  Able to reproduce her pain on exam.  It may be that she has a urinary tract infection again, versus some other problem.  We will send urine culture but go ahead and start Keflex. We will also obtain plain films of lumbar spine The patient is asking me for medication to relieve the pain.  She is already using Tylenol, cannot use NSAIDs to any significant extent due to renal dysfunction, she has tried Voltaren gel and topical lidocaine.  I explained that narcotics need to be used with extreme caution her age group due to risk of falls.  Prescribed Tylenol 3 to use as needed with extreme caution  Meds ordered this encounter  Medications  . cephALEXin (KEFLEX) 500 MG capsule    Sig: Take 1 capsule (500 mg total) by mouth 2 (two) times daily. For UTI    Dispense:  14 capsule    Refill:  0  . acetaminophen-codeine (TYLENOL #3) 300-30 MG tablet    Sig: Take 0.5-1 tablets by mouth every 8 (eight) hours as needed for moderate pain.    Dispense:  20 tablet    Refill:  0     Signed Lamar Blinks, MD  Received urine culture 1/9- UTI again Keflex should be effective Message to pt Results for orders placed or performed in visit on 12/28/19  Urine culture   Specimen: Urine  Result Value Ref Range   MICRO NUMBER: [id number]  SPECIMEN QUALITY: Adequate    Sample Source NOT GIVEN    STATUS: FINAL    ISOLATE 1: Escherichia coli (A)       Susceptibility   Escherichia coli - URINE CULTURE, REFLEX    AMOX/CLAVULANIC <=2 Sensitive     AMPICILLIN 4 Sensitive     AMPICILLIN/SULBACTAM <=2 Sensitive     CEFAZOLIN*  <=4 Not Reportable      * For infections other than uncomplicated UTIcaused by E. coli, K. pneumoniae or P. mirabilis:Cefazolin is resistant if MIC > or = 8 mcg/mL.(Distinguishing susceptible versus intermediatefor isolates with MIC < or = 4 mcg/mL requiresadditional testing.)For uncomplicated UTI caused by E. coli,K. pneumoniae or P. mirabilis: Cefazolin issusceptible if MIC <32 mcg/mL and predictssusceptible to the oral agents cefaclor, cefdinir,cefpodoxime, cefprozil, cefuroxime, cephalexinand loracarbef.    CEFEPIME <=1 Sensitive     CEFTRIAXONE <=1 Sensitive     CIPROFLOXACIN >=4 Resistant     LEVOFLOXACIN >=8 Resistant     ERTAPENEM <=0.5 Sensitive     GENTAMICIN <=1 Sensitive     IMIPENEM <=0.25 Sensitive     NITROFURANTOIN <=16 Sensitive     PIP/TAZO <=4 Sensitive     TOBRAMYCIN <=1 Sensitive     TRIMETH/SULFA* <=20 Sensitive      * For infections other than uncomplicated UTIcaused by E. coli, K. pneumoniae or P. mirabilis:Cefazolin is resistant if MIC > or = 8 mcg/mL.(Distinguishing susceptible versus intermediatefor isolates with MIC < or = 4 mcg/mL requiresadditional testing.)For uncomplicated UTI caused by E. coli,K. pneumoniae or P. mirabilis: Cefazolin issusceptible if MIC <32 mcg/mL and predictssusceptible to the oral agents cefaclor, cefdinir,cefpodoxime, cefprozil, cefuroxime, cephalexinand loracarbef.Legend:S = Susceptible  I = IntermediateR = Resistant  NS = Not susceptible* = Not tested  NR = Not reported**NN = See antimicrobic comments  POCT urinalysis dipstick  Result Value Ref Range   Color, UA yellow yellow   Clarity, UA cloudy (A) clear   Glucose, UA negative negative mg/dL   Bilirubin, UA small (A) negative   Ketones, POC UA trace (5) (A) negative mg/dL   Spec Grav, UA 1.025 1.010 - 1.025   Blood, UA negative negative   pH, UA 5.0 5.0 - 8.0   Protein Ur, POC =100 (A) negative mg/dL   Urobilinogen, UA 0.2 0.2 or 1.0 E.U./dL   Nitrite, UA Negative Negative    Leukocytes, UA Large (3+) (A) Negative

## 2019-12-28 NOTE — Patient Instructions (Addendum)
Good to see you again today I will be in touch with your urine culture ASAP In the meantime, we are going to treat you with Keflex antibiotic twice a day for 1 week  Please stop by the ground floor radiology department to have x-rays of your lower back, then you can go home. You can continue to use the salonpas patches for your back pain.  I have also given you some Tylenol with codeine which you may use very cautiously.  This medication can cause sedation, please be careful when you are using it.  Please give your kidney doctor (nephrologist) or call.  You are patient at [facility].  The phone number there is [phone].  You had been seeing Dr. Florene Glen in the past Please let me know if you are getting worse or have any other concerns

## 2019-12-29 ENCOUNTER — Encounter: Payer: Self-pay | Admitting: Family Medicine

## 2019-12-30 ENCOUNTER — Encounter: Payer: Self-pay | Admitting: Family Medicine

## 2019-12-30 LAB — URINE CULTURE
MICRO NUMBER:: 10018079
SPECIMEN QUALITY:: ADEQUATE

## 2020-01-09 ENCOUNTER — Telehealth: Payer: Self-pay | Admitting: Family Medicine

## 2020-01-09 DIAGNOSIS — I701 Atherosclerosis of renal artery: Secondary | ICD-10-CM

## 2020-01-09 DIAGNOSIS — N183 Chronic kidney disease, stage 3 unspecified: Secondary | ICD-10-CM

## 2020-01-09 NOTE — Telephone Encounter (Signed)
She was actually seen in July 2018 so less than 3 years ago.  However I will place referral

## 2020-01-16 NOTE — Telephone Encounter (Signed)
Patient called stating that she no longer sees [place], her doctor there has retired.   Patient states that she needs a referral to see another Kidney Dr. Patient states she called in 3Weeks ago for Referral  and no response.  pls advise

## 2020-01-17 ENCOUNTER — Telehealth: Payer: Self-pay | Admitting: Family Medicine

## 2020-01-17 ENCOUNTER — Ambulatory Visit (INDEPENDENT_AMBULATORY_CARE_PROVIDER_SITE_OTHER): Payer: Medicare Other | Admitting: Family Medicine

## 2020-01-17 ENCOUNTER — Encounter: Payer: Self-pay | Admitting: Family Medicine

## 2020-01-17 ENCOUNTER — Ambulatory Visit (HOSPITAL_BASED_OUTPATIENT_CLINIC_OR_DEPARTMENT_OTHER)
Admission: RE | Admit: 2020-01-17 | Discharge: 2020-01-17 | Disposition: A | Discharge status: Home / Self Care | Payer: Medicare Other | Source: Ambulatory Visit | Attending: Family Medicine | Admitting: Family Medicine

## 2020-01-17 ENCOUNTER — Other Ambulatory Visit: Payer: Self-pay

## 2020-01-17 VITALS — BP 130/60 | HR 81 | Temp 96.7°F | Resp 15 | Ht 63.0 in | Wt 167.0 lb

## 2020-01-17 DIAGNOSIS — R110 Nausea: Secondary | ICD-10-CM | POA: Diagnosis not present

## 2020-01-17 DIAGNOSIS — R531 Weakness: Secondary | ICD-10-CM | POA: Diagnosis not present

## 2020-01-17 DIAGNOSIS — E039 Hypothyroidism, unspecified: Secondary | ICD-10-CM | POA: Diagnosis not present

## 2020-01-17 DIAGNOSIS — R2681 Unsteadiness on feet: Secondary | ICD-10-CM

## 2020-01-17 DIAGNOSIS — R300 Dysuria: Secondary | ICD-10-CM

## 2020-01-17 DIAGNOSIS — R54 Age-related physical debility: Secondary | ICD-10-CM

## 2020-01-17 DIAGNOSIS — N183 Chronic kidney disease, stage 3 unspecified: Secondary | ICD-10-CM

## 2020-01-17 DIAGNOSIS — Z79899 Other long term (current) drug therapy: Secondary | ICD-10-CM

## 2020-01-17 LAB — POCT URINALYSIS DIP (MANUAL ENTRY)
Bilirubin, UA: NEGATIVE
Blood, UA: NEGATIVE
Glucose, UA: NEGATIVE mg/dL
Nitrite, UA: POSITIVE — AB
Protein Ur, POC: 100 mg/dL — AB
Spec Grav, UA: 1.02 (ref 1.010–1.025)
Urobilinogen, UA: 0.2 E.U./dL
pH, UA: 6 (ref 5.0–8.0)

## 2020-01-17 MED ORDER — AMOXICILLIN-POT CLAVULANATE 500-125 MG PO TABS: 1.0000 | ORAL_TABLET | Freq: Two times a day (BID) | ORAL | 0 refills | Status: DC

## 2020-01-17 MED ORDER — ONDANSETRON HCL 4 MG PO TABS: 4.0000 mg | ORAL_TABLET | Freq: Three times a day (TID) | ORAL | 0 refills | Status: DC | PRN

## 2020-01-17 MED ORDER — LEVOTHYROXINE SODIUM 88 MCG PO TABS: ORAL_TABLET | ORAL | 1 refills | Status: AC

## 2020-01-17 NOTE — Telephone Encounter (Signed)
Patient coming in for appointment this afternoon. 

## 2020-01-17 NOTE — Progress Notes (Addendum)
[facility] [place], [place], [place] [address] [phone]  Date:  01/17/2020   Name:  Carolyn Myers   DOB:  November 20, 1936   MRN:  [id number]  PCP:  Darreld Mclean, MD    Chief Complaint: Urinary Tract Infection (requeting antibiotic, weakness) and Nephrology Referral   History of Present Illness:  Carolyn Myers is a 84 y.o. very pleasant female patient who presents with the following:  Last seen by myself with back pain about 3 weeks ago  Here today with her DIL Patient with history of hypertension, renal artery stenosis/impaired renal function, migraine headache, sleep apnea, hypothyroidism, osteoarthritis, osteoporosis, Hyperlipidemia At our last visit she had possible UTI sx and complaint of pain in her back and legs  We gave her keflex and tylenol#3  Her urine culture showed E coli UTI susceptible to cephalexin  Lumbar films were ok at last visit I placed her a referral to nephrology- she was last seen in July of 2018 They are still waiting to hear from nephrology, I explained that they are very busy and it may take some time for her to be seen-however they will reviewed her chart, and will triaged her appointment as necessary  This morning her daughter-in-law went to her house and found her laying in bed, she seemed weak and did not want to get up.  She called EMS to evaluate her.  They did not feel she needed to go the hospital, but wanted to be seen by PCP for possible UTI  Geni Bers is not sure exactly how long she has been weak, thinks perhaps for a few weeks She does not have any particular pain, except in her bilateral hips  No cough, fever, or nausea/vomiting noted  They would like her to get home PT for her general strength and balance training Pt does not drive  Patient's daughter-in-law brought in all of her medications today, we were able to go over them.  She has some  medications that did not belong to her such as ibuprofen 800 in her bag.  I advised her that she should not take this medication due to her kidney function  She also has a bottle of levothyroxine which was filled in April, we think that she has not been taking her thyroid medication at least not consistently.  This may be why she is feeling so fatigued We went over all of her medications in detail today, separated what she should be taking from what she should not take  Patient Active Problem List   Diagnosis Date Noted  . Osteoporosis 09/05/2019  . Frail elderly 08/03/2019  . 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 Hypertensive urgency 08/23/2018  . Atypical chest pain 08/22/2018  . Sleep apnea   . Palpitations   . Migraine   . Hyperlipidemia   . History of gout   . History of blood transfusion   . Frequent headaches   . Diverticulitis   . Chicken pox   . Arthritis   . CKD (chronic kidney disease) stage 3, GFR 30-59 ml/min (HCC) 02/23/2017  . Age-related osteoporosis without current pathological fracture 02/23/2017  . Esophageal reflux 05/08/2016  . Renal artery stenosis ([facility]) 02/22/2016  . Hypothyroidism 02/22/2016  . Chest pain with high risk for cardiac etiology 02/21/2016  . Gout   . SOB (shortness of breath) on exertion 10/06/2015  . Accelerated hypertension 10/06/2015  . Essential hypertension, benign 11/26/2013  .  Hyperlipidemia LDL goal <100 11/26/2013    Past Medical History:  Diagnosis Date  . Arthritis    "minor in my shoulders" (02/21/2016)  . Chicken pox   . Diverticulitis   . Frequent headaches    "maybe twice/week" (03/12/2016)  . Gout    Right Foot  . History of blood transfusion    "said my blood was low"  . History of gout   . Hyperlipidemia   . Hypertension   . Hypothyroidism   . Migraine    "maybe twice/year" (02/21/2016)  . Palpitations    "doctor thought it was related to my thyroid"  . Sleep apnea    "I don't wear my mask" (02/21/2016)    Past  Surgical History:  Procedure Laterality Date  . ABDOMINAL HYSTERECTOMY     "for fibroid tumors"  . TUBAL LIGATION      Social History   Tobacco Use  . Smoking status: Never Smoker  . Smokeless tobacco: Never Used  Substance Use Topics  . Alcohol use: No  . Drug use: No    Family History  Problem Relation Age of Onset  . Heart disease Mother   . Heart attack Mother        died from it  . Hypertension Mother   . Arthritis Mother   . Stroke Father   . Cancer Maternal Grandmother   . Heart attack Maternal Aunt   . Renal Disease Maternal Aunt   . Multiple myeloma Maternal Uncle   . Emphysema Maternal Uncle   . Heart disease Sister   . Thyroid disease Sister   . Healthy Son        x3  . Healthy Daughter        x5    Allergies  Allergen Reactions  . Pineapple Swelling    Reaction to fresh pineapple - tongue swells, but breathing is not affected    Medication list has been reviewed and updated.  Current Outpatient Medications on File Prior to Visit  Medication Sig Dispense Refill  . acetaminophen-codeine (TYLENOL #3) 300-30 MG tablet Take 0.5-1 tablets by mouth every 8 (eight) hours as needed for moderate pain. 20 tablet 0  . amLODipine (NORVASC) 10 MG tablet Take 1 tablet (10 mg total) by mouth daily. 90 tablet 3  . aspirin EC 81 MG EC tablet Take 1 tablet (81 mg total) by mouth daily.    . Carboxymethylcellulose Sodium ([employer] LUBRICANT EYE DROPS PF OP) Place 1-2 drops into both eyes 3 (three) times daily as needed (for dry eyes).    Marland Kitchen diclofenac Sodium (VOLTAREN) 1 % GEL Apply 2 g topically 4 (four) times daily. Use as needed for painful joints.  Max 32 grams per day 100 g 3  . hydrALAZINE (APRESOLINE) 100 MG tablet TAKE 1 TABLET BY MOUTH 3 TIMES DAILY. MONITOR YOUR BLOOD PRESSURE, HOLD FOR BP 100/55 AND PULSE 55 270 tablet 1  . losartan (COZAAR) 50 MG tablet Take 1 tablet (50 mg total) by mouth daily. 90 tablet 3  . meclizine (ANTIVERT) 12.5 MG tablet Take 1 tablet  (12.5 mg total) by mouth 3 (three) times daily as needed for dizziness. 30 tablet 0  . nitroGLYCERIN (NITROSTAT) 0.4 MG SL tablet Place 1 tablet (0.4 mg total) under the tongue every 5 (five) minutes x 3 doses as needed for chest pain. 25 tablet 5  . ranitidine (ZANTAC) 300 MG tablet Take 0.5 tablets (150 mg total) by mouth 2 (two) times daily. 90 tablet 3  .  rosuvastatin (CRESTOR) 10 MG tablet TAKE 1 TABLET BY MOUTH EVERY DAY 90 tablet 1   No current facility-administered medications on file prior to visit.    Review of Systems:  As per HPI- otherwise negative. Patient denies any particular complaints, states that she just feels weak  Physical Examination: Vitals:   01/17/20 1453  BP: 130/60  Pulse: 81  Resp: 15  Temp: (!) 96.7 F (35.9 C)  SpO2: 97%   Vitals:   01/17/20 1453  Weight: 167 lb (75.8 kg)  Height: '5\' 3"'$  (1.6 m)   Body mass index is 29.58 kg/m. Ideal Body Weight: Weight in (lb) to have BMI = 25: 140.8  GEN: WDWN, NAD, Non-toxic, A & O x 3, sitting in wheelchair, cooperative.  Appears her normal self.  Patient seems older than age, which is her baseline HEENT: Atraumatic, Normocephalic. Neck supple. No masses, No LAD. Ears and Nose: No external deformity. CV: RRR, No M/G/R. No JVD. No thrill. No extra heart sounds. PULM: CTA B, no wheezes, crackles, rhonchi. No retractions. No resp. distress. No accessory muscle use. ABD: S, NT, ND. No rebound. No HSM. EXTR: No c/c/e NEURO able to get out of wheelchair and stand with some assistance. Tested strength of all extremities, normal PSYCH: Normally interactive. Conversant. Not depressed or anxious appearing.  Calm demeanor.   Results for orders placed or performed in visit on 01/17/20  Urine Culture   Specimen: Urine  Result Value Ref Range   MICRO NUMBER: [id number]    SPECIMEN QUALITY: Adequate    Sample Source NOT GIVEN    STATUS: FINAL    ISOLATE 1: Escherichia coli (A)       Susceptibility   Escherichia  coli - URINE CULTURE, REFLEX    AMOX/CLAVULANIC <=2 Sensitive     AMPICILLIN 8 Sensitive     AMPICILLIN/SULBACTAM <=2 Sensitive     CEFAZOLIN* <=4 Not Reportable      * For infections other than uncomplicated UTIcaused by E. coli, K. pneumoniae or P. mirabilis:Cefazolin is resistant if MIC > or = 8 mcg/mL.(Distinguishing susceptible versus intermediatefor isolates with MIC < or = 4 mcg/mL requiresadditional testing.)For uncomplicated UTI caused by E. coli,K. pneumoniae or P. mirabilis: Cefazolin issusceptible if MIC <32 mcg/mL and predictssusceptible to the oral agents cefaclor, cefdinir,cefpodoxime, cefprozil, cefuroxime, cephalexinand loracarbef.    CEFEPIME <=1 Sensitive     CEFTRIAXONE <=1 Sensitive     CIPROFLOXACIN >=4 Resistant     LEVOFLOXACIN >=8 Resistant     ERTAPENEM <=0.5 Sensitive     GENTAMICIN <=1 Sensitive     IMIPENEM <=0.25 Sensitive     NITROFURANTOIN <=16 Sensitive     PIP/TAZO <=4 Sensitive     TOBRAMYCIN <=1 Sensitive     TRIMETH/SULFA* <=20 Sensitive      * For infections other than uncomplicated UTIcaused by E. coli, K. pneumoniae or P. mirabilis:Cefazolin is resistant if MIC > or = 8 mcg/mL.(Distinguishing susceptible versus intermediatefor isolates with MIC < or = 4 mcg/mL requiresadditional testing.)For uncomplicated UTI caused by E. coli,K. pneumoniae or P. mirabilis: Cefazolin issusceptible if MIC <32 mcg/mL and predictssusceptible to the oral agents cefaclor, cefdinir,cefpodoxime, cefprozil, cefuroxime, cephalexinand loracarbef.Legend:S = Susceptible  I = IntermediateR = Resistant  NS = Not susceptible* = Not tested  NR = Not reported**NN = See antimicrobic comments  CBC  Result Value Ref Range   WBC 14.8 (H) 4.0 - 10.5 K/uL   RBC 3.30 (L) 3.87 - 5.11 Mil/uL   Platelets 202.0 150.0 - 400.0  K/uL   Hemoglobin 9.2 (L) 12.0 - 15.0 g/dL   HCT 28.7 (L) 36.0 - 46.0 %   MCV 86.8 78.0 - 100.0 fl   MCHC 32.2 30.0 - 36.0 g/dL   RDW 14.8 11.5 - 15.5 %  Comprehensive  metabolic panel  Result Value Ref Range   Sodium 138 135 - 145 mEq/L   Potassium 3.6 3.5 - 5.1 mEq/L   Chloride 98 96 - 112 mEq/L   CO2 26 19 - 32 mEq/L   Glucose, Bld 117 (H) 70 - 99 mg/dL   BUN 24 (H) 6 - 23 mg/dL   Creatinine, Ser 2.53 (H) 0.40 - 1.20 mg/dL   Total Bilirubin 0.6 0.2 - 1.2 mg/dL   Alkaline Phosphatase 80 39 - 117 U/L   AST 24 0 - 37 U/L   ALT 6 0 - 35 U/L   Total Protein 7.4 6.0 - 8.3 g/dL   Albumin 4.3 3.5 - 5.2 g/dL   GFR 22.04 (L) >60.00 mL/min   Calcium 11.9 (H) 8.4 - 10.5 mg/dL  TSH  Result Value Ref Range   TSH 92.40 (H) 0.35 - 4.50 uIU/mL  Sedimentation rate  Result Value Ref Range   Sed Rate 125 (H) 0 - 30 mm/hr  POCT urinalysis dipstick  Result Value Ref Range   Color, UA yellow yellow   Clarity, UA cloudy (A) clear   Glucose, UA negative negative mg/dL   Bilirubin, UA negative negative   Ketones, POC UA trace (5) (A) negative mg/dL   Spec Grav, UA 1.020 1.010 - 1.025   Blood, UA negative negative   pH, UA 6.0 5.0 - 8.0   Protein Ur, POC =100 (A) negative mg/dL   Urobilinogen, UA 0.2 0.2 or 1.0 E.U./dL   Nitrite, UA Positive (A) Negative   Leukocytes, UA Moderate (2+) (A) Negative    Assessment and Plan: Dysuria - Plan: Urine Culture, POCT urinalysis dipstick, amoxicillin-clavulanate (AUGMENTIN) 500-125 MG tablet  Acquired hypothyroidism - Plan: TSH, levothyroxine (SYNTHROID) 88 MCG tablet  Stage 3 chronic kidney disease, unspecified whether stage 3a or 3b CKD - Plan: CBC, Comprehensive metabolic panel, Sedimentation rate  Nausea - Plan: ondansetron (ZOFRAN) 4 MG tablet  Weakness - Plan: DG Chest 2 View, Ambulatory referral to [facility] elderly - Plan: Ambulatory referral to Home Health  Gait instability - Plan: Ambulatory referral to Home Health  Medication management - Plan: Ambulatory referral to Home Health  Here today with concern of weakness and fatigue It appears that she has not been taking her thyroid medication,  which certainly could contribute.  I refilled a new bottle of her medication, and encouraged her to start back on this right away We also suspect she may have a recurrent UTI, will treat with Augmentin and await culture Stage III kidney disease, will check her labs today.  Referral is pending for nephrology Also obtain chest x-ray today due to nonspecific symptoms of weakness and fatigue and elderly  Place referral to home health today to work on medication organization, gait training and general strengthening  This visit occurred during the SARS-CoV-2 public health emergency.  Safety protocols were in place, including screening questions prior to the visit, additional usage of staff PPE, and extensive cleaning of exam room while observing appropriate contact time as indicated for disinfecting solutions.  Medical decision making today is high Signed Lamar Blinks, MD  Received her chest film, message to patient  DG Chest 2 View  Result Date: 01/17/2020 CLINICAL DATA:  84 year old female with weakness and malaise for 1 week. Back pain and urinary symptoms. EXAM: CHEST - 2 VIEW COMPARISON:  08/22/2018 chest radiographs and earlier. FINDINGS: AP and lateral views. Lung volumes and mediastinal contours are stable and within normal limits. Visualized tracheal air column is within normal limits. Both lungs appear clear. No pneumothorax or pleural effusion. No acute osseous abnormality identified. Negative visible bowel gas pattern. IMPRESSION: No acute cardiopulmonary abnormality. Electronically Signed   By: Genevie Ann M.D.   On: 01/17/2020 16:07   DG Lumbar Spine Complete  Result Date: 12/28/2019 CLINICAL DATA:  Low back pain EXAM: LUMBAR SPINE - COMPLETE 4+ VIEW COMPARISON:  CT 02/21/2016 FINDINGS: Minimal scoliosis of the spine. Sagittal alignment is within normal limits. Vertebral body heights are maintained. Mild degenerative changes at L4-L5 and L5-S1. Aortic atherosclerosis. IMPRESSION: Mild  degenerative changes.  No acute osseous abnormality. Electronically Signed   By: Donavan Foil M.D.   On: 12/28/2019 22:12    Addendum 1/28, received her labs as below.  Message to patient and also called her daughter Vonda [place] and Burman [place], [place] at home   Renal function is significantly worse than December High TSH indicates she is likely not taking thyroid medication CBC suggestive of infection, also anemia which has come and gone Elevated segmentation rate  Elssa called back and is able to speak with her.  Explained that her mother's labs are worse than typical, I am concerned that she may have an infection.  We presume likely bladder infection, but we will know for sure until her urine culture comes back She plans to go check on her in about 1 hour, after she finishes work.  I advised her that if her mom is not looking significantly better, she should take her to the emergency room for evaluation and IV fluids.  If she is looking better, that hopefully she will respond well to treatment.  I scheduled her an appoint with me on Monday for recheck in any case  Results for orders placed or performed in visit on 01/17/20  Urine Culture   Specimen: Urine  Result Value Ref Range   MICRO NUMBER: [id number]    SPECIMEN QUALITY: Adequate    Sample Source NOT GIVEN    STATUS: FINAL    ISOLATE 1: Escherichia coli (A)       Susceptibility   Escherichia coli - URINE CULTURE, REFLEX    AMOX/CLAVULANIC <=2 Sensitive     AMPICILLIN 8 Sensitive     AMPICILLIN/SULBACTAM <=2 Sensitive     CEFAZOLIN* <=4 Not Reportable      * For infections other than uncomplicated UTIcaused by E. coli, K. pneumoniae or P. mirabilis:Cefazolin is resistant if MIC > or = 8 mcg/mL.(Distinguishing susceptible versus intermediatefor isolates with MIC < or = 4 mcg/mL requiresadditional testing.)For uncomplicated UTI caused by E. coli,K. pneumoniae or P. mirabilis: Cefazolin issusceptible if MIC <32 mcg/mL and predictssusceptible to the  oral agents cefaclor, cefdinir,cefpodoxime, cefprozil, cefuroxime, cephalexinand loracarbef.    CEFEPIME <=1 Sensitive     CEFTRIAXONE <=1 Sensitive     CIPROFLOXACIN >=4 Resistant     LEVOFLOXACIN >=8 Resistant     ERTAPENEM <=0.5 Sensitive     GENTAMICIN <=1 Sensitive     IMIPENEM <=0.25 Sensitive     NITROFURANTOIN <=16 Sensitive     PIP/TAZO <=4 Sensitive     TOBRAMYCIN <=1 Sensitive     TRIMETH/SULFA* <=20 Sensitive      * For infections other than uncomplicated UTIcaused by E. coli,  K. pneumoniae or P. mirabilis:Cefazolin is resistant if MIC > or = 8 mcg/mL.(Distinguishing susceptible versus intermediatefor isolates with MIC < or = 4 mcg/mL requiresadditional testing.)For uncomplicated UTI caused by E. coli,K. pneumoniae or P. mirabilis: Cefazolin issusceptible if MIC <32 mcg/mL and predictssusceptible to the oral agents cefaclor, cefdinir,cefpodoxime, cefprozil, cefuroxime, cephalexinand loracarbef.Legend:S = Susceptible  I = IntermediateR = Resistant  NS = Not susceptible* = Not tested  NR = Not reported**NN = See antimicrobic comments  CBC  Result Value Ref Range   WBC 14.8 (H) 4.0 - 10.5 K/uL   RBC 3.30 (L) 3.87 - 5.11 Mil/uL   Platelets 202.0 150.0 - 400.0 K/uL   Hemoglobin 9.2 (L) 12.0 - 15.0 g/dL   HCT 28.7 (L) 36.0 - 46.0 %   MCV 86.8 78.0 - 100.0 fl   MCHC 32.2 30.0 - 36.0 g/dL   RDW 14.8 11.5 - 15.5 %  Comprehensive metabolic panel  Result Value Ref Range   Sodium 138 135 - 145 mEq/L   Potassium 3.6 3.5 - 5.1 mEq/L   Chloride 98 96 - 112 mEq/L   CO2 26 19 - 32 mEq/L   Glucose, Bld 117 (H) 70 - 99 mg/dL   BUN 24 (H) 6 - 23 mg/dL   Creatinine, Ser 2.53 (H) 0.40 - 1.20 mg/dL   Total Bilirubin 0.6 0.2 - 1.2 mg/dL   Alkaline Phosphatase 80 39 - 117 U/L   AST 24 0 - 37 U/L   ALT 6 0 - 35 U/L   Total Protein 7.4 6.0 - 8.3 g/dL   Albumin 4.3 3.5 - 5.2 g/dL   GFR 22.04 (L) >60.00 mL/min   Calcium 11.9 (H) 8.4 - 10.5 mg/dL  TSH  Result Value Ref Range   TSH 92.40 (H)  0.35 - 4.50 uIU/mL  Sedimentation rate  Result Value Ref Range   Sed Rate 125 (H) 0 - 30 mm/hr  POCT urinalysis dipstick  Result Value Ref Range   Color, UA yellow yellow   Clarity, UA cloudy (A) clear   Glucose, UA negative negative mg/dL   Bilirubin, UA negative negative   Ketones, POC UA trace (5) (A) negative mg/dL   Spec Grav, UA 1.020 1.010 - 1.025   Blood, UA negative negative   pH, UA 6.0 5.0 - 8.0   Protein Ur, POC =100 (A) negative mg/dL   Urobilinogen, UA 0.2 0.2 or 1.0 E.U./dL   Nitrite, UA Positive (A) Negative   Leukocytes, UA Moderate (2+) (A) Negative    Received her urine culture 1/29, positive for E. coli UTI This is sensitive to Augmentin since treatment should be successful Message to patient

## 2020-01-17 NOTE — Patient Instructions (Signed)
It was good to see you again today, I hope that you feel better soon I suspect that you may have an urinary tract infection, we are going to treat this with Augmentin antibiotic I also sent a prescription for Zofran that you can use as needed for nausea Also, I refilled your thyroid medication-levothyroxine.  I am afraid you may not have been taking this recently-this may be partially why you are feeling so bad  We will get some blood work, a urine culture, and a chest x-ray today  I will make a referral for home physical therapy to work on your general strength We can also try to get a home nurse to assist with your medications  Take care, please let me know if any other concerns

## 2020-01-17 NOTE — Telephone Encounter (Signed)
[facility] called to patient's house this morning in regards to UTI, [facility] called and states that patient is requesting PCP to send in Anibotic for Uti  Vitals:  TEMP 98.4 PULSE 90 O2 98.00 ORA BP 160/80  Not had morning medication this morning per   [facility]   Needs an antobotic  for Uti

## 2020-01-18 ENCOUNTER — Encounter: Payer: Self-pay | Admitting: Family Medicine

## 2020-01-18 LAB — CBC
HCT: 28.7 % — ABNORMAL LOW (ref 36.0–46.0)
Hemoglobin: 9.2 g/dL — ABNORMAL LOW (ref 12.0–15.0)
MCHC: 32.2 g/dL (ref 30.0–36.0)
MCV: 86.8 fl (ref 78.0–100.0)
Platelets: 202 10*3/uL (ref 150.0–400.0)
RBC: 3.3 Mil/uL — ABNORMAL LOW (ref 3.87–5.11)
RDW: 14.8 % (ref 11.5–15.5)
WBC: 14.8 10*3/uL — ABNORMAL HIGH (ref 4.0–10.5)

## 2020-01-18 LAB — COMPREHENSIVE METABOLIC PANEL
ALT: 6 U/L (ref 0–35)
AST: 24 U/L (ref 0–37)
Albumin: 4.3 g/dL (ref 3.5–5.2)
Alkaline Phosphatase: 80 U/L (ref 39–117)
BUN: 24 mg/dL — ABNORMAL HIGH (ref 6–23)
CO2: 26 mEq/L (ref 19–32)
Calcium: 11.9 mg/dL — ABNORMAL HIGH (ref 8.4–10.5)
Chloride: 98 mEq/L (ref 96–112)
Creatinine, Ser: 2.53 mg/dL — ABNORMAL HIGH (ref 0.40–1.20)
GFR: 22.04 mL/min — ABNORMAL LOW (ref >60.00)
Glucose, Bld: 117 mg/dL — ABNORMAL HIGH (ref 70–99)
Potassium: 3.6 mEq/L (ref 3.5–5.1)
Sodium: 138 mEq/L (ref 135–145)
Total Bilirubin: 0.6 mg/dL (ref 0.2–1.2)
Total Protein: 7.4 g/dL (ref 6.0–8.3)

## 2020-01-18 LAB — TSH: TSH: 92.4 u[IU]/mL — ABNORMAL HIGH (ref 0.35–4.50)

## 2020-01-18 LAB — SEDIMENTATION RATE: Sed Rate: 125 mm/hr — ABNORMAL HIGH (ref 0–30)

## 2020-01-19 ENCOUNTER — Telehealth: Payer: Self-pay

## 2020-01-19 ENCOUNTER — Other Ambulatory Visit: Payer: Self-pay

## 2020-01-19 ENCOUNTER — Encounter: Payer: Self-pay | Admitting: Family Medicine

## 2020-01-19 DIAGNOSIS — I129 Hypertensive chronic kidney disease with stage 1 through stage 4 chronic kidney disease, or unspecified chronic kidney disease: Secondary | ICD-10-CM | POA: Diagnosis not present

## 2020-01-19 DIAGNOSIS — G43909 Migraine, unspecified, not intractable, without status migrainosus: Secondary | ICD-10-CM | POA: Diagnosis not present

## 2020-01-19 DIAGNOSIS — N390 Urinary tract infection, site not specified: Secondary | ICD-10-CM | POA: Diagnosis not present

## 2020-01-19 DIAGNOSIS — E785 Hyperlipidemia, unspecified: Secondary | ICD-10-CM | POA: Diagnosis not present

## 2020-01-19 DIAGNOSIS — N183 Chronic kidney disease, stage 3 unspecified: Secondary | ICD-10-CM | POA: Diagnosis not present

## 2020-01-19 DIAGNOSIS — M1030 Gout due to renal impairment, unspecified site: Secondary | ICD-10-CM | POA: Diagnosis not present

## 2020-01-19 DIAGNOSIS — Z7982 Long term (current) use of aspirin: Secondary | ICD-10-CM | POA: Diagnosis not present

## 2020-01-19 DIAGNOSIS — K219 Gastro-esophageal reflux disease without esophagitis: Secondary | ICD-10-CM | POA: Diagnosis not present

## 2020-01-19 DIAGNOSIS — E039 Hypothyroidism, unspecified: Secondary | ICD-10-CM | POA: Diagnosis not present

## 2020-01-19 DIAGNOSIS — M810 Age-related osteoporosis without current pathological fracture: Secondary | ICD-10-CM | POA: Diagnosis not present

## 2020-01-19 DIAGNOSIS — R482 Apraxia: Secondary | ICD-10-CM | POA: Diagnosis not present

## 2020-01-19 DIAGNOSIS — Z9181 History of falling: Secondary | ICD-10-CM | POA: Diagnosis not present

## 2020-01-19 LAB — URINE CULTURE
MICRO NUMBER:: 10086752
SPECIMEN QUALITY:: ADEQUATE

## 2020-01-19 NOTE — Telephone Encounter (Signed)
Kelly from [facility] called in to get verbal orders for home physical therapy once a week for 1 week. And then twice a week for 3 weeks. And then  Once a week for 1 week. Also need a verbal order for a Education officer, museum,  and a home health aid twice a week for 4 weeks.  Please follow up with Claiborne Billings at [phone] as soon as possible thanks.

## 2020-01-19 NOTE — Telephone Encounter (Signed)
Verbal orders given via detailed message.  

## 2020-01-21 NOTE — Progress Notes (Addendum)
[facility] [place], [place], [place] [address] [phone]  Date:  01/22/2020   Name:  Carolyn Myers   DOB:  February 05, 1936   MRN:  [id number]  PCP:  Darreld Mclean, MD    Chief Complaint: Hypertension (6 month follow up)   History of Present Illness:  Carolyn Myers is a 84 y.o. very pleasant female patient who presents with the following:  Short-term follow-up today for this patient seen last week with weakness and UTI  I saw her on January 27, at that time her daughter-in-law brought her in because she seemed less energetic, weaker than normal Her labs at that time revealed acute renal insult, and E. coli UTI In addition it appears that she had not been taking her thyroid replacement as her TSH was quite elevated  We organized and updated her medications, started Augmentin for UTI Referral back to nephrology-this appointment is still pending Referral to home health for medication management and physical therapy PT came out today and had her walking around her home  Patient notes that she is quite tired right now, but she has been seen after lunch which is when she typically takes a nap.  Otherwise she notes that her right hip is painful, she feels this is what limits her walking.  No known injury to the hip, this is not a new pain  I have been keeping in touch with her daughter, her condition improved with treatment of UTI  Needs repeat renal function today and also CBC She is just finishing up her abx for UTI She is back on her thyroid medication No fever noted She is not eating much She does not have much of an appetite  Her weight is relatively stable as below   Wt Readings from Last 3 Encounters:  01/22/20 164 lb (74.4 kg)  01/17/20 167 lb (75.8 kg)  12/28/19 164 lb (74.4 kg)   In December 2019 she weighed 165 pounds  Patient Active Problem List   Diagnosis Date Noted  .  Osteoporosis 09/05/2019  . Frail elderly 08/03/2019  . 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 Hypertensive urgency 08/23/2018  . Atypical chest pain 08/22/2018  . Sleep apnea   . Palpitations   . Migraine   . Hyperlipidemia   . History of gout   . History of blood transfusion   . Frequent headaches   . Diverticulitis   . Chicken pox   . Arthritis   . CKD (chronic kidney disease) stage 3, GFR 30-59 ml/min (HCC) 02/23/2017  . Age-related osteoporosis without current pathological fracture 02/23/2017  . Esophageal reflux 05/08/2016  . Renal artery stenosis ([facility]) 02/22/2016  . Hypothyroidism 02/22/2016  . Chest pain with high risk for cardiac etiology 02/21/2016  . Gout   . SOB (shortness of breath) on exertion 10/06/2015  . Accelerated hypertension 10/06/2015  . Essential hypertension, benign 11/26/2013  . Hyperlipidemia LDL goal <100 11/26/2013    Past Medical History:  Diagnosis Date  . Arthritis    "minor in my shoulders" (02/21/2016)  . Chicken pox   . Diverticulitis   . Frequent headaches    "maybe twice/week" (03/12/2016)  . Gout    Right Foot  . History of blood transfusion    "said my blood was low"  . History of gout   . Hyperlipidemia   . Hypertension   . Hypothyroidism   . Migraine    "  maybe twice/year" (02/21/2016)  . Palpitations    "doctor thought it was related to my thyroid"  . Sleep apnea    "I don't wear my mask" (02/21/2016)    Past Surgical History:  Procedure Laterality Date  . ABDOMINAL HYSTERECTOMY     "for fibroid tumors"  . TUBAL LIGATION      Social History   Tobacco Use  . Smoking status: Never Smoker  . Smokeless tobacco: Never Used  Substance Use Topics  . Alcohol use: No  . Drug use: No    Family History  Problem Relation Age of Onset  . Heart disease Mother   . Heart attack Mother        died from it  . Hypertension Mother   . Arthritis Mother   . Stroke Father   . Cancer Maternal Grandmother   . Heart attack Maternal  Aunt   . Renal Disease Maternal Aunt   . Multiple myeloma Maternal Uncle   . Emphysema Maternal Uncle   . Heart disease Sister   . Thyroid disease Sister   . Healthy Son        x3  . Healthy Daughter        x5    Allergies  Allergen Reactions  . Pineapple Swelling    Reaction to fresh pineapple - tongue swells, but breathing is not affected    Medication list has been reviewed and updated.  Current Outpatient Medications on File Prior to Visit  Medication Sig Dispense Refill  . acetaminophen-codeine (TYLENOL #3) 300-30 MG tablet Take 0.5-1 tablets by mouth every 8 (eight) hours as needed for moderate pain. 20 tablet 0  . amLODipine (NORVASC) 10 MG tablet Take 1 tablet (10 mg total) by mouth daily. 90 tablet 3  . amoxicillin-clavulanate (AUGMENTIN) 500-125 MG tablet Take 1 tablet (500 mg total) by mouth 2 (two) times daily. 10 tablet 0  . aspirin EC 81 MG EC tablet Take 1 tablet (81 mg total) by mouth daily.    . Carboxymethylcellulose Sodium ([employer] LUBRICANT EYE DROPS PF OP) Place 1-2 drops into both eyes 3 (three) times daily as needed (for dry eyes).    Marland Kitchen diclofenac Sodium (VOLTAREN) 1 % GEL Apply 2 g topically 4 (four) times daily. Use as needed for painful joints.  Max 32 grams per day 100 g 3  . hydrALAZINE (APRESOLINE) 100 MG tablet TAKE 1 TABLET BY MOUTH 3 TIMES DAILY. MONITOR YOUR BLOOD PRESSURE, HOLD FOR BP 100/55 AND PULSE 55 270 tablet 1  . levothyroxine (SYNTHROID) 88 MCG tablet TAKE 1 TABLET BY MOUTH DAILY BEFORE BREAKFAST. 90 tablet 1  . losartan (COZAAR) 50 MG tablet Take 1 tablet (50 mg total) by mouth daily. 90 tablet 3  . meclizine (ANTIVERT) 12.5 MG tablet Take 1 tablet (12.5 mg total) by mouth 3 (three) times daily as needed for dizziness. 30 tablet 0  . nitroGLYCERIN (NITROSTAT) 0.4 MG SL tablet Place 1 tablet (0.4 mg total) under the tongue every 5 (five) minutes x 3 doses as needed for chest pain. 25 tablet 5  . ondansetron (ZOFRAN) 4 MG tablet Take 1 tablet (4  mg total) by mouth every 8 (eight) hours as needed for nausea or vomiting. 20 tablet 0  . ranitidine (ZANTAC) 300 MG tablet Take 0.5 tablets (150 mg total) by mouth 2 (two) times daily. 90 tablet 3  . rosuvastatin (CRESTOR) 10 MG tablet TAKE 1 TABLET BY MOUTH EVERY DAY 90 tablet 1   No current facility-administered medications  on file prior to visit.    Review of Systems:  As per HPI- otherwise negative.   Physical Examination: Vitals:   01/22/20 1528  BP: 126/78  Pulse: 76  Resp: 16  Temp: (!) 95.9 F (35.5 C)  SpO2: 95%   Vitals:   01/22/20 1528  Weight: 164 lb (74.4 kg)  Height: '5\' 3"'$  (1.6 m)   Body mass index is 29.05 kg/m. Ideal Body Weight: Weight in (lb) to have BMI = 25: 140.8  GEN: WDWN, NAD, Non-toxic, A & O x 3, appears perhaps older than stated age, which is her baseline HEENT: Atraumatic, Normocephalic. Neck supple. No masses, No LAD. Ears and Nose: No external deformity. CV: RRR, No M/G/R. No JVD. No thrill. No extra heart sounds. PULM: CTA B, no wheezes, crackles, rhonchi. No retractions. No resp. distress. No accessory muscle use. ABD: S, NT, ND, +BS. No rebound. No HSM. EXTR: No c/c/e NEURO sitting in wheelchair today.  She is able to stand and walk a short distance with assistance Manipulated her right hip through ROM, no distinct tenderness with movement PSYCH: Normally interactive. Conversant. Not depressed or anxious appearing.  Calm demeanor.    Assessment and Plan: Stage 3 chronic kidney disease, unspecified whether stage 3a or 3b CKD - Plan: Basic metabolic panel  Acute cystitis without hematuria - Plan: CBC  Right hip pain - Plan: DG HIP UNILAT W OR W/O PELVIS 2-3 VIEWS RIGHT  Here today for follow-up visit.  She was seen last week with acute cystitis and significant worsening of her baseline chronic renal disease.  She was treated with antibiotics, is here today for recheck CBC, BMP pending She also has concern of right hip pain, this  seems to be limiting her walking ability.  Will obtain plain films of hip today They note that her appetite is poor, but weight is stable Recent normal albumin level  Will plan further follow- up pending labs. Asked patient and family member to monitor her condition closely, alert me if she is getting worse Moderate medical decision making today This visit occurred during the SARS-CoV-2 public health emergency.  Safety protocols were in place, including screening questions prior to the visit, additional usage of staff PPE, and extensive cleaning of exam room while observing appropriate contact time as indicated for disinfecting solutions.   Signed Lamar Blinks, MD  Received her hip film, message to patient DG HIP UNILAT W OR W/O PELVIS 2-3 VIEWS RIGHT  Result Date: 01/22/2020 CLINICAL DATA:  Right hip pain, no history of injury, pain for 3 weeks EXAM: DG HIP (WITH OR WITHOUT PELVIS) 2-3V RIGHT COMPARISON:  None. FINDINGS: Frontal view of the pelvis as well as frontal and frogleg lateral views of the right hip are obtained. No acute displaced fracture. Joint spaces are well preserved. There is prominent spondylosis within the lower lumbar spine greatest at L4-5 and L5-S1. Sacroiliac joints appear normal. IMPRESSION: 1. Unremarkable pelvis and right hip. 2. Lower lumbar spondylosis. Electronically Signed   By: Randa Ngo M.D.   On: 01/22/2020 16:37   Received her labs 2/2- called and spoke with her daughter Claria Dice are worse- need to go to hospital right away and likely be admitted for ARF. They prefer [facility]- I called to give report at ED  Results for orders placed or performed in visit on 38/10/17  Basic metabolic panel  Result Value Ref Range   Sodium 137 135 - 145 mEq/L   Potassium 3.9 3.5 - 5.1 mEq/L  Chloride 96 96 - 112 mEq/L   CO2 27 19 - 32 mEq/L   Glucose, Bld 132 (H) 70 - 99 mg/dL   BUN 52 (H) 6 - 23 mg/dL   Creatinine, Ser 5.33 (HH) 0.40 - 1.20 mg/dL   GFR 9.33 (LL)  >60.00 mL/min   Calcium 12.6 (H) 8.4 - 10.5 mg/dL  CBC  Result Value Ref Range   WBC 15.1 (H) 4.0 - 10.5 K/uL   RBC 3.02 (L) 3.87 - 5.11 Mil/uL   Platelets 184.0 150.0 - 400.0 K/uL   Hemoglobin 8.5 Repeated and verified X2. (L) 12.0 - 15.0 g/dL   HCT 26.0 (L) 36.0 - 46.0 %   MCV 86.1 78.0 - 100.0 fl   MCHC 32.5 30.0 - 36.0 g/dL   RDW 14.7 11.5 - 15.5 %

## 2020-01-22 ENCOUNTER — Ambulatory Visit (HOSPITAL_BASED_OUTPATIENT_CLINIC_OR_DEPARTMENT_OTHER)
Admission: RE | Admit: 2020-01-22 | Discharge: 2020-01-22 | Disposition: A | Discharge status: Home / Self Care | Payer: Medicare Other | Source: Ambulatory Visit | Attending: Family Medicine | Admitting: Family Medicine

## 2020-01-22 ENCOUNTER — Ambulatory Visit (INDEPENDENT_AMBULATORY_CARE_PROVIDER_SITE_OTHER): Payer: Medicare Other | Admitting: Family Medicine

## 2020-01-22 ENCOUNTER — Encounter: Payer: Self-pay | Admitting: Family Medicine

## 2020-01-22 ENCOUNTER — Other Ambulatory Visit: Payer: Self-pay

## 2020-01-22 VITALS — BP 126/78 | HR 76 | Temp 95.9°F | Resp 16 | Ht 63.0 in | Wt 164.0 lb

## 2020-01-22 DIAGNOSIS — E785 Hyperlipidemia, unspecified: Secondary | ICD-10-CM | POA: Diagnosis not present

## 2020-01-22 DIAGNOSIS — D649 Anemia, unspecified: Secondary | ICD-10-CM | POA: Diagnosis not present

## 2020-01-22 DIAGNOSIS — M810 Age-related osteoporosis without current pathological fracture: Secondary | ICD-10-CM | POA: Diagnosis not present

## 2020-01-22 DIAGNOSIS — R630 Anorexia: Secondary | ICD-10-CM

## 2020-01-22 DIAGNOSIS — E039 Hypothyroidism, unspecified: Secondary | ICD-10-CM | POA: Diagnosis not present

## 2020-01-22 DIAGNOSIS — M1030 Gout due to renal impairment, unspecified site: Secondary | ICD-10-CM | POA: Diagnosis not present

## 2020-01-22 DIAGNOSIS — M25551 Pain in right hip: Secondary | ICD-10-CM | POA: Diagnosis not present

## 2020-01-22 DIAGNOSIS — R809 Proteinuria, unspecified: Secondary | ICD-10-CM | POA: Diagnosis not present

## 2020-01-22 DIAGNOSIS — Z743 Need for continuous supervision: Secondary | ICD-10-CM | POA: Diagnosis not present

## 2020-01-22 DIAGNOSIS — R488 Other symbolic dysfunctions: Secondary | ICD-10-CM | POA: Diagnosis not present

## 2020-01-22 DIAGNOSIS — N183 Chronic kidney disease, stage 3 unspecified: Secondary | ICD-10-CM | POA: Diagnosis not present

## 2020-01-22 DIAGNOSIS — R2681 Unsteadiness on feet: Secondary | ICD-10-CM | POA: Diagnosis not present

## 2020-01-22 DIAGNOSIS — N1832 Chronic kidney disease, stage 3b: Secondary | ICD-10-CM | POA: Diagnosis not present

## 2020-01-22 DIAGNOSIS — Z7401 Bed confinement status: Secondary | ICD-10-CM | POA: Diagnosis not present

## 2020-01-22 DIAGNOSIS — M549 Dorsalgia, unspecified: Secondary | ICD-10-CM | POA: Diagnosis not present

## 2020-01-22 DIAGNOSIS — Z7982 Long term (current) use of aspirin: Secondary | ICD-10-CM | POA: Diagnosis not present

## 2020-01-22 DIAGNOSIS — D638 Anemia in other chronic diseases classified elsewhere: Secondary | ICD-10-CM | POA: Diagnosis not present

## 2020-01-22 DIAGNOSIS — I129 Hypertensive chronic kidney disease with stage 1 through stage 4 chronic kidney disease, or unspecified chronic kidney disease: Secondary | ICD-10-CM | POA: Diagnosis not present

## 2020-01-22 DIAGNOSIS — G4730 Sleep apnea, unspecified: Secondary | ICD-10-CM | POA: Diagnosis not present

## 2020-01-22 DIAGNOSIS — E876 Hypokalemia: Secondary | ICD-10-CM | POA: Diagnosis not present

## 2020-01-22 DIAGNOSIS — M5489 Other dorsalgia: Secondary | ICD-10-CM | POA: Diagnosis not present

## 2020-01-22 DIAGNOSIS — Z9181 History of falling: Secondary | ICD-10-CM | POA: Diagnosis not present

## 2020-01-22 DIAGNOSIS — Z87440 Personal history of urinary (tract) infections: Secondary | ICD-10-CM | POA: Diagnosis not present

## 2020-01-22 DIAGNOSIS — M545 Low back pain: Secondary | ICD-10-CM | POA: Diagnosis not present

## 2020-01-22 DIAGNOSIS — Z90710 Acquired absence of both cervix and uterus: Secondary | ICD-10-CM | POA: Diagnosis not present

## 2020-01-22 DIAGNOSIS — N281 Cyst of kidney, acquired: Secondary | ICD-10-CM | POA: Diagnosis not present

## 2020-01-22 DIAGNOSIS — D696 Thrombocytopenia, unspecified: Secondary | ICD-10-CM | POA: Diagnosis not present

## 2020-01-22 DIAGNOSIS — I10 Essential (primary) hypertension: Secondary | ICD-10-CM | POA: Diagnosis not present

## 2020-01-22 DIAGNOSIS — M6281 Muscle weakness (generalized): Secondary | ICD-10-CM | POA: Diagnosis not present

## 2020-01-22 DIAGNOSIS — G43909 Migraine, unspecified, not intractable, without status migrainosus: Secondary | ICD-10-CM | POA: Diagnosis not present

## 2020-01-22 DIAGNOSIS — N3000 Acute cystitis without hematuria: Secondary | ICD-10-CM

## 2020-01-22 DIAGNOSIS — R5383 Other fatigue: Secondary | ICD-10-CM | POA: Diagnosis not present

## 2020-01-22 DIAGNOSIS — Z20822 Contact with and (suspected) exposure to covid-19: Secondary | ICD-10-CM | POA: Diagnosis not present

## 2020-01-22 DIAGNOSIS — D892 Hypergammaglobulinemia, unspecified: Secondary | ICD-10-CM | POA: Diagnosis not present

## 2020-01-22 DIAGNOSIS — C9000 Multiple myeloma not having achieved remission: Secondary | ICD-10-CM | POA: Diagnosis not present

## 2020-01-22 DIAGNOSIS — K219 Gastro-esophageal reflux disease without esophagitis: Secondary | ICD-10-CM | POA: Diagnosis not present

## 2020-01-22 DIAGNOSIS — M2550 Pain in unspecified joint: Secondary | ICD-10-CM | POA: Diagnosis not present

## 2020-01-22 DIAGNOSIS — M5431 Sciatica, right side: Secondary | ICD-10-CM | POA: Diagnosis not present

## 2020-01-22 DIAGNOSIS — H04123 Dry eye syndrome of bilateral lacrimal glands: Secondary | ICD-10-CM | POA: Diagnosis not present

## 2020-01-22 DIAGNOSIS — I959 Hypotension, unspecified: Secondary | ICD-10-CM | POA: Diagnosis not present

## 2020-01-22 DIAGNOSIS — R52 Pain, unspecified: Secondary | ICD-10-CM | POA: Diagnosis not present

## 2020-01-22 DIAGNOSIS — M109 Gout, unspecified: Secondary | ICD-10-CM | POA: Diagnosis not present

## 2020-01-22 DIAGNOSIS — D7589 Other specified diseases of blood and blood-forming organs: Secondary | ICD-10-CM | POA: Diagnosis not present

## 2020-01-22 DIAGNOSIS — N189 Chronic kidney disease, unspecified: Secondary | ICD-10-CM | POA: Diagnosis not present

## 2020-01-22 DIAGNOSIS — R498 Other voice and resonance disorders: Secondary | ICD-10-CM | POA: Diagnosis not present

## 2020-01-22 DIAGNOSIS — R482 Apraxia: Secondary | ICD-10-CM | POA: Diagnosis not present

## 2020-01-22 DIAGNOSIS — Z66 Do not resuscitate: Secondary | ICD-10-CM | POA: Diagnosis not present

## 2020-01-22 DIAGNOSIS — N179 Acute kidney failure, unspecified: Secondary | ICD-10-CM | POA: Diagnosis not present

## 2020-01-22 DIAGNOSIS — N390 Urinary tract infection, site not specified: Secondary | ICD-10-CM | POA: Diagnosis not present

## 2020-01-22 DIAGNOSIS — D631 Anemia in chronic kidney disease: Secondary | ICD-10-CM | POA: Diagnosis not present

## 2020-01-22 NOTE — Patient Instructions (Signed)
It was good to see you today- I will be in touch with your labs and x-ray asap Let me know if you are getting worse or have any other concerns

## 2020-01-23 ENCOUNTER — Telehealth: Payer: Self-pay | Admitting: *Deleted

## 2020-01-23 ENCOUNTER — Emergency Department (HOSPITAL_COMMUNITY): Payer: Medicare Other

## 2020-01-23 ENCOUNTER — Other Ambulatory Visit: Payer: Self-pay

## 2020-01-23 ENCOUNTER — Encounter (HOSPITAL_COMMUNITY): Payer: Self-pay | Admitting: Emergency Medicine

## 2020-01-23 ENCOUNTER — Inpatient Hospital Stay (HOSPITAL_COMMUNITY)
Admission: EM | Admit: 2020-01-23 | Discharge: 2020-02-01 | DRG: 841 | Disposition: A | Discharge status: Home / Self Care | Payer: Medicare Other | Attending: Internal Medicine | Admitting: Internal Medicine

## 2020-01-23 DIAGNOSIS — Z791 Long term (current) use of non-steroidal anti-inflammatories (NSAID): Secondary | ICD-10-CM

## 2020-01-23 DIAGNOSIS — G4730 Sleep apnea, unspecified: Secondary | ICD-10-CM | POA: Diagnosis present

## 2020-01-23 DIAGNOSIS — Z8349 Family history of other endocrine, nutritional and metabolic diseases: Secondary | ICD-10-CM

## 2020-01-23 DIAGNOSIS — E876 Hypokalemia: Secondary | ICD-10-CM | POA: Diagnosis not present

## 2020-01-23 DIAGNOSIS — M25551 Pain in right hip: Secondary | ICD-10-CM | POA: Diagnosis present

## 2020-01-23 DIAGNOSIS — M5431 Sciatica, right side: Secondary | ICD-10-CM | POA: Diagnosis present

## 2020-01-23 DIAGNOSIS — Z90710 Acquired absence of both cervix and uterus: Secondary | ICD-10-CM | POA: Diagnosis not present

## 2020-01-23 DIAGNOSIS — R630 Anorexia: Secondary | ICD-10-CM | POA: Diagnosis present

## 2020-01-23 DIAGNOSIS — N1832 Chronic kidney disease, stage 3b: Secondary | ICD-10-CM | POA: Diagnosis present

## 2020-01-23 DIAGNOSIS — E039 Hypothyroidism, unspecified: Secondary | ICD-10-CM | POA: Diagnosis present

## 2020-01-23 DIAGNOSIS — D696 Thrombocytopenia, unspecified: Secondary | ICD-10-CM | POA: Diagnosis present

## 2020-01-23 DIAGNOSIS — M549 Dorsalgia, unspecified: Secondary | ICD-10-CM | POA: Diagnosis not present

## 2020-01-23 DIAGNOSIS — Z20822 Contact with and (suspected) exposure to covid-19: Secondary | ICD-10-CM | POA: Diagnosis present

## 2020-01-23 DIAGNOSIS — C9000 Multiple myeloma not having achieved remission: Secondary | ICD-10-CM

## 2020-01-23 DIAGNOSIS — Z7982 Long term (current) use of aspirin: Secondary | ICD-10-CM

## 2020-01-23 DIAGNOSIS — D638 Anemia in other chronic diseases classified elsewhere: Secondary | ICD-10-CM | POA: Diagnosis present

## 2020-01-23 DIAGNOSIS — I10 Essential (primary) hypertension: Secondary | ICD-10-CM | POA: Diagnosis not present

## 2020-01-23 DIAGNOSIS — D7589 Other specified diseases of blood and blood-forming organs: Secondary | ICD-10-CM | POA: Diagnosis not present

## 2020-01-23 DIAGNOSIS — E785 Hyperlipidemia, unspecified: Secondary | ICD-10-CM | POA: Diagnosis present

## 2020-01-23 DIAGNOSIS — Z87440 Personal history of urinary (tract) infections: Secondary | ICD-10-CM

## 2020-01-23 DIAGNOSIS — M810 Age-related osteoporosis without current pathological fracture: Secondary | ICD-10-CM | POA: Diagnosis present

## 2020-01-23 DIAGNOSIS — I129 Hypertensive chronic kidney disease with stage 1 through stage 4 chronic kidney disease, or unspecified chronic kidney disease: Secondary | ICD-10-CM | POA: Diagnosis present

## 2020-01-23 DIAGNOSIS — N183 Chronic kidney disease, stage 3 unspecified: Secondary | ICD-10-CM | POA: Diagnosis not present

## 2020-01-23 DIAGNOSIS — Z66 Do not resuscitate: Secondary | ICD-10-CM | POA: Diagnosis present

## 2020-01-23 DIAGNOSIS — D892 Hypergammaglobulinemia, unspecified: Secondary | ICD-10-CM | POA: Diagnosis present

## 2020-01-23 DIAGNOSIS — M109 Gout, unspecified: Secondary | ICD-10-CM | POA: Diagnosis present

## 2020-01-23 DIAGNOSIS — G43909 Migraine, unspecified, not intractable, without status migrainosus: Secondary | ICD-10-CM | POA: Diagnosis present

## 2020-01-23 DIAGNOSIS — Z79899 Other long term (current) drug therapy: Secondary | ICD-10-CM

## 2020-01-23 DIAGNOSIS — N179 Acute kidney failure, unspecified: Secondary | ICD-10-CM | POA: Diagnosis not present

## 2020-01-23 DIAGNOSIS — D649 Anemia, unspecified: Secondary | ICD-10-CM | POA: Diagnosis not present

## 2020-01-23 DIAGNOSIS — R52 Pain, unspecified: Secondary | ICD-10-CM

## 2020-01-23 LAB — URINALYSIS, ROUTINE W REFLEX MICROSCOPIC
Bilirubin Urine: NEGATIVE
Glucose, UA: NEGATIVE mg/dL
Hgb urine dipstick: NEGATIVE
Ketones, ur: NEGATIVE mg/dL
Nitrite: NEGATIVE
Protein, ur: 30 mg/dL — AB
Specific Gravity, Urine: 1.012 (ref 1.005–1.030)
pH: 8 (ref 5.0–8.0)

## 2020-01-23 LAB — BASIC METABOLIC PANEL
Anion gap: 16 — ABNORMAL HIGH (ref 5–15)
BUN: 52 mg/dL — ABNORMAL HIGH (ref 6–23)
BUN: 53 mg/dL — ABNORMAL HIGH (ref 8–23)
CO2: 27 mEq/L (ref 19–32)
CO2: 27 mmol/L (ref 22–32)
Calcium: 12.6 mg/dL — ABNORMAL HIGH (ref 8.4–10.5)
Calcium: 12.1 mg/dL — ABNORMAL HIGH (ref 8.9–10.3)
Chloride: 96 mEq/L (ref 96–112)
Chloride: 95 mmol/L — ABNORMAL LOW (ref 98–111)
Creatinine, Ser: 5.33 mg/dL (ref 0.40–1.20)
Creatinine, Ser: 5.18 mg/dL — ABNORMAL HIGH (ref 0.44–1.00)
GFR calc Af Amer: 8 mL/min — ABNORMAL LOW (ref >60)
GFR calc non Af Amer: 7 mL/min — ABNORMAL LOW (ref >60)
GFR: 9.33 mL/min — CL (ref >60.00)
Glucose, Bld: 132 mg/dL — ABNORMAL HIGH (ref 70–99)
Glucose, Bld: 114 mg/dL — ABNORMAL HIGH (ref 70–99)
Potassium: 3.9 mEq/L (ref 3.5–5.1)
Potassium: 4 mmol/L (ref 3.5–5.1)
Sodium: 137 mEq/L (ref 135–145)
Sodium: 138 mmol/L (ref 135–145)

## 2020-01-23 LAB — CBC
HCT: 26 % — ABNORMAL LOW (ref 36.0–46.0)
HCT: 25.3 % — ABNORMAL LOW (ref 36.0–46.0)
Hemoglobin: 8.5 g/dL — ABNORMAL LOW (ref 12.0–15.0)
Hemoglobin: 8.1 g/dL — ABNORMAL LOW (ref 12.0–15.0)
MCH: 28.3 pg (ref 26.0–34.0)
MCHC: 32.5 g/dL (ref 30.0–36.0)
MCHC: 32 g/dL (ref 30.0–36.0)
MCV: 86.1 fl (ref 78.0–100.0)
MCV: 88.5 fL (ref 80.0–100.0)
Platelets: 184 10*3/uL (ref 150.0–400.0)
Platelets: 164 10*3/uL (ref 150–400)
RBC: 3.02 Mil/uL — ABNORMAL LOW (ref 3.87–5.11)
RBC: 2.86 MIL/uL — ABNORMAL LOW (ref 3.87–5.11)
RDW: 14.7 % (ref 11.5–15.5)
RDW: 14.6 % (ref 11.5–15.5)
WBC: 15.1 10*3/uL — ABNORMAL HIGH (ref 4.0–10.5)
WBC: 14.6 10*3/uL — ABNORMAL HIGH (ref 4.0–10.5)
nRBC: 1.1 % — ABNORMAL HIGH (ref 0.0–0.2)

## 2020-01-23 LAB — FERRITIN: Ferritin: 914 ng/mL — ABNORMAL HIGH (ref 11–307)

## 2020-01-23 LAB — IRON AND TIBC
Iron: 93 ug/dL (ref 28–170)
Saturation Ratios: 42 % — ABNORMAL HIGH (ref 10.4–31.8)
TIBC: 223 ug/dL — ABNORMAL LOW (ref 250–450)
UIBC: 130 ug/dL

## 2020-01-23 LAB — SARS CORONAVIRUS 2 (TAT 6-24 HRS): SARS Coronavirus 2: NEGATIVE

## 2020-01-23 LAB — SODIUM, URINE, RANDOM: Sodium, Ur: 81 mmol/L

## 2020-01-23 LAB — CREATININE, URINE, RANDOM: Creatinine, Urine: 64.13 mg/dL

## 2020-01-23 LAB — PROTEIN / CREATININE RATIO, URINE
Creatinine, Urine: 63.88 mg/dL
Protein Creatinine Ratio: 6.04 mg/mg{Cre} — ABNORMAL HIGH (ref 0.00–0.15)
Total Protein, Urine: 386 mg/dL

## 2020-01-23 LAB — VITAMIN D 25 HYDROXY (VIT D DEFICIENCY, FRACTURES): Vit D, 25-Hydroxy: 20.67 ng/mL — ABNORMAL LOW (ref 30–100)

## 2020-01-23 LAB — URIC ACID: Uric Acid, Serum: 17.1 mg/dL — ABNORMAL HIGH (ref 2.5–7.1)

## 2020-01-23 LAB — CK: Total CK: 91 U/L (ref 38–234)

## 2020-01-23 MED ORDER — HYDRALAZINE HCL 50 MG PO TABS
100.0000 mg | ORAL_TABLET | Freq: Three times a day (TID) | ORAL | Status: DC
  Administered 2020-01-23 – 2020-02-01 (×27): 100 mg via ORAL
  Filled 2020-01-23 (×29): qty 2

## 2020-01-23 MED ORDER — SODIUM CHLORIDE 0.9 % IV SOLN
1.0000 g | INTRAVENOUS | Status: AC
  Administered 2020-01-23 – 2020-01-27 (×5): 1 g via INTRAVENOUS
  Filled 2020-01-23 (×5): qty 10

## 2020-01-23 MED ORDER — SODIUM CHLORIDE 0.9 % IV SOLN: INTRAVENOUS | Status: DC

## 2020-01-23 MED ORDER — ASPIRIN EC 81 MG PO TBEC
81.0000 mg | DELAYED_RELEASE_TABLET | Freq: Every day | ORAL | Status: DC
  Administered 2020-01-23 – 2020-01-24 (×2): 81 mg via ORAL
  Filled 2020-01-23 (×2): qty 1

## 2020-01-23 MED ORDER — ROSUVASTATIN CALCIUM 5 MG PO TABS
10.0000 mg | ORAL_TABLET | Freq: Every day | ORAL | Status: DC
  Administered 2020-01-23 – 2020-01-31 (×9): 10 mg via ORAL
  Filled 2020-01-23 (×11): qty 2

## 2020-01-23 MED ORDER — LEVOTHYROXINE SODIUM 88 MCG PO TABS
88.0000 ug | ORAL_TABLET | Freq: Every day | ORAL | Status: DC
  Administered 2020-01-24 – 2020-02-01 (×9): 88 ug via ORAL
  Filled 2020-01-23 (×9): qty 1

## 2020-01-23 MED ORDER — DICLOFENAC SODIUM 1 % EX GEL
2.0000 g | Freq: Four times a day (QID) | CUTANEOUS | Status: DC | PRN
  Administered 2020-01-23 – 2020-01-29 (×2): 2 g via TOPICAL
  Filled 2020-01-23: qty 100

## 2020-01-23 MED ORDER — HYDROMORPHONE HCL 2 MG PO TABS
2.0000 mg | ORAL_TABLET | ORAL | Status: DC | PRN
  Administered 2020-01-29: 2 mg via ORAL
  Filled 2020-01-23: qty 1

## 2020-01-23 MED ORDER — ACETAMINOPHEN-CODEINE #3 300-30 MG PO TABS
0.5000 | ORAL_TABLET | Freq: Three times a day (TID) | ORAL | Status: DC | PRN
  Administered 2020-01-23 – 2020-01-24 (×3): 1 via ORAL
  Administered 2020-01-26 – 2020-01-28 (×2): 0.5 via ORAL
  Administered 2020-01-29: 1 via ORAL
  Filled 2020-01-23 (×7): qty 1

## 2020-01-23 MED ORDER — SODIUM CHLORIDE 0.9 % IV SOLN: INTRAVENOUS | Status: AC

## 2020-01-23 MED ORDER — HEPARIN SODIUM (PORCINE) 5000 UNIT/ML IJ SOLN
5000.0000 [IU] | Freq: Two times a day (BID) | INTRAMUSCULAR | Status: DC
  Administered 2020-01-23 – 2020-01-25 (×4): 5000 [IU] via SUBCUTANEOUS
  Filled 2020-01-23 (×4): qty 1

## 2020-01-23 MED ORDER — LACTATED RINGERS IV BOLUS
500.0000 mL | Freq: Once | INTRAVENOUS | Status: AC
  Administered 2020-01-23: 500 mL via INTRAVENOUS

## 2020-01-23 NOTE — ED Triage Notes (Signed)
Pt arrives via EMS for possible kidney failure, sent by PCP. Pt endorses back and hip pain for 2 weeks. Cr yesterday 5.33.

## 2020-01-23 NOTE — Telephone Encounter (Signed)
Dr Carollee Herter -- can you view critical result in PCP's absence?

## 2020-01-23 NOTE — Telephone Encounter (Signed)
I am handling this, thank you both

## 2020-01-23 NOTE — ED Notes (Signed)
Called report to [facility]

## 2020-01-23 NOTE — H&P (Signed)
History and Physical    Carolyn Myers YFV:[id number] DOB: 1936-10-02 DOA: 01/23/2020  PCP: Darreld Mclean, MD   Patient coming from: Home  I have personally briefly reviewed patient's old medical records in [place]  Chief Complaint: My hip hurts  HPI: Carolyn Myers is a 84 y.o. female with medical history significant of CKD stage III, bilateral renal artery stenosis, chronic right hip pain, hypertension, gout, thyroidism, presented with worsening of right hip pain, and AKI.  Patient was recently treated for E. coli UTI, by her PCP on 01/17/2020 with Augmentin.  Before that she was evaluated by orthopedic surgery for worsening of her right hip pain and was placed on Tylenol 3, and image study showed no significant right hip pathology but some lumbar vertebral arthritis.  He went to see her PCP yesterday and blood work showed significant elevation of creatinine level from her baseline 1.92 to 5.3.  She denied taking any NSAIDs on a regular basis. Regarding the right hip pain, she said the nature has been aching and sometimes shooting down to the back side of her right leg, she has been using a walker for ambulation.  ED Course: Cre 5.18, calcium 12.1, renal ultrasound negative for any obstructive uropathy.  Review of Systems: As per HPI otherwise 10 point review of systems negative.    Past Medical History:  Diagnosis Date  . Arthritis    "minor in my shoulders" (02/21/2016)  . Chicken pox   . Diverticulitis   . Frequent headaches    "maybe twice/week" (03/12/2016)  . Gout    Right Foot  . History of blood transfusion    "said my blood was low"  . History of gout   . Hyperlipidemia   . Hypertension   . Hypothyroidism   . Migraine    "maybe twice/year" (02/21/2016)  . Palpitations    "doctor thought it was related to my thyroid"  . Sleep apnea    "I don't wear my mask" (02/21/2016)    Past Surgical History:  Procedure Laterality Date  . ABDOMINAL  HYSTERECTOMY     "for fibroid tumors"  . TUBAL LIGATION       reports that she has never smoked. She has never used smokeless tobacco. She reports that she does not drink alcohol or use drugs.  Allergies  Allergen Reactions  . Pineapple Swelling and Other (See Comments)    Reaction to fresh pineapple - tongue swells, but breathing is not affected    Family History  Problem Relation Age of Onset  . Heart disease Mother   . Heart attack Mother        died from it  . Hypertension Mother   . Arthritis Mother   . Stroke Father   . Cancer Maternal Grandmother   . Heart attack Maternal Aunt   . Renal Disease Maternal Aunt   . Multiple myeloma Maternal Uncle   . Emphysema Maternal Uncle   . Heart disease Sister   . Thyroid disease Sister   . Healthy Son        x3  . Healthy Daughter        x5     Prior to Admission medications   Medication Sig Start Date End Date Taking? Authorizing Provider  acetaminophen-codeine (TYLENOL #3) 300-30 MG tablet Take 0.5-1 tablets by mouth every 8 (eight) hours as needed for moderate pain. 12/28/19  Yes Copland, Gay Filler, MD  aspirin EC 81 MG EC tablet Take 1 tablet (  81 mg total) by mouth daily. 02/22/16  Yes Brett Canales, PA-C  Carboxymethylcellulose Sodium ([employer] LUBRICANT EYE DROPS PF OP) Place 1-2 drops into both eyes 3 (three) times daily as needed (for dry eyes).   Yes [provider]  diclofenac Sodium (VOLTAREN) 1 % GEL Apply 2 g topically 4 (four) times daily. Use as needed for painful joints.  Max 32 grams per day Patient taking differently: Apply 2 g topically 4 (four) times daily as needed (to affected sites/painful joints (right leg/hip) ("MAX 32 GRAMS/DAY")).  11/22/19  Yes Copland, Gay Filler, MD  hydrALAZINE (APRESOLINE) 100 MG tablet TAKE 1 TABLET BY MOUTH 3 TIMES DAILY. MONITOR YOUR BLOOD PRESSURE, HOLD FOR BP 100/55 AND PULSE 55 Patient taking differently: Take 100 mg by mouth See admin instructions. Take 100 mg by mouth three  times a day and MONITOR YOUR BLOOD PRESSURE- HOLD FOR B/P 100/55 AND PULSE 55 11/06/19  Yes Copland, Gay Filler, MD  levothyroxine (SYNTHROID) 88 MCG tablet TAKE 1 TABLET BY MOUTH DAILY BEFORE BREAKFAST. Patient taking differently: Take 88 mcg by mouth daily before breakfast.  01/17/20  Yes Copland, Gay Filler, MD  rosuvastatin (CRESTOR) 10 MG tablet TAKE 1 TABLET BY MOUTH EVERY DAY Patient taking differently: Take 10 mg by mouth at bedtime.  11/06/19  Yes Copland, Gay Filler, MD  amLODipine (NORVASC) 10 MG tablet Take 1 tablet (10 mg total) by mouth daily. Patient not taking: Reported on 01/23/2020 05/04/18   Copland, Gay Filler, MD  amoxicillin-clavulanate (AUGMENTIN) 500-125 MG tablet Take 1 tablet (500 mg total) by mouth 2 (two) times daily. Patient not taking: Reported on 01/23/2020 01/17/20   Copland, Gay Filler, MD  losartan (COZAAR) 50 MG tablet Take 1 tablet (50 mg total) by mouth daily. Patient not taking: Reported on 01/23/2020 05/04/18   Copland, Gay Filler, MD  meclizine (ANTIVERT) 12.5 MG tablet Take 1 tablet (12.5 mg total) by mouth 3 (three) times daily as needed for dizziness. Patient not taking: Reported on 01/23/2020 08/03/19   Copland, Gay Filler, MD  nitroGLYCERIN (NITROSTAT) 0.4 MG SL tablet Place 1 tablet (0.4 mg total) under the tongue every 5 (five) minutes x 3 doses as needed for chest pain. 11/05/16   Belva Crome, MD  ondansetron (ZOFRAN) 4 MG tablet Take 1 tablet (4 mg total) by mouth every 8 (eight) hours as needed for nausea or vomiting. Patient not taking: Reported on 01/23/2020 01/17/20   Copland, Gay Filler, MD  ranitidine (ZANTAC) 300 MG tablet Take 0.5 tablets (150 mg total) by mouth 2 (two) times daily. Patient not taking: Reported on 01/23/2020 05/04/18   Darreld Mclean, MD    Physical Exam: Vitals:   01/23/20 1321 01/23/20 1530 01/23/20 1545  BP: (!) 128/52 (!) 141/60 (!) 151/61  Pulse: 71 67 64  Resp: 16 (!) 21 11  Temp: 98.3 F (36.8 C)    TempSrc: Oral    SpO2: 100%  100% 100%    Constitutional: NAD, calm, comfortable Vitals:   01/23/20 1321 01/23/20 1530 01/23/20 1545  BP: (!) 128/52 (!) 141/60 (!) 151/61  Pulse: 71 67 64  Resp: 16 (!) 21 11  Temp: 98.3 F (36.8 C)    TempSrc: Oral    SpO2: 100% 100% 100%   Eyes: PERRL, lids and conjunctivae normal ENMT: Mucous membranes are moist. Posterior pharynx clear of any exudate or lesions.Normal dentition.  Neck: normal, supple, no masses, no thyromegaly Respiratory: clear to auscultation bilaterally, no wheezing, no crackles. Normal respiratory  effort. No accessory muscle use.  Cardiovascular: Regular rate and rhythm, no murmurs / rubs / gallops. No extremity edema. 2+ pedal pulses. No carotid bruits.  Abdomen: no tenderness, no masses palpated. No hepatosplenomegaly. Bowel sounds positive.  Musculoskeletal: Significant decrease of ROM of right hip, positive straight leg test. Skin: no rashes, lesions, ulcers. No induration Neurologic: CN 2-12 grossly intact. Sensation intact, DTR normal. Strength 5/5 in all 4.  Psychiatric: Normal judgment and insight. Alert and oriented x 3. Normal mood.     Labs on Admission: I have personally reviewed following labs and imaging studies  CBC: Recent Labs  Lab 01/17/20 1533 01/22/20 1613 01/23/20 1323  WBC 14.8* 15.1* 14.6*  HGB 9.2* 8.5 Repeated and verified X2.* 8.1*  HCT 28.7* 26.0* 25.3*  MCV 86.8 86.1 88.5  PLT 202.0 184.0 027   Basic Metabolic Panel: Recent Labs  Lab 01/17/20 1533 01/22/20 1613 01/23/20 1323  NA 138 137 138  K 3.6 3.9 4.0  CL 98 96 95*  CO2 _0 GLUCOSE 117* 132* 114*  BUN 24* 52* 53*  CREATININE 2.53* 5.33* 5.18*  CALCIUM 11.9* 12.6* 12.1*   GFR: Estimated Creatinine Clearance: 8.2 mL/min (A) (by C-G formula based on SCr of 5.18 mg/dL (H)). Liver Function Tests: Recent Labs  Lab 01/17/20 1533  AST 24  ALT 6  ALKPHOS 80  BILITOT 0.6  PROT 7.4  ALBUMIN 4.3   No results for input(s): LIPASE, AMYLASE in the  last 168 hours. No results for input(s): AMMONIA in the last 168 hours. Coagulation Profile: No results for input(s): INR, PROTIME in the last 168 hours. Cardiac Enzymes: No results for input(s): CKTOTAL, CKMB, CKMBINDEX, TROPONINI in the last 168 hours. BNP (last 3 results) No results for input(s): PROBNP in the last 8760 hours. HbA1C: No results for input(s): HGBA1C in the last 72 hours. CBG: No results for input(s): GLUCAP in the last 168 hours. Lipid Profile: No results for input(s): CHOL, HDL, LDLCALC, TRIG, CHOLHDL, LDLDIRECT in the last 72 hours. Thyroid Function Tests: No results for input(s): TSH, T4TOTAL, FREET4, T3FREE, THYROIDAB in the last 72 hours. Anemia Panel: No results for input(s): VITAMINB12, FOLATE, FERRITIN, TIBC, IRON, RETICCTPCT in the last 72 hours. Urine analysis:    Component Value Date/Time   COLORURINE YELLOW 02/21/2016 1240   APPEARANCEUR CLEAR 02/21/2016 1240   LABSPEC 1.017 02/21/2016 1240   PHURINE 6.0 02/21/2016 1240   GLUCOSEU NEGATIVE 02/21/2016 1240   HGBUR NEGATIVE 02/21/2016 1240   BILIRUBINUR negative 01/17/2020 1535   KETONESUR trace (5) (A) 01/17/2020 1535   KETONESUR NEGATIVE 02/21/2016 1240   PROTEINUR =100 (A) 01/17/2020 1535   PROTEINUR NEGATIVE 02/21/2016 1240   UROBILINOGEN 0.2 01/17/2020 1535   NITRITE Positive (A) 01/17/2020 1535   NITRITE NEGATIVE 02/21/2016 1240   LEUKOCYTESUR Moderate (2+) (A) 01/17/2020 1535    Radiological Exams on Admission: US Renal  Result Date: 01/23/2020 CLINICAL DATA:  Right-sided flank pain for 2 weeks EXAM: RENAL / URINARY TRACT ULTRASOUND COMPLETE COMPARISON:  02/21/2016 FINDINGS: Right Kidney: Renal measurements: 10.6 x 5.0 by 5.1 cm = volume: 141 mL. Diffuse increased renal cortical echotexture and decreased corticomedullary differentiation, compatible with medical renal disease. Multifocal areas of cortical thinning and scarring. Multiple small simple renal cysts measuring up to 1.5 cm. Left  Kidney: Renal measurements: 9.5 x 5.9 x 5.0 cm = volume: 148 mL. Diffuse increased renal cortical echotexture and decreased corticomedullary differentiation, compatible with medical renal disease. Bladder: Appears normal for degree of bladder  distention. Other: No evidence of urinary tract calculi or obstructive uropathy. IMPRESSION: 1. Increased renal cortical echotexture consistent with medical renal disease. 2. Chronic right renal cortical thinning and scarring, not appreciably changed since prior CT exam. 3. Small simple cysts right kidney. Electronically Signed   By: Randa Ngo M.D.   On: 01/23/2020 15:49   DG HIP UNILAT W OR W/O PELVIS 2-3 VIEWS RIGHT  Result Date: 01/22/2020 CLINICAL DATA:  Right hip pain, no history of injury, pain for 3 weeks EXAM: DG HIP (WITH OR WITHOUT PELVIS) 2-3V RIGHT COMPARISON:  None. FINDINGS: Frontal view of the pelvis as well as frontal and frogleg lateral views of the right hip are obtained. No acute displaced fracture. Joint spaces are well preserved. There is prominent spondylosis within the lower lumbar spine greatest at L4-5 and L5-S1. Sacroiliac joints appear normal. IMPRESSION: 1. Unremarkable pelvis and right hip. 2. Lower lumbar spondylosis. Electronically Signed   By: Randa Ngo M.D.   On: 01/22/2020 16:37    EKG: Independently reviewed.   Assessment/Plan Active Problems:   AKI (acute kidney injury) ([place])  AKI on CKD stage III Discussed with on-call nephrologist Dr. Posey Pronto, recommend high rate hydration for 6 hours then switch to 100 mL/h continuously. Renal ultrasound unremarkable.  Recurrent UTI, Patient completed Augmentin yesterday. Persistent leukocytosis and AKI, will treat another round of antibiotics with ceftriaxone  Hypercalcemia Send PTH and PTH related peptide, Nephrology send workup to rule out multiple myeloma  Right hip pain Question of referred pain secondary to sciatica? Trial of gabapentin once her kidney function  stabilized.  Hx of Gout Send Uric Acid level.  HTN, fairly controlled Hold ARB Continue Hydralazine.    DVT prophylaxis: Heparin subcu Code Status: DNR Family Communication: None at bedside Disposition Plan: Home once nephrology work-up done and kidney function improved Consults called: Dr. Posey Pronto from nephrology Admission status: Telemetry   Lequita Halt MD [facility] Pager [phone]    01/23/2020, 5:24 PM

## 2020-01-23 NOTE — Telephone Encounter (Signed)
CRITICAL VALUE STICKER  CRITICAL VALUE:  Creatinine -- 5.33.  GFR -- 9.32  RECEIVER (on-site recipient of call):  Kelle Darting, [place] NOTIFIED: 01/23/20 @ 11:01  MESSENGER (representative from lab):  Hope  MD NOTIFIED:  Copland / Bailey  TIME OF NOTIFICATION:  11:02am  RESPONSE:

## 2020-01-23 NOTE — Consult Note (Signed)
Reason for Consult: Acute kidney injury on chronic kidney disease stage IIIb Referring Physician: Wynetta Fines, MD [facility])   HPI:  84 year old African-American woman with past medical history significant for hypertension for the past 20 years or so, known atherosclerotic bilateral renal artery stenosis with chronic kidney disease stage III at baseline (creatinine 1.7-1.9) who was previously under the care of Dr. Florene Glen until 2018.  She also has history of hypothyroidism, osteoporosis, dyslipidemia and a history of gout.  She appears to have suffered 3 recurrent urinary tract infections with E. coli on 11/22/2019, 12/28/2019 and 01/17/2020 with incomplete treatment of her latest infection with Augmentin (after the patient did not perceive improvement).  Labs yesterday were significant for an acute rise of her creatinine to 5.3 from 2.5 on 1/27 along with hypercalcemia of 12.6 and anemia with a hemoglobin of 8.5.  She was on losartan and rosuvastatin prior to admission.  She reports poor appetite but decent fluid intake.  She complains of left-sided flank pain radiating down to her hip and to the front of her abdomen but denies any use of nonsteroidal anti-inflammatory drugs.  She denies any trauma to the side or obvious injury that provoked the pain.  She denies any dysuria, urgency, frequency, hematuria, fever or chills but reports generalized fatigue.  She denies any chest pain or shortness of breath and has not had any pedal edema.  She appears to have poor recall from my encounter with her regarding her health and the question regarding nonadherence to medications is raised.  Renal ultrasound done earlier shows increased echotexture consistent with chronic medical disease, chronic right renal cortical thinning/scarring consistent with previous CT scan and no evidence of obstruction/nephrolithiasis.  Past Medical History:  Diagnosis Date  . Arthritis    "minor in my shoulders" (02/21/2016)  . Chicken pox   .  Diverticulitis   . Frequent headaches    "maybe twice/week" (03/12/2016)  . Gout    Right Foot  . History of blood transfusion    "said my blood was low"  . History of gout   . Hyperlipidemia   . Hypertension   . Hypothyroidism   . Migraine    "maybe twice/year" (02/21/2016)  . Palpitations    "doctor thought it was related to my thyroid"  . Sleep apnea    "I don't wear my mask" (02/21/2016)    Past Surgical History:  Procedure Laterality Date  . ABDOMINAL HYSTERECTOMY     "for fibroid tumors"  . TUBAL LIGATION      Family History  Problem Relation Age of Onset  . Heart disease Mother   . Heart attack Mother        died from it  . Hypertension Mother   . Arthritis Mother   . Stroke Father   . Cancer Maternal Grandmother   . Heart attack Maternal Aunt   . Renal Disease Maternal Aunt   . Multiple myeloma Maternal Uncle   . Emphysema Maternal Uncle   . Heart disease Sister   . Thyroid disease Sister   . Healthy Son        x3  . Healthy Daughter        x5    Social History:  reports that she has never smoked. She has never used smokeless tobacco. She reports that she does not drink alcohol or use drugs.  Allergies:  Allergies  Allergen Reactions  . Pineapple Swelling and Other (See Comments)    Reaction to fresh pineapple - tongue  swells, but breathing is not affected    Medications:  Scheduled: . aspirin EC  81 mg Oral Daily  . heparin  5,000 Units Subcutaneous Q12H  . hydrALAZINE  100 mg Oral TID  . [START ON 01/24/2020] levothyroxine  88 mcg Oral Q0600  . rosuvastatin  10 mg Oral QHS    BMP Latest Ref Rng & Units 01/23/2020 01/22/2020 01/17/2020  Glucose 70 - 99 mg/dL 114(H) 132(H) 117(H)  BUN 8 - 23 mg/dL 53(H) 52(H) 24(H)  Creatinine 0.44 - 1.00 mg/dL 5.18(H) 5.33(HH) 2.53(H)  Sodium 135 - 145 mmol/L 138 137 138  Potassium 3.5 - 5.1 mmol/L 4.0 3.9 3.6  Chloride 98 - 111 mmol/L 95(L) 96 98  CO2 22 - 32 mmol/L 27 27 26  Calcium 8.9 - 10.3 mg/dL 12.1(H)  12.6(H) 11.9(H)   CBC Latest Ref Rng & Units 01/23/2020 01/22/2020 01/17/2020  WBC 4.0 - 10.5 K/uL 14.6(H) 15.1(H) 14.8(H)  Hemoglobin 12.0 - 15.0 g/dL 8.1(L) 8.5 Repeated and verified X2.(L) 9.2(L)  Hematocrit 36.0 - 46.0 % 25.3(L) 26.0(L) 28.7(L)  Platelets 150 - 400 K/uL 164 184.0 202.0   Urinalysis From 01/17/2020: Cloudy urine with positive nitrite, moderate leukocyte esterase, 100 protein, specific gravity 1020 and negative RBCs.   US Renal  Result Date: 01/23/2020 CLINICAL DATA:  Right-sided flank pain for 2 weeks EXAM: RENAL / URINARY TRACT ULTRASOUND COMPLETE COMPARISON:  02/21/2016 FINDINGS: Right Kidney: Renal measurements: 10.6 x 5.0 by 5.1 cm = volume: 141 mL. Diffuse increased renal cortical echotexture and decreased corticomedullary differentiation, compatible with medical renal disease. Multifocal areas of cortical thinning and scarring. Multiple small simple renal cysts measuring up to 1.5 cm. Left Kidney: Renal measurements: 9.5 x 5.9 x 5.0 cm = volume: 148 mL. Diffuse increased renal cortical echotexture and decreased corticomedullary differentiation, compatible with medical renal disease. Bladder: Appears normal for degree of bladder distention. Other: No evidence of urinary tract calculi or obstructive uropathy. IMPRESSION: 1. Increased renal cortical echotexture consistent with medical renal disease. 2. Chronic right renal cortical thinning and scarring, not appreciably changed since prior CT exam. 3. Small simple cysts right kidney. Electronically Signed   By: Michael  Brown M.D.   On: 01/23/2020 15:49   DG HIP UNILAT W OR W/O PELVIS 2-3 VIEWS RIGHT  Result Date: 01/22/2020 CLINICAL DATA:  Right hip pain, no history of injury, pain for 3 weeks EXAM: DG HIP (WITH OR WITHOUT PELVIS) 2-3V RIGHT COMPARISON:  None. FINDINGS: Frontal view of the pelvis as well as frontal and frogleg lateral views of the right hip are obtained. No acute displaced fracture. Joint spaces are well preserved.  There is prominent spondylosis within the lower lumbar spine greatest at L4-5 and L5-S1. Sacroiliac joints appear normal. IMPRESSION: 1. Unremarkable pelvis and right hip. 2. Lower lumbar spondylosis. Electronically Signed   By: Michael  Brown M.D.   On: 01/22/2020 16:37    Review of Systems  Constitutional: Positive for appetite change and fatigue. Negative for chills and fever.  HENT: Negative for hearing loss, nosebleeds, sinus pain and sore throat.   Eyes: Negative for pain, redness and visual disturbance.  Respiratory: Negative for cough, chest tightness, shortness of breath and wheezing.   Cardiovascular: Negative for chest pain, palpitations and leg swelling.  Gastrointestinal: Positive for abdominal pain. Negative for blood in stool, constipation, diarrhea, nausea and vomiting.       Left flank pain extending to her hip and radiating anteriorly  Endocrine: Positive for cold intolerance. Negative for heat intolerance, polydipsia and   polyphagia.  Genitourinary: Positive for flank pain. Negative for hematuria, pelvic pain and urgency.  Musculoskeletal: Positive for back pain and myalgias. Negative for neck stiffness.  Skin: Negative.   Neurological: Negative for dizziness, speech difficulty, weakness and headaches.   Blood pressure (!) 151/61, pulse 64, temperature 98.3 F (36.8 C), temperature source Oral, resp. rate 11, SpO2 100 %. Physical Exam  Nursing note and vitals reviewed. Constitutional: She is oriented to person, place, and time. She appears well-developed and well-nourished. No distress.  HENT:  Head: Normocephalic and atraumatic.  Mouth/Throat: Oropharynx is clear and moist.  Eyes: Pupils are equal, round, and reactive to light. Conjunctivae and EOM are normal. No scleral icterus.  Neck: No JVD present.  Cardiovascular: Normal rate, regular rhythm and normal heart sounds.  No murmur heard. Respiratory: Effort normal and breath sounds normal. She has no wheezes. She has  no rales.  GI: Soft. Bowel sounds are normal. She exhibits no mass. There is abdominal tenderness.  Left upper quadrant  Musculoskeletal:        General: No edema.     Cervical back: Normal range of motion and neck supple.  Neurological: She is alert and oriented to person, place, and time.  Skin: Skin is warm and dry. There is pallor.  Psychiatric: She has a normal mood and affect.    Assessment/Plan: 1.  Acute kidney injury on chronic kidney disease stage III: The available history, timeline of events and database points to acute kidney injury with differentials including hemodynamically mediated acute renal injury in the setting of UTI from decreased oral intake/ongoing ARB, acute interstitial nephritis from Augmentin versus urinary tract infection itself and hypercalcemia induced acute kidney injury.  Renal ultrasound does not show any obstructive pathology and I will send off for urine electrolytes as we will begin volume expansion with normal saline at 150 cc/h, will check CPK level on statin with history of flank pain.  She does not have any evidence of volume overload or uremic signs or symptoms.  Continue to avoid iodinated intravenous contrast/NSAIDs at this time.  If renal function does not improve as anticipated in the next 24 to 48 hours, renal biopsy may be warranted to evaluate for AIN before corticosteroid exposure. 2.  Hypercalcemia: Index of suspicion that this indeed may be paraneoplastic given concomitant anemia.  I will check serum protein electrophoresis, free light chains, PTH and 25-hydroxy vitamin D level.  She does not take calcium supplement or antacids.  Will treat with normal saline for forced diuresis/calcium dumping.  Will check phosphorus with labs tomorrow, PTH RP checked by hospitalist service. 3.  Hypertension: Discontinue losartan and resume hydralazine 100 mg 3 times daily at this time given acute kidney injury and monitor with volume expansion to decide on  additional antihypertensive therapy. 4.  Anemia: Appears to be rather acute with hemoglobin having declined from 11.3 five months ago now to 8.5 without any reported overt blood loss.  This again raises concern for neoplasm and will await SPEP/free light chains along with iron studies. 5.  Recurrent urinary tract infection: Recommend repeat urine culture to decide on need/choice of antibiotic given incomplete treatment per patient. 6.  Hypothyroidism: Per primary service  Adom Schoeneck K. 01/23/2020, 5:53 PM

## 2020-01-23 NOTE — ED Provider Notes (Signed)
[facility] EMERGENCY DEPARTMENT Provider Note   CSN: [id number] Arrival date & time: 01/23/20  1312     History Chief Complaint  Patient presents with  . Acute Renal Failure    Carolyn Myers is a 84 y.o. female.  HPI Patient sent in for lab work and feeling bad.  Has been seen by PCP for same.  Seen yesterday.  Creatinine had gone up to 5.3 and 5 days before that was 2.5.  2 months ago was 1.9 and a year ago was 1.5.  Has been treated for UTI.  Has been on Augmentin.  No urine rechecked yesterday.  Also has been hypothyroid with a TSH of 92.  States she feels bad all over.  Complaining of right flank pain.  Also right hip pain.  X-ray done yesterday reassuring.  States she has had a decreased appetite.  Somewhat decreased oral intake.  Patient had E. coli UTI on urine culture from January 27 and also on January 7.    Past Medical History:  Diagnosis Date  . Arthritis    "minor in my shoulders" (02/21/2016)  . Chicken pox   . Diverticulitis   . Frequent headaches    "maybe twice/week" (03/12/2016)  . Gout    Right Foot  . History of blood transfusion    "said my blood was low"  . History of gout   . Hyperlipidemia   . Hypertension   . Hypothyroidism   . Migraine    "maybe twice/year" (02/21/2016)  . Palpitations    "doctor thought it was related to my thyroid"  . Sleep apnea    "I don't wear my mask" (02/21/2016)    Patient Active Problem List   Diagnosis Date Noted  . Osteoporosis 09/05/2019  . Frail elderly 08/03/2019  . 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 Hypertensive urgency 08/23/2018  . Atypical chest pain 08/22/2018  . Sleep apnea   . Palpitations   . Migraine   . Hyperlipidemia   . History of gout   . History of blood transfusion   . Frequent headaches   . Diverticulitis   . Chicken pox   . Arthritis   . CKD (chronic kidney disease) stage 3, GFR 30-59 ml/min (HCC) 02/23/2017  . Age-related osteoporosis without current  pathological fracture 02/23/2017  . Esophageal reflux 05/08/2016  . Renal artery stenosis ([place]) 02/22/2016  . Hypothyroidism 02/22/2016  . Chest pain with high risk for cardiac etiology 02/21/2016  . Gout   . SOB (shortness of breath) on exertion 10/06/2015  . Accelerated hypertension 10/06/2015  . Essential hypertension, benign 11/26/2013  . Hyperlipidemia LDL goal <100 11/26/2013    Past Surgical History:  Procedure Laterality Date  . ABDOMINAL HYSTERECTOMY     "for fibroid tumors"  . TUBAL LIGATION       OB History   No obstetric history on file.     Family History  Problem Relation Age of Onset  . Heart disease Mother   . Heart attack Mother        died from it  . Hypertension Mother   . Arthritis Mother   . Stroke Father   . Cancer Maternal Grandmother   . Heart attack Maternal Aunt   . Renal Disease Maternal Aunt   . Multiple myeloma Maternal Uncle   . Emphysema Maternal Uncle   . Heart disease Sister   . Thyroid disease Sister   . Healthy Son  x3  . Healthy Daughter        x5    Social History   Tobacco Use  . Smoking status: Never Smoker  . Smokeless tobacco: Never Used  Substance Use Topics  . Alcohol use: No  . Drug use: No    Home Medications Prior to Admission medications   Medication Sig Start Date End Date Taking? Authorizing Provider  acetaminophen-codeine (TYLENOL #3) 300-30 MG tablet Take 0.5-1 tablets by mouth every 8 (eight) hours as needed for moderate pain. 12/28/19   Copland, Gay Filler, MD  amLODipine (NORVASC) 10 MG tablet Take 1 tablet (10 mg total) by mouth daily. 05/04/18   Copland, Gay Filler, MD  amoxicillin-clavulanate (AUGMENTIN) 500-125 MG tablet Take 1 tablet (500 mg total) by mouth 2 (two) times daily. 01/17/20   Copland, Gay Filler, MD  aspirin EC 81 MG EC tablet Take 1 tablet (81 mg total) by mouth daily. 02/22/16   Brett Canales, PA-C  Carboxymethylcellulose Sodium ([employer] LUBRICANT EYE DROPS PF OP) Place 1-2 drops into  both eyes 3 (three) times daily as needed (for dry eyes).    [provider]  diclofenac Sodium (VOLTAREN) 1 % GEL Apply 2 g topically 4 (four) times daily. Use as needed for painful joints.  Max 32 grams per day 11/22/19   Copland, Gay Filler, MD  hydrALAZINE (APRESOLINE) 100 MG tablet TAKE 1 TABLET BY MOUTH 3 TIMES DAILY. MONITOR YOUR BLOOD PRESSURE, HOLD FOR BP 100/55 AND PULSE 55 11/06/19   Copland, Gay Filler, MD  levothyroxine (SYNTHROID) 88 MCG tablet TAKE 1 TABLET BY MOUTH DAILY BEFORE BREAKFAST. 01/17/20   Copland, Gay Filler, MD  losartan (COZAAR) 50 MG tablet Take 1 tablet (50 mg total) by mouth daily. 05/04/18   Copland, Gay Filler, MD  meclizine (ANTIVERT) 12.5 MG tablet Take 1 tablet (12.5 mg total) by mouth 3 (three) times daily as needed for dizziness. 08/03/19   Copland, Gay Filler, MD  nitroGLYCERIN (NITROSTAT) 0.4 MG SL tablet Place 1 tablet (0.4 mg total) under the tongue every 5 (five) minutes x 3 doses as needed for chest pain. 11/05/16   Belva Crome, MD  ondansetron (ZOFRAN) 4 MG tablet Take 1 tablet (4 mg total) by mouth every 8 (eight) hours as needed for nausea or vomiting. 01/17/20   Copland, Gay Filler, MD  ranitidine (ZANTAC) 300 MG tablet Take 0.5 tablets (150 mg total) by mouth 2 (two) times daily. 05/04/18   Copland, Gay Filler, MD  rosuvastatin (CRESTOR) 10 MG tablet TAKE 1 TABLET BY MOUTH EVERY DAY 11/06/19   Copland, Gay Filler, MD    Allergies    Pineapple  Review of Systems   Review of Systems  Constitutional: Positive for appetite change and fatigue.  HENT: Negative for congestion.   Cardiovascular: Negative for chest pain.  Gastrointestinal: Negative for abdominal distention.  Genitourinary: Positive for flank pain.  Musculoskeletal: Positive for back pain.       Right hip pain.  Neurological: Negative for weakness.    Physical Exam Updated Vital Signs BP (!) 128/52 (BP Location: Right Arm)   Pulse 71   Temp 98.3 F (36.8 C) (Oral)   Resp 16    SpO2 100%   Physical Exam Vitals and nursing note reviewed.  HENT:     Head: Normocephalic.  Eyes:     Pupils: Pupils are equal, round, and reactive to light.  Cardiovascular:     Rate and Rhythm: Regular rhythm.  Pulmonary:  Breath sounds: No wheezing.  Abdominal:     Tenderness: There is no abdominal tenderness.  Genitourinary:    Comments: CVA tenderness on the right. Musculoskeletal:     Comments: Some pain with movement of right hip.  Skin:    General: Skin is warm.  Neurological:     Mental Status: She is alert. Mental status is at baseline.     ED Results / Procedures / Treatments   Labs (all labs ordered are listed, but only abnormal results are displayed) Labs Reviewed  CBC - Abnormal; Notable for the following components:      Result Value   WBC 14.6 (*)    RBC 2.86 (*)    Hemoglobin 8.1 (*)    HCT 25.3 (*)    nRBC 1.1 (*)    All other components within normal limits  BASIC METABOLIC PANEL  URINALYSIS, ROUTINE W REFLEX MICROSCOPIC    EKG None  Radiology DG HIP UNILAT W OR W/O PELVIS 2-3 VIEWS RIGHT  Result Date: 01/22/2020 CLINICAL DATA:  Right hip pain, no history of injury, pain for 3 weeks EXAM: DG HIP (WITH OR WITHOUT PELVIS) 2-3V RIGHT COMPARISON:  None. FINDINGS: Frontal view of the pelvis as well as frontal and frogleg lateral views of the right hip are obtained. No acute displaced fracture. Joint spaces are well preserved. There is prominent spondylosis within the lower lumbar spine greatest at L4-5 and L5-S1. Sacroiliac joints appear normal. IMPRESSION: 1. Unremarkable pelvis and right hip. 2. Lower lumbar spondylosis. Electronically Signed   By: Randa Ngo M.D.   On: 01/22/2020 16:37    Procedures Procedures (including critical care time)  Medications Ordered in ED Medications  lactated ringers bolus 500 mL (has no administration in time range)    ED Course  I have reviewed the triage vital signs and the nursing notes.  Pertinent  labs & imaging results that were available during my care of the patient were reviewed by me and considered in my medical decision making (see chart for details).    MDM Rules/Calculators/A&P                      Patient with acute kidney injury.  Also very elevated TSH.  Recent E. coli UTI.  Right flank pain.  Will get ultrasound.  However with worsening kidney function will require admission to the hospital.  Will discuss with hospitalist.  Also mildly worsening anemia. Final Clinical Impression(s) / ED Diagnoses Final diagnoses:  AKI (acute kidney injury) ([facility])  Anemia, unspecified type  Hypothyroidism, unspecified type    Rx / DC Orders ED Discharge Orders    None       Davonna Belling, MD 01/23/20 1418

## 2020-01-24 DIAGNOSIS — D649 Anemia, unspecified: Secondary | ICD-10-CM

## 2020-01-24 DIAGNOSIS — N179 Acute kidney failure, unspecified: Secondary | ICD-10-CM

## 2020-01-24 DIAGNOSIS — I10 Essential (primary) hypertension: Secondary | ICD-10-CM

## 2020-01-24 DIAGNOSIS — N183 Chronic kidney disease, stage 3 unspecified: Secondary | ICD-10-CM

## 2020-01-24 LAB — RENAL FUNCTION PANEL
Albumin: 2.9 g/dL — ABNORMAL LOW (ref 3.5–5.0)
Anion gap: 11 (ref 5–15)
BUN: 49 mg/dL — ABNORMAL HIGH (ref 8–23)
CO2: 26 mmol/L (ref 22–32)
Calcium: 11 mg/dL — ABNORMAL HIGH (ref 8.9–10.3)
Chloride: 102 mmol/L (ref 98–111)
Creatinine, Ser: 4.77 mg/dL — ABNORMAL HIGH (ref 0.44–1.00)
GFR calc Af Amer: 9 mL/min — ABNORMAL LOW (ref >60)
GFR calc non Af Amer: 8 mL/min — ABNORMAL LOW (ref >60)
Glucose, Bld: 100 mg/dL — ABNORMAL HIGH (ref 70–99)
Phosphorus: 5.1 mg/dL — ABNORMAL HIGH (ref 2.5–4.6)
Potassium: 3.7 mmol/L (ref 3.5–5.1)
Sodium: 139 mmol/L (ref 135–145)

## 2020-01-24 LAB — CBC
HCT: 20.9 % — ABNORMAL LOW (ref 36.0–46.0)
Hemoglobin: 6.7 g/dL — CL (ref 12.0–15.0)
MCH: 28.5 pg (ref 26.0–34.0)
MCHC: 32.1 g/dL (ref 30.0–36.0)
MCV: 88.9 fL (ref 80.0–100.0)
Platelets: 138 10*3/uL — ABNORMAL LOW (ref 150–400)
RBC: 2.35 MIL/uL — ABNORMAL LOW (ref 3.87–5.11)
RDW: 14.6 % (ref 11.5–15.5)
WBC: 7.9 10*3/uL (ref 4.0–10.5)
nRBC: 1.8 % — ABNORMAL HIGH (ref 0.0–0.2)

## 2020-01-24 LAB — PROTEIN ELECTROPHORESIS, SERUM
A/G Ratio: 1.1 (ref 0.7–1.7)
Albumin ELP: 3.7 g/dL (ref 2.9–4.4)
Alpha-1-Globulin: 0.3 g/dL (ref 0.0–0.4)
Alpha-2-Globulin: 0.8 g/dL (ref 0.4–1.0)
Beta Globulin: 1 g/dL (ref 0.7–1.3)
Gamma Globulin: 1.2 g/dL (ref 0.4–1.8)
Globulin, Total: 3.3 g/dL (ref 2.2–3.9)
M-Spike, %: 0.6 g/dL — ABNORMAL HIGH
Total Protein ELP: 7 g/dL (ref 6.0–8.5)

## 2020-01-24 LAB — HEMOGLOBIN AND HEMATOCRIT, BLOOD
HCT: 29.3 % — ABNORMAL LOW (ref 36.0–46.0)
Hemoglobin: 9.6 g/dL — ABNORMAL LOW (ref 12.0–15.0)

## 2020-01-24 LAB — PTH, INTACT AND CALCIUM
Calcium, Total (PTH): 11.4 mg/dL — ABNORMAL HIGH (ref 8.7–10.3)
PTH: 24 pg/mL (ref 15–65)

## 2020-01-24 LAB — ABO/RH: ABO/RH(D): O POS

## 2020-01-24 LAB — URINE CULTURE
Culture: NO GROWTH
Special Requests: NORMAL

## 2020-01-24 LAB — PREPARE RBC (CROSSMATCH)

## 2020-01-24 LAB — KAPPA/LAMBDA LIGHT CHAINS
Kappa free light chain: 49.9 mg/L — ABNORMAL HIGH (ref 3.3–19.4)
Kappa, lambda light chain ratio: UNDETERMINED

## 2020-01-24 MED ORDER — SODIUM CHLORIDE 0.9% IV SOLUTION: Freq: Once | INTRAVENOUS | Status: AC

## 2020-01-24 NOTE — Progress Notes (Signed)
RN paged Carolyn Myers via [facility] to notify MD of critical Hgb of 6.7 per lab.

## 2020-01-24 NOTE — Progress Notes (Signed)
PT Cancellation Note  Patient Details Name: Carolyn Myers MRN: [id number] DOB: 1936/08/23   Cancelled Treatment:    Reason Eval/Treat Not Completed: Medical issues which prohibited therapy. Per RN, pt is about to receive 2 units PRBC, and would benefit from waiting on PT evaluation until this afternoon. PT will continue to follow and evaluate as appropriate.   Karma Ganja, PT, DPT   Acute Rehabilitation Department Pager #: [phone]   Otho Bellows 01/24/2020, 10:40 AM

## 2020-01-24 NOTE — Progress Notes (Cosign Needed Addendum)
APP STUDENT NOTE FOR EDUCATION PURPOSES      [facility] Progress Note    Assessment/ Plan:   Patient is an 84 year old African-American woman with PMH significant for HTN for the past ~20 years, known atherosclerotic bilateral renal artery stenosis with CKD stage III (previously under the care of Dr. Florene Glen until 2018), hypothyroidism, osteoporosis (BMD 08/2019, T score -2.8), dyslipidemia, history of gout and recurrent UTIs with E. coli.  Patient was on losartan, rosuvastatin and augmentin prior to admission.  1. Acute on Chronic Kidney Injury with possible hemodynamically mediated acute renal injury in the setting of UTI from decreased oral intake/ongoing ARB, acute interstitial nephritis from Augmentin versus urinary tract infection itself or hypercalcemia induced acute kidney injury (from malignancy).  If renal function does not improve as anticipated in the next 24 to 48 hours, renal biopsy may be warranted to evaluate for AIN before corticosteroid treatment  -CKD Stage III with baseline Cr (1.7-1.9)  -Recent UTIs: 11/22/2019, 12/28/2019 and 01/17/2020 (failed treatment)  -Renal US: echogenicity consistent with chronic inflammation, no stones/obstruction, right renal cortical  thinning/scarring consistent with prior CT  -Avoid nephrotoxins: iodinated IV contrast and NSAIDs. Discontinue losartan   -renal diet with fluid restriction 1225m  -IVF with concomitant Lasix for calcium excretion   Labs:  -BUN  --> 49 today  -Cr 2.5 (1/27) --> 5.3 --> 4.77 today  -Uric acid 17.1  -Urine sed elevated at 125   -Urine protein total 386, Urine Protein Creatinine Ration 6.04      2. Anemia   -acute in nature given decline from 11 to 8.5 in 5 months without overt blood loss  -Hb 8.5 --> 6.7 today --> getting 2 unit prbc today 2/3  -Iron Panel: Ferritin 914, Fe 93, TIBC 223  -LDH pending, concern for hemolysis in setting of anemia and high uric acid level (17.7)  3.  Hypercalcemia  -concern for paraneoplastic syndrome given anemia  -SPEP/free light chains pending for multiple myeloma work up  -Mammogram from 08/2019 with right breast calcifications needing further imaging/magnification  -Ca 12.6 --> 11  -Vit D 20.67  -PTH 24 & Ca 11.4 --> Non-PTH hypercalcemia per lab result interpretation   -PTH RP pending   -IVF with concomitant Lasix for calcium excretion  4. HTN  -discontinue losartan given setting of AKI   -start hydralazine '100mg'$  TID  5. UTI, recurrent with E. coli on 11/22/2019, 12/28/2019 and 01/17/2020 with incomplete treatment of her latest infection with Augmentin (after the patient did not perceive improvement).   -urine culture pending for antibiotic guidance  -per primary team   6. Hypothyroidism  -TSH 92 on 1/27, this can also in part explain the patient's nephrotic syndrome of 6g PCR 2/2  -Synthroid per primary team      Subjective:   On exam patient was resting in bed in no acute distress. Patient is very pleasant and hopes to see her newest grandson who was born Sunday soon. Of note, patient with strong support system, she has 8 children who "spoil" her.   Patient endorses fatigue, increased weakness, increased cold intolerance, weight loss, decreased appetite, dysgeusia, low back tenderness, and increased urinary frequency for the past two weeks. Patient endorses an unintentional recent weight loss of 6 pounds with decreased appetite. Patient further endorses abdominal pain upon taking a deep breath. Patient endorses using a walker at baseline and feels weaker than normal. Patient denies vision changes, fever, headache, dizziness, syncope, falls, rashes, itching, N/V/D, hiccups, leg swelling, and other  urinary symptoms (burning, pain, itching, overt hematuria), hemoptosis/melana/hematochezia. Patient reports PCP found blood in her urine, however there was no color change. Further, patient denies family history of blood disorders/cancers or  bone/bone marrow cancer.     Objective:   BP (!) 122/46 (BP Location: Right Arm)   Pulse (!) 56   Temp 97.6 F (36.4 C) (Oral)   Resp 20   Ht '5\' 3"'$  (1.6 m)   Wt 73.9 kg   SpO2 95%   BMI 28.86 kg/m   Intake/Output Summary (Last 24 hours) at 01/24/2020 0836 Last data filed at 01/24/2020 0411 Gross per 24 hour  Intake 1480.16 ml  Output 300 ml  Net 1180.16 ml   Weight change:   Physical Exam: Gen: pleasant and cooperative, resting in bed  CVS: RRR, no rubs or murmurs noted  Resp: CTAB, normal work of breathing noted Abd: bowel sounds presents in all 4Q, suprapubic tenderness to palpation  Ext: trace edema on RLE   Imaging: US Renal  Result Date: 01/23/2020 CLINICAL DATA:  Right-sided flank pain for 2 weeks EXAM: RENAL / URINARY TRACT ULTRASOUND COMPLETE COMPARISON:  02/21/2016 FINDINGS: Right Kidney: Renal measurements: 10.6 x 5.0 by 5.1 cm = volume: 141 mL. Diffuse increased renal cortical echotexture and decreased corticomedullary differentiation, compatible with medical renal disease. Multifocal areas of cortical thinning and scarring. Multiple small simple renal cysts measuring up to 1.5 cm. Left Kidney: Renal measurements: 9.5 x 5.9 x 5.0 cm = volume: 148 mL. Diffuse increased renal cortical echotexture and decreased corticomedullary differentiation, compatible with medical renal disease. Bladder: Appears normal for degree of bladder distention. Other: No evidence of urinary tract calculi or obstructive uropathy. IMPRESSION: 1. Increased renal cortical echotexture consistent with medical renal disease. 2. Chronic right renal cortical thinning and scarring, not appreciably changed since prior CT exam. 3. Small simple cysts right kidney. Electronically Signed   By: Randa Ngo M.D.   On: 01/23/2020 15:49   DG HIP UNILAT W OR W/O PELVIS 2-3 VIEWS RIGHT  Result Date: 01/22/2020 CLINICAL DATA:  Right hip pain, no history of injury, pain for 3 weeks EXAM: DG HIP (WITH OR WITHOUT  PELVIS) 2-3V RIGHT COMPARISON:  None. FINDINGS: Frontal view of the pelvis as well as frontal and frogleg lateral views of the right hip are obtained. No acute displaced fracture. Joint spaces are well preserved. There is prominent spondylosis within the lower lumbar spine greatest at L4-5 and L5-S1. Sacroiliac joints appear normal. IMPRESSION: 1. Unremarkable pelvis and right hip. 2. Lower lumbar spondylosis. Electronically Signed   By: Randa Ngo M.D.   On: 01/22/2020 16:37    Labs: BMET Recent Labs  Lab 01/17/20 1533 01/22/20 1613 01/23/20 1323 01/23/20 1724 01/24/20 0523  NA 138 137 138  --  139  K 3.6 3.9 4.0  --  3.7  CL 98 96 95*  --  102  CO2 '26 27 27  '$ --  26  GLUCOSE 117* 132* 114*  --  100*  BUN 24* 52* 53*  --  49*  CREATININE 2.53* 5.33* 5.18*  --  4.77*  CALCIUM 11.9* 12.6* 12.1* 11.4* 11.0*  PHOS  --   --   --   --  5.1*   CBC Recent Labs  Lab 01/17/20 1533 01/22/20 1613 01/23/20 1323 01/24/20 0523  WBC 14.8* 15.1* 14.6* 7.9  HGB 9.2* 8.5 Repeated and verified X2.* 8.1* 6.7*  HCT 28.7* 26.0* 25.3* 20.9*  MCV 86.8 86.1 88.5 88.9  PLT 202.0 184.0 164  138*    Medications:    . sodium chloride   Intravenous Once  . aspirin EC  81 mg Oral Daily  . heparin  5,000 Units Subcutaneous Q12H  . hydrALAZINE  100 mg Oral TID  . levothyroxine  88 mcg Oral Q0600  . rosuvastatin  10 mg Oral QHS     Harrell Gave, PA STUDENT 01/24/2020, 8:36 AM

## 2020-01-24 NOTE — Progress Notes (Signed)
PROGRESS NOTE    Carolyn Myers  HGD:[id number] DOB: [birth year] DOA: 01/23/2020 PCP: Darreld Mclean, MD   Brief Narrative:  HPI on 01/23/2020 by Dr. Wynetta Fines Carolyn Myers is a 84 y.o. female with medical history significant of CKD stage III, bilateral renal artery stenosis, chronic right hip pain, hypertension, gout, thyroidism, presented with worsening of right hip pain, and AKI.  Patient was recently treated for E. coli UTI, by her PCP on 01/17/2020 with Augmentin.  Before that she was evaluated by orthopedic surgery for worsening of her right hip pain and was placed on Tylenol 3, and image study showed no significant right hip pathology but some lumbar vertebral arthritis.  He went to see her PCP yesterday and blood work showed significant elevation of creatinine level from her baseline 1.92 to 5.3.  She denied taking any NSAIDs on a regular basis. Regarding the right hip pain, she said the nature has been aching and sometimes shooting down to the back side of her right leg, she has been using a walker for ambulation.  Interim history Admitted for acute kidney injury, nephrology consulted and appreciated.  Patient noted to have UTI and has completed Augmentin therapy prior to admission.  Only complaining of right hip and back pain.  Patient noted to have a hemoglobin of 6.7 this morning, 2 units of PRBC have been ordered for transfusion. Assessment & Plan   Acute kidney injury on chronic kidney disease, stage IIIb -Possibly secondary to urinary tract infection, decreased oral intake, with ARB use, possible AIN from Augmentin use versus hypercalcemia -Creatinine on admission 5.18 -Renal ultrasound shows no obstructive pathology -Creatinine currently down to 4.77 -Nephrology consulted and appreciated -Currently on IVF  Anemia of chronic disease -Hemoglobin currently 6.7, 2 units PRBC ordered for transfusion -FOBT pending -Anemia panel shows adequate iron and  storage -Continue to monitor CBC  Recurrent UTI -Patient recently completed Augmentin -UA showed few bacteria, 11-20 WBC, large leukocytes, negative nitrites -Urine culture pending -Has been placed on ceftriaxone  Hypercalcemia -PTH 24 (WNL) and pending PTHrP -Nephrology ruling out multiple myeloma-serum protein electrophoresis, free light chains pending -25-hydroxy vitamin D 20.67 (low)  Right hip pain -?  Referred pain, secondary to sciatica -Trial of gabapentin once kidney function has stabilized  Gout -uric acid 17.1  Essential Hypertension -ARB held acute kidney injury -Continue hydralazine  Hypothyroidism -Continue Synthroid  DVT Prophylaxis Heparin  Code Status: DNR  Family Communication: None at bedside  Disposition Plan: Admitted.  Patient from home, presented with hip pain.  Found to have worsening creatinine on chronic kidney disease, stage III.  Nephrology consulted and appreciated, currently on IV fluids.  Suspect disposition we back to home when stable.   Consultants Nephrology  Procedures  Renal ultrasound  Antibiotics   Anti-infectives (From admission, onward)   Start     Dose/Rate Route Frequency Ordered Stop   01/23/20 1930  cefTRIAXone (ROCEPHIN) 1 g in sodium chloride 0.9 % 100 mL IVPB     1 g 200 mL/hr over 30 Minutes Intravenous Every 24 hours 01/23/20 1919 01/28/20 1929      Subjective:   Carolyn Myers seen and examined today.  Pains of right hip pain and back pain.  States she does not feel she can walk.   Does complain of poor appetite.  Denies current chest pain or shortness of breath, abdominal pain, nausea or vomiting, dizziness or headache.  Objective:   Vitals:   01/24/20 0408 01/24/20 0720 01/24/20 0945 01/24/20  1051  BP: (!) 119/45 (!) 122/46 (!) 123/50 (!) 128/50  Pulse: 63 (!) 56 60 (!) 59  Resp: 17 20    Temp: 98.9 F (37.2 C) 97.6 F (36.4 C)  98.9 F (37.2 C)  TempSrc: Oral Oral  Oral  SpO2: 100% 95% 98% 95%   Weight:      Height:        Intake/Output Summary (Last 24 hours) at 01/24/2020 1059 Last data filed at 01/24/2020 1000 Gross per 24 hour  Intake 1630.16 ml  Output 300 ml  Net 1330.16 ml   Filed Weights   01/23/20 2155 01/24/20 0408  Weight: 73.3 kg 73.9 kg    Exam  General: Well developed, well nourished, NAD, appears stated age  4: NCAT, mucous membranes moist.   Cardiovascular: S1 S2 auscultated, SEM, RRR  Respiratory: Clear to auscultation bilaterally  Abdomen: Soft, mildly TTP LUQ, nondistended, + bowel sounds  Extremities: warm dry without cyanosis clubbing or edema  Neuro: AAOx3, nonfocal  Psych: Appropriate mood and affect, pleasant   Data Reviewed: I have personally reviewed following labs and imaging studies  CBC: Recent Labs  Lab 01/17/20 1533 01/22/20 1613 01/23/20 1323 01/24/20 0523  WBC 14.8* 15.1* 14.6* 7.9  HGB 9.2* 8.5 Repeated and verified X2.* 8.1* 6.7*  HCT 28.7* 26.0* 25.3* 20.9*  MCV 86.8 86.1 88.5 88.9  PLT 202.0 184.0 164 619*   Basic Metabolic Panel: Recent Labs  Lab 01/17/20 1533 01/22/20 1613 01/23/20 1323 01/23/20 1724 01/24/20 0523  NA 138 137 138  --  139  K 3.6 3.9 4.0  --  3.7  CL 98 96 95*  --  102  CO2 '26 27 27  '$ --  26  GLUCOSE 117* 132* 114*  --  100*  BUN 24* 52* 53*  --  49*  CREATININE 2.53* 5.33* 5.18*  --  4.77*  CALCIUM 11.9* 12.6* 12.1* 11.4* 11.0*  PHOS  --   --   --   --  5.1*   GFR: Estimated Creatinine Clearance: 8.9 mL/min (A) (by C-G formula based on SCr of 4.77 mg/dL (H)). Liver Function Tests: Recent Labs  Lab 01/17/20 1533 01/24/20 0523  AST 24  --   ALT 6  --   ALKPHOS 80  --   BILITOT 0.6  --   PROT 7.4  --   ALBUMIN 4.3 2.9*   No results for input(s): LIPASE, AMYLASE in the last 168 hours. No results for input(s): AMMONIA in the last 168 hours. Coagulation Profile: No results for input(s): INR, PROTIME in the last 168 hours. Cardiac Enzymes: Recent Labs  Lab 01/23/20 1850   CKTOTAL 91   BNP (last 3 results) No results for input(s): PROBNP in the last 8760 hours. HbA1C: No results for input(s): HGBA1C in the last 72 hours. CBG: No results for input(s): GLUCAP in the last 168 hours. Lipid Profile: No results for input(s): CHOL, HDL, LDLCALC, TRIG, CHOLHDL, LDLDIRECT in the last 72 hours. Thyroid Function Tests: No results for input(s): TSH, T4TOTAL, FREET4, T3FREE, THYROIDAB in the last 72 hours. Anemia Panel: Recent Labs    01/23/20 1850  FERRITIN 914*  TIBC 223*  IRON 93   Urine analysis:    Component Value Date/Time   COLORURINE YELLOW 01/23/2020 1855   APPEARANCEUR CLOUDY (A) 01/23/2020 1855   LABSPEC 1.012 01/23/2020 1855   PHURINE 8.0 01/23/2020 1855   GLUCOSEU NEGATIVE 01/23/2020 1855   HGBUR NEGATIVE 01/23/2020 1855   BILIRUBINUR NEGATIVE 01/23/2020 1855  BILIRUBINUR negative 01/17/2020 1535   KETONESUR NEGATIVE 01/23/2020 1855   PROTEINUR 30 (A) 01/23/2020 1855   UROBILINOGEN 0.2 01/17/2020 1535   NITRITE NEGATIVE 01/23/2020 1855   LEUKOCYTESUR LARGE (A) 01/23/2020 1855   Sepsis Labs: '@LABRCNTIP'$ (procalcitonin:4,lacticidven:4)  ) Recent Results (from the past 240 hour(s))  Urine Culture     Status: Abnormal   Collection Time: 01/17/20  3:14 PM   Specimen: Urine  Result Value Ref Range Status   MICRO NUMBER: [id number]  Final   SPECIMEN QUALITY: Adequate  Final   Sample Source NOT GIVEN  Final   STATUS: FINAL  Final   ISOLATE 1: Escherichia coli (A)  Final    Comment: Greater than 100,000 CFU/mL of Escherichia coli      Susceptibility   Escherichia coli - URINE CULTURE, REFLEX    AMOX/CLAVULANIC <=2 Sensitive     AMPICILLIN 8 Sensitive     AMPICILLIN/SULBACTAM <=2 Sensitive     CEFAZOLIN* <=4 Not Reportable      * For infections other than uncomplicated UTIcaused by E. coli, K. pneumoniae or P. mirabilis:Cefazolin is resistant if MIC > or = 8 mcg/mL.(Distinguishing susceptible versus intermediatefor isolates with MIC < or  = 4 mcg/mL requiresadditional testing.)For uncomplicated UTI caused by E. coli,K. pneumoniae or P. mirabilis: Cefazolin issusceptible if MIC <32 mcg/mL and predictssusceptible to the oral agents cefaclor, cefdinir,cefpodoxime, cefprozil, cefuroxime, cephalexinand loracarbef.    CEFEPIME <=1 Sensitive     CEFTRIAXONE <=1 Sensitive     CIPROFLOXACIN >=4 Resistant     LEVOFLOXACIN >=8 Resistant     ERTAPENEM <=0.5 Sensitive     GENTAMICIN <=1 Sensitive     IMIPENEM <=0.25 Sensitive     NITROFURANTOIN <=16 Sensitive     PIP/TAZO <=4 Sensitive     TOBRAMYCIN <=1 Sensitive     TRIMETH/SULFA* <=20 Sensitive      * For infections other than uncomplicated UTIcaused by E. coli, K. pneumoniae or P. mirabilis:Cefazolin is resistant if MIC > or = 8 mcg/mL.(Distinguishing susceptible versus intermediatefor isolates with MIC < or = 4 mcg/mL requiresadditional testing.)For uncomplicated UTI caused by E. coli,K. pneumoniae or P. mirabilis: Cefazolin issusceptible if MIC <32 mcg/mL and predictssusceptible to the oral agents cefaclor, cefdinir,cefpodoxime, cefprozil, cefuroxime, cephalexinand loracarbef.Legend:S = Susceptible  I = IntermediateR = Resistant  NS = Not susceptible* = Not tested  NR = Not reported**NN = See antimicrobic comments  SARS CORONAVIRUS 2 (TAT 6-24 HRS) Nasopharyngeal Nasopharyngeal Swab     Status: None   Collection Time: 01/23/20  3:06 PM   Specimen: Nasopharyngeal Swab  Result Value Ref Range Status   SARS Coronavirus 2 NEGATIVE NEGATIVE Final    Comment: (NOTE) SARS-CoV-2 target nucleic acids are NOT DETECTED. The SARS-CoV-2 RNA is generally detectable in upper and lower respiratory specimens during the acute phase of infection. Negative results do not preclude SARS-CoV-2 infection, do not rule out co-infections with other pathogens, and should not be used as the sole basis for treatment or other patient management decisions. Negative results must be combined with clinical  observations, patient history, and epidemiological information. The expected result is Negative. Fact Sheet for Patients: SugarRoll.be Fact Sheet for Healthcare Providers: https://www.woods-mathews.com/ This test is not yet approved or cleared by the [place] FDA and  has been authorized for detection and/or diagnosis of SARS-CoV-2 by FDA under an Emergency Use Authorization (EUA). This EUA will remain  in effect (meaning this test can be used) for the duration of the COVID-19 declaration under Section 56 4(b)(1) of  the Act, 21 U.S.C. section 360bbb-3(b)(1), unless the authorization is terminated or revoked sooner. Performed at [facility], Susan Moore [address], [place], [place] [address]       Radiology Studies: US Renal  Result Date: 01/23/2020 CLINICAL DATA:  Right-sided flank pain for 2 weeks EXAM: RENAL / URINARY TRACT ULTRASOUND COMPLETE COMPARISON:  02/21/2016 FINDINGS: Right Kidney: Renal measurements: 10.6 x 5.0 by 5.1 cm = volume: 141 mL. Diffuse increased renal cortical echotexture and decreased corticomedullary differentiation, compatible with medical renal disease. Multifocal areas of cortical thinning and scarring. Multiple small simple renal cysts measuring up to 1.5 cm. Left Kidney: Renal measurements: 9.5 x 5.9 x 5.0 cm = volume: 148 mL. Diffuse increased renal cortical echotexture and decreased corticomedullary differentiation, compatible with medical renal disease. Bladder: Appears normal for degree of bladder distention. Other: No evidence of urinary tract calculi or obstructive uropathy. IMPRESSION: 1. Increased renal cortical echotexture consistent with medical renal disease. 2. Chronic right renal cortical thinning and scarring, not appreciably changed since prior CT exam. 3. Small simple cysts right kidney. Electronically Signed   By: Randa Ngo M.D.   On: 01/23/2020 15:49   DG HIP UNILAT W OR W/O PELVIS 2-3 VIEWS  RIGHT  Result Date: 01/22/2020 CLINICAL DATA:  Right hip pain, no history of injury, pain for 3 weeks EXAM: DG HIP (WITH OR WITHOUT PELVIS) 2-3V RIGHT COMPARISON:  None. FINDINGS: Frontal view of the pelvis as well as frontal and frogleg lateral views of the right hip are obtained. No acute displaced fracture. Joint spaces are well preserved. There is prominent spondylosis within the lower lumbar spine greatest at L4-5 and L5-S1. Sacroiliac joints appear normal. IMPRESSION: 1. Unremarkable pelvis and right hip. 2. Lower lumbar spondylosis. Electronically Signed   By: Randa Ngo M.D.   On: 01/22/2020 16:37     Scheduled Meds:  sodium chloride   Intravenous Once   aspirin EC  81 mg Oral Daily   heparin  5,000 Units Subcutaneous Q12H   hydrALAZINE  100 mg Oral TID   levothyroxine  88 mcg Oral Q0600   rosuvastatin  10 mg Oral QHS   Continuous Infusions:  sodium chloride 100 mL/hr at 01/24/20 0316   cefTRIAXone (ROCEPHIN)  IV Stopped (01/23/20 2118)     LOS: 1 day   Time Spent in minutes   45 minutes  Patrici Minnis D.O. on 01/24/2020 at 10:59 AM  Between 7am to 7pm - Please see pager noted on [email or web address]  After 7pm go to [email or web address]  And look for the night coverage person covering for me after hours  [facility] Office  [phone]

## 2020-01-24 NOTE — Progress Notes (Signed)
Notified by blood bank blood ready at 0950  Paged Mikhail at [phone].Held 10AM Hydralazine per parameters BP 123/50 (DBP low).BP now 155/63, HR 55;per parameter will hold. Upon callback ok to give Hydralazine.

## 2020-01-24 NOTE — Progress Notes (Signed)
[facility] Progress Note    Assessment/ Plan:    1.  Acute kidney injury on chronic kidney disease stage III: Baseline Cr appears to be 1.7-1.9.  The available history, timeline of events and database points to acute kidney injury with differentials including hemodynamically mediated acute renal injury in the setting of UTI from decreased oral intake/ongoing ARB, acute interstitial nephritis from Augmentin versus urinary tract infection itself and hypercalcemia induced acute kidney injury.  Renal ultrasound does not show any obstructive pathology.  UA with only 30 mg proteinuria but UP/C is 6 g. Combined with anemia and hypercalcemia I'm worried she may have MM, but SPEP hasn't resulted yet.  If I don't see marked improvement in renal function, will likely need a biopsy.  I have stopped ASA 81 mg daily in preparation.  2.  Hypercalcemia: Index of suspicion that this indeed may be paraneoplastic given concomitant anemia.  PTH 24, Vit D 20.  Phos 5.1.  SPEP, free light chains, PTHrP are pending.  Improving with IVFs and Lasix.    3.  Hypertension: Discontinue losartan and resume hydralazine 100 mg 3 times daily at this time given acute kidney injury and monitor with volume expansion to decide on additional antihypertensive therapy.  4.  Anemia: Appears to be rather acute with hemoglobin having declined from 11.3 five months ago now to 8.5 without any reported overt blood loss.  This again raises concern for neoplasm and will await SPEP/free light chains along with iron studies.  5.  Recurrent urinary tract infection: Recommend repeat urine culture to decide on need/choice of antibiotic given incomplete treatment per patient.  6.  Hypothyroidism: Per primary service-- TSH on 1/27 was very high- 92 which can also contribute to proteinuria and hypercalcemia  7.  Gout: Uric acid 17.1, Phos 5.1, Tbili normal 1/27, will check LDH  Subjective:    Seen in room. Ca getting better with  IVFs.  To have pRBCs today for anemia.  Cr coming down, UP/C resulted at 6 g.     Objective:   BP (!) 127/52   Pulse (!) 58   Temp 98 F (36.7 C) (Oral)   Resp 18   Ht 5\' 3"  (1.6 m)   Wt 73.9 kg   SpO2 97%   BMI 28.86 kg/m   Intake/Output Summary (Last 24 hours) at 01/24/2020 1146 Last data filed at 01/24/2020 1000 Gross per 24 hour  Intake 1630.16 ml  Output 300 ml  Net 1330.16 ml   Weight change:   Physical Exam: Gen: sitting in bed, NAD, on the phone CVS: RRR no m/r/g Resp: clear bilaterally Abd: soft, nontender NABS Ext: no LE edema  Imaging: US Renal  Result Date: 01/23/2020 CLINICAL DATA:  Right-sided flank pain for 2 weeks EXAM: RENAL / URINARY TRACT ULTRASOUND COMPLETE COMPARISON:  02/21/2016 FINDINGS: Right Kidney: Renal measurements: 10.6 x 5.0 by 5.1 cm = volume: 141 mL. Diffuse increased renal cortical echotexture and decreased corticomedullary differentiation, compatible with medical renal disease. Multifocal areas of cortical thinning and scarring. Multiple small simple renal cysts measuring up to 1.5 cm. Left Kidney: Renal measurements: 9.5 x 5.9 x 5.0 cm = volume: 148 mL. Diffuse increased renal cortical echotexture and decreased corticomedullary differentiation, compatible with medical renal disease. Bladder: Appears normal for degree of bladder distention. Other: No evidence of urinary tract calculi or obstructive uropathy. IMPRESSION: 1. Increased renal cortical echotexture consistent with medical renal disease. 2. Chronic right renal cortical thinning and scarring, not appreciably changed since prior  CT exam. 3. Small simple cysts right kidney. Electronically Signed   By: Randa Ngo M.D.   On: 01/23/2020 15:49   DG HIP UNILAT W OR W/O PELVIS 2-3 VIEWS RIGHT  Result Date: 01/22/2020 CLINICAL DATA:  Right hip pain, no history of injury, pain for 3 weeks EXAM: DG HIP (WITH OR WITHOUT PELVIS) 2-3V RIGHT COMPARISON:  None. FINDINGS: Frontal view of the pelvis as well  as frontal and frogleg lateral views of the right hip are obtained. No acute displaced fracture. Joint spaces are well preserved. There is prominent spondylosis within the lower lumbar spine greatest at L4-5 and L5-S1. Sacroiliac joints appear normal. IMPRESSION: 1. Unremarkable pelvis and right hip. 2. Lower lumbar spondylosis. Electronically Signed   By: Randa Ngo M.D.   On: 01/22/2020 16:37    Labs: BMET Recent Labs  Lab 01/17/20 1533 01/22/20 1613 01/23/20 1323 01/23/20 1724 01/24/20 0523  NA 138 137 138  --  139  K 3.6 3.9 4.0  --  3.7  CL 98 96 95*  --  102  CO2 26 27 27   --  26  GLUCOSE 117* 132* 114*  --  100*  BUN 24* 52* 53*  --  49*  CREATININE 2.53* 5.33* 5.18*  --  4.77*  CALCIUM 11.9* 12.6* 12.1* 11.4* 11.0*  PHOS  --   --   --   --  5.1*   CBC Recent Labs  Lab 01/17/20 1533 01/22/20 1613 01/23/20 1323 01/24/20 0523  WBC 14.8* 15.1* 14.6* 7.9  HGB 9.2* 8.5 Repeated and verified X2.* 8.1* 6.7*  HCT 28.7* 26.0* 25.3* 20.9*  MCV 86.8 86.1 88.5 88.9  PLT 202.0 184.0 164 138*    Medications:    . sodium chloride   Intravenous Once  . heparin  5,000 Units Subcutaneous Q12H  . hydrALAZINE  100 mg Oral TID  . levothyroxine  88 mcg Oral Q0600  . rosuvastatin  10 mg Oral QHS     Madelon Lips MD 01/24/2020, 11:46 AM

## 2020-01-24 NOTE — Evaluation (Signed)
Physical Therapy Evaluation Patient Details Name: Carolyn Myers MRN: [id number] DOB: 1936-07-31 Today's Date: 01/24/2020   History of Present Illness  Pt is an 84 yo female with CKD stage III, bilateral renal artery stenosis, HTN, and gout, who presented with worsening of right hip pain, and AKI following abnormal bloodwork at PCP on 2/1.  Clinical Impression  Pt in bed receiving blood with daughter present upon PT arrival, agreeable to PT evaluation at this time. The pt presents with limitations in functional mobility, endurance, and strength compared to her prior level of function and independence due to above dx. The pt was limited most today by fatigue, but was able to complete bed mobility as well as short ambulation in her room with min guard assist and RW for safety with no LOB. The pt ambulates slowly, but demos good safety awareness. VSS through session. The pt will continue to benefit from skilled PT to maximize rehab and independence prior to d/c home as pt was previously independent, and her children are not currently able to provide as much assist/supervision as the pt currently needs.      Follow Up Recommendations SNF;Supervision/Assistance - 24 hour(short term rehab)    Equipment Recommendations  (pt has rollator at baselin)    Recommendations for Other Services       Precautions / Restrictions Precautions Precautions: Fall Restrictions Weight Bearing Restrictions: No      Mobility  Bed Mobility Overal bed mobility: Needs Assistance Bed Mobility: Supine to Sit     Supine to sit: Supervision     General bed mobility comments: supervision for line management, pt uses elevated HOB and bed rails  Transfers Overall transfer level: Needs assistance Equipment used: Rolling walker (2 wheeled) Transfers: Sit to/from Stand Sit to Stand: Supervision         General transfer comment: no LOB, supervision for lines and safety, VCs for hand  placement  Ambulation/Gait Ambulation/Gait assistance: Min guard Gait Distance (Feet): 20 Feet Assistive device: Rolling walker (2 wheeled) Gait Pattern/deviations: Step-through pattern;Decreased stride length;Shuffle;Trunk flexed Gait velocity: decreased Gait velocity interpretation: <1.31 ft/sec, indicative of household ambulator General Gait Details: Pt ambulates with trunk flexion and minimal foot clearance bilaterally, no LOB, very slow movements  Stairs            Wheelchair Mobility    Modified Rankin (Stroke Patients Only)       Balance Overall balance assessment: Needs assistance Sitting-balance support: No upper extremity supported;Feet supported Sitting balance-Leahy Scale: Fair Sitting balance - Comments: able to maintain without UE support, prefers UE support     Standing balance-Leahy Scale: Poor Standing balance comment: reliant on BUE support for ambulation                             Pertinent Vitals/Pain Pain Assessment: No/denies pain    Home Living Family/patient expects to be discharged to:: Private residence Living Arrangements: Alone Available Help at Discharge: Family;Available PRN/intermittently Type of Home: Apartment Home Access: Level entry     Home Layout: One level Home Equipment: Walker - 4 wheels;Shower seat;Grab bars - tub/shower;Hand held shower head;Grab bars - toilet      Prior Function Level of Independence: Needs assistance   Gait / Transfers Assistance Needed: rollator in home and outside  ADL's / Homemaking Assistance Needed: son helps with cooking, son and daughter assist with dressing, bathing, total assist. Children run errands  Comments: daughter arrives at 3:30 pm through  evening, son arrives at 10pm and stays overnight until 8:30 am.     Hand Dominance   Dominant Hand: Right    Extremity/Trunk Assessment   Upper Extremity Assessment Upper Extremity Assessment: Defer to OT evaluation;Overall  WFL for tasks assessed    Lower Extremity Assessment Lower Extremity Assessment: Overall WFL for tasks assessed;Generalized weakness(functional, but lacking endurance)    Cervical / Trunk Assessment Cervical / Trunk Assessment: Kyphotic  Communication   Communication: No difficulties  Cognition Arousal/Alertness: Awake/alert Behavior During Therapy: WFL for tasks assessed/performed Overall Cognitive Status: Within Functional Limits for tasks assessed                                        General Comments      Exercises     Assessment/Plan    PT Assessment Patient needs continued PT services  PT Problem List Decreased strength;Decreased mobility;Decreased range of motion;Decreased coordination;Decreased activity tolerance;Cardiopulmonary status limiting activity;Decreased balance       PT Treatment Interventions DME instruction;Therapeutic exercise;Gait training;Balance training;Stair training;Functional mobility training;Therapeutic activities;Patient/family education    PT Goals (Current goals can be found in the Care Plan section)  Acute Rehab PT Goals Patient Stated Goal: return home PT Goal Formulation: With patient/family Time For Goal Achievement: 02/07/20 Potential to Achieve Goals: Good    Frequency Min 2X/week   Barriers to discharge Decreased caregiver support daughter and son cannot provide 24/7 supervision    Co-evaluation               AM-PAC PT "6 Clicks" Mobility  Outcome Measure Help needed turning from your back to your side while in a flat bed without using bedrails?: A Little Help needed moving from lying on your back to sitting on the side of a flat bed without using bedrails?: A Little Help needed moving to and from a bed to a chair (including a wheelchair)?: A Little Help needed standing up from a chair using your arms (e.g., wheelchair or bedside chair)?: A Little Help needed to walk in hospital room?: A Little Help  needed climbing 3-5 steps with a railing? : A Lot 6 Click Score: 17    End of Session Equipment Utilized During Treatment: Gait belt Activity Tolerance: Patient tolerated treatment well;No increased pain;Patient limited by fatigue Patient left: in bed;with family/visitor present;with call bell/phone within reach;with bed alarm set Nurse Communication: Mobility status PT Visit Diagnosis: Difficulty in walking, not elsewhere classified (R26.2);Muscle weakness (generalized) (M62.81)    Time: 1427-1530 PT Time Calculation (min) (ACUTE ONLY): 63 min   Charges:   PT Evaluation $PT Eval Moderate Complexity: 1 Mod PT Treatments $Gait Training: 23-37 mins $Therapeutic Activity: 8-22 mins        Karma Ganja, PT, DPT   Acute Rehabilitation Department Pager #: [phone]  Otho Bellows 01/24/2020, 4:25 PM

## 2020-01-25 ENCOUNTER — Inpatient Hospital Stay (HOSPITAL_COMMUNITY): Payer: Medicare Other

## 2020-01-25 LAB — TYPE AND SCREEN
ABO/RH(D): O POS
Antibody Screen: NEGATIVE
Unit division: 0
Unit division: 0

## 2020-01-25 LAB — CBC
HCT: 29.4 % — ABNORMAL LOW (ref 36.0–46.0)
Hemoglobin: 9.6 g/dL — ABNORMAL LOW (ref 12.0–15.0)
MCH: 27.5 pg (ref 26.0–34.0)
MCHC: 32.7 g/dL (ref 30.0–36.0)
MCV: 84.2 fL (ref 80.0–100.0)
Platelets: 129 10*3/uL — ABNORMAL LOW (ref 150–400)
RBC: 3.49 MIL/uL — ABNORMAL LOW (ref 3.87–5.11)
RDW: 16.3 % — ABNORMAL HIGH (ref 11.5–15.5)
WBC: 8.8 10*3/uL (ref 4.0–10.5)
nRBC: 2.1 % — ABNORMAL HIGH (ref 0.0–0.2)

## 2020-01-25 LAB — RENAL FUNCTION PANEL
Albumin: 2.9 g/dL — ABNORMAL LOW (ref 3.5–5.0)
Anion gap: 11 (ref 5–15)
BUN: 44 mg/dL — ABNORMAL HIGH (ref 8–23)
CO2: 24 mmol/L (ref 22–32)
Calcium: 10.8 mg/dL — ABNORMAL HIGH (ref 8.9–10.3)
Chloride: 102 mmol/L (ref 98–111)
Creatinine, Ser: 3.94 mg/dL — ABNORMAL HIGH (ref 0.44–1.00)
GFR calc Af Amer: 12 mL/min — ABNORMAL LOW (ref >60)
GFR calc non Af Amer: 10 mL/min — ABNORMAL LOW (ref >60)
Glucose, Bld: 99 mg/dL (ref 70–99)
Phosphorus: 4.5 mg/dL (ref 2.5–4.6)
Potassium: 3.3 mmol/L — ABNORMAL LOW (ref 3.5–5.1)
Sodium: 137 mmol/L (ref 135–145)

## 2020-01-25 LAB — BPAM RBC
Blood Product Expiration Date: 202102282359
Blood Product Expiration Date: 202102282359
ISSUE DATE / TIME: 202102031043
ISSUE DATE / TIME: 202102031411
Unit Type and Rh: 5100
Unit Type and Rh: 5100

## 2020-01-25 LAB — MAGNESIUM: Magnesium: 2.2 mg/dL (ref 1.7–2.4)

## 2020-01-25 LAB — LACTATE DEHYDROGENASE: LDH: 302 U/L — ABNORMAL HIGH (ref 98–192)

## 2020-01-25 MED ORDER — POTASSIUM CHLORIDE CRYS ER 20 MEQ PO TBCR
40.0000 meq | EXTENDED_RELEASE_TABLET | Freq: Once | ORAL | Status: AC
  Administered 2020-01-25: 40 meq via ORAL
  Filled 2020-01-25: qty 2

## 2020-01-25 MED ORDER — HEPARIN SODIUM (PORCINE) 5000 UNIT/ML IJ SOLN
5000.0000 [IU] | Freq: Two times a day (BID) | INTRAMUSCULAR | Status: DC
  Administered 2020-01-27: 5000 [IU] via SUBCUTANEOUS
  Filled 2020-01-25: qty 1

## 2020-01-25 MED ORDER — ALPRAZOLAM 0.25 MG PO TABS
0.2500 mg | ORAL_TABLET | Freq: Once | ORAL | Status: AC
  Administered 2020-01-25: 0.25 mg via ORAL
  Filled 2020-01-25: qty 1

## 2020-01-25 NOTE — Progress Notes (Addendum)
PROGRESS NOTE    [name]  [id number] DOB: July 26, 1936 DOA: 01/23/2020 PCP: Darreld Mclean, MD   Brief Narrative:  HPI on 01/23/2020 by Dr. Wynetta Fines Carolyn Myers is a 84 y.o. female with medical history significant of CKD stage III, bilateral renal artery stenosis, chronic right hip pain, hypertension, gout, thyroidism, presented with worsening of right hip pain, and AKI.  Patient was recently treated for E. coli UTI, by her PCP on 01/17/2020 with Augmentin.  Before that she was evaluated by orthopedic surgery for worsening of her right hip pain and was placed on Tylenol 3, and image study showed no significant right hip pathology but some lumbar vertebral arthritis.  He went to see her PCP yesterday and blood work showed significant elevation of creatinine level from her baseline 1.92 to 5.3.  She denied taking any NSAIDs on a regular basis. Regarding the right hip pain, she said the nature has been aching and sometimes shooting down to the back side of her right leg, she has been using a walker for ambulation.  Interim history Admitted for acute kidney injury, nephrology consulted and appreciated.  Patient noted to have UTI and has completed Augmentin therapy prior to admission.  Only complaining of right hip and back pain.  Patient noted to have a hemoglobin of 6.7 this morning, 2 units of PRBC have been ordered for transfusion. Assessment & Plan   Acute kidney injury on chronic kidney disease, stage IIIb -Possibly secondary to urinary tract infection, decreased oral intake, with ARB use, possible AIN from Augmentin use versus hypercalcemia -Creatinine on admission 5.18 -Renal ultrasound shows no obstructive pathology -Creatinine currently down to 3.94 -Nephrology consulted and appreciated -Currently on IVF  Anemia of chronic disease -Hemoglobin was down to 6.7, 2 units PRBC transfused -hemoglobin currently 9.6 -FOBT pending -Anemia panel shows adequate iron and  storage -Continue to monitor CBC  Recurrent UTI -Patient recently completed Augmentin -UA showed few bacteria, 11-20 WBC, large leukocytes, negative nitrites -Urine culture showed no growth -Has been placed on ceftriaxone- will discontinue  Hypercalcemia -PTH 24 (WNL) and pending PTHrP -kappa free chain 49.9; lamda free light chains 12252.6 -Mspike 0.6 -25-hydroxy vitamin D 20.67 (low) -Discussed with nephrology, patient will need a biopsy  Right hip pain/ back pain -?if due to the above -will order bone survey -PT consulted and recommended SNF  Gout -uric acid 17.1  Essential Hypertension -ARB held acute kidney injury -Continue hydralazine  Hypothyroidism -Continue Synthroid  DVT Prophylaxis Heparin  Code Status: DNR  Family Communication: None at bedside  Disposition Plan: Admitted.  Patient from home, presented with hip pain.  Found to have worsening creatinine on chronic kidney disease, stage III.  Nephrology consulted and appreciated, currently on IV fluids.  Will need a biopsy. Disposition possible SNF.  Consultants Nephrology  Procedures  Renal ultrasound  Antibiotics   Anti-infectives (From admission, onward)   Start     Dose/Rate Route Frequency Ordered Stop   01/23/20 1930  cefTRIAXone (ROCEPHIN) 1 g in sodium chloride 0.9 % 100 mL IVPB     1 g 200 mL/hr over 30 Minutes Intravenous Every 24 hours 01/23/20 1919 01/28/20 1929      Subjective:   Carolyn Myers seen and examined today.  States she does not feel much better than when she first came to the hospital. Continues to have hip and back pain. Denies chest pain or shortness of breath.   Objective:   Vitals:   01/24/20 2126 01/24/20 2220  01/25/20 0357 01/25/20 0407  BP: (!) 142/55 (!) 155/58 (!) 174/67   Pulse: (!) 54 (!) 56 (!) 57   Resp:   18   Temp:   98.6 F (37 C)   TempSrc:      SpO2: 99%  98%   Weight:    74.8 kg  Height:        Intake/Output Summary (Last 24 hours) at  01/25/2020 0900 Last data filed at 01/25/2020 0413 Gross per 24 hour  Intake 1817.5 ml  Output 1050 ml  Net 767.5 ml   Filed Weights   01/23/20 2155 01/24/20 0408 01/25/20 0407  Weight: 73.3 kg 73.9 kg 74.8 kg   Exam  General: Well developed, well nourished, elderly, NAD, appears stated age  89: NCAT,mucous membranes moist.   Cardiovascular: S1 S2 auscultated, SEM, RRR  Respiratory: Clear to auscultation bilaterally   Abdomen: Soft, nontender, nondistended, + bowel sounds  Extremities: warm dry without cyanosis clubbing or edema  Neuro: AAOx3, nonfocal  Psych: Depressed mood and affect  Data Reviewed: I have personally reviewed following labs and imaging studies  CBC: Recent Labs  Lab 01/22/20 1613 01/23/20 1323 01/24/20 0523 01/24/20 1930 01/25/20 0331  WBC 15.1* 14.6* 7.9  --  8.8  HGB 8.5 Repeated and verified X2.* 8.1* 6.7* 9.6* 9.6*  HCT 26.0* 25.3* 20.9* 29.3* 29.4*  MCV 86.1 88.5 88.9  --  84.2  PLT 184.0 164 138*  --  [id number]*   Basic Metabolic Panel: Recent Labs  Lab 01/22/20 1613 01/23/20 1323 01/23/20 1724 01/24/20 0523 01/25/20 0331  NA 137 138  --  139 137  K 3.9 4.0  --  3.7 3.3*  CL 96 95*  --  102 102  CO2 27 27  --  26 24  GLUCOSE 132* 114*  --  100* 99  BUN 52* 53*  --  49* 44*  CREATININE 5.33* 5.18*  --  4.77* 3.94*  CALCIUM 12.6* 12.1* 11.4* 11.0* 10.8*  MG  --   --   --   --  2.2  PHOS  --   --   --  5.1* 4.5   GFR: Estimated Creatinine Clearance: 10.9 mL/min (A) (by C-G formula based on SCr of 3.94 mg/dL (H)). Liver Function Tests: Recent Labs  Lab 01/24/20 0523 01/25/20 0331  ALBUMIN 2.9* 2.9*   No results for input(s): LIPASE, AMYLASE in the last 168 hours. No results for input(s): AMMONIA in the last 168 hours. Coagulation Profile: No results for input(s): INR, PROTIME in the last 168 hours. Cardiac Enzymes: Recent Labs  Lab 01/23/20 1850  CKTOTAL 91   BNP (last 3 results) No results for input(s): PROBNP in the  last 8760 hours. HbA1C: No results for input(s): HGBA1C in the last 72 hours. CBG: No results for input(s): GLUCAP in the last 168 hours. Lipid Profile: No results for input(s): CHOL, HDL, LDLCALC, TRIG, CHOLHDL, LDLDIRECT in the last 72 hours. Thyroid Function Tests: No results for input(s): TSH, T4TOTAL, FREET4, T3FREE, THYROIDAB in the last 72 hours. Anemia Panel: Recent Labs    01/23/20 1850  FERRITIN 914*  TIBC 223*  IRON 93   Urine analysis:    Component Value Date/Time   COLORURINE YELLOW 01/23/2020 1855   APPEARANCEUR CLOUDY (A) 01/23/2020 1855   LABSPEC 1.012 01/23/2020 1855   PHURINE 8.0 01/23/2020 1855   GLUCOSEU NEGATIVE 01/23/2020 1855   HGBUR NEGATIVE 01/23/2020 1855   BILIRUBINUR NEGATIVE 01/23/2020 1855   BILIRUBINUR negative 01/17/2020 1535  KETONESUR NEGATIVE 01/23/2020 1855   PROTEINUR 30 (A) 01/23/2020 1855   UROBILINOGEN 0.2 01/17/2020 1535   NITRITE NEGATIVE 01/23/2020 1855   LEUKOCYTESUR LARGE (A) 01/23/2020 1855   Sepsis Labs: @LABRCNTIP (procalcitonin:4,lacticidven:4)  ) Recent Results (from the past 240 hour(s))  Urine Culture     Status: Abnormal   Collection Time: 01/17/20  3:14 PM   Specimen: Urine  Result Value Ref Range Status   MICRO NUMBER: [id number]  Final   SPECIMEN QUALITY: Adequate  Final   Sample Source NOT GIVEN  Final   STATUS: FINAL  Final   ISOLATE 1: Escherichia coli (A)  Final    Comment: Greater than 100,000 CFU/mL of Escherichia coli      Susceptibility   Escherichia coli - URINE CULTURE, REFLEX    AMOX/CLAVULANIC <=2 Sensitive     AMPICILLIN 8 Sensitive     AMPICILLIN/SULBACTAM <=2 Sensitive     CEFAZOLIN* <=4 Not Reportable      * For infections other than uncomplicated UTIcaused by E. coli, K. pneumoniae or P. mirabilis:Cefazolin is resistant if MIC > or = 8 mcg/mL.(Distinguishing susceptible versus intermediatefor isolates with MIC < or = 4 mcg/mL requiresadditional testing.)For uncomplicated UTI caused by E.  coli,K. pneumoniae or P. mirabilis: Cefazolin issusceptible if MIC <32 mcg/mL and predictssusceptible to the oral agents cefaclor, cefdinir,cefpodoxime, cefprozil, cefuroxime, cephalexinand loracarbef.    CEFEPIME <=1 Sensitive     CEFTRIAXONE <=1 Sensitive     CIPROFLOXACIN >=4 Resistant     LEVOFLOXACIN >=8 Resistant     ERTAPENEM <=0.5 Sensitive     GENTAMICIN <=1 Sensitive     IMIPENEM <=0.25 Sensitive     NITROFURANTOIN <=16 Sensitive     PIP/TAZO <=4 Sensitive     TOBRAMYCIN <=1 Sensitive     TRIMETH/SULFA* <=20 Sensitive      * For infections other than uncomplicated UTIcaused by E. coli, K. pneumoniae or P. mirabilis:Cefazolin is resistant if MIC > or = 8 mcg/mL.(Distinguishing susceptible versus intermediatefor isolates with MIC < or = 4 mcg/mL requiresadditional testing.)For uncomplicated UTI caused by E. coli,K. pneumoniae or P. mirabilis: Cefazolin issusceptible if MIC <32 mcg/mL and predictssusceptible to the oral agents cefaclor, cefdinir,cefpodoxime, cefprozil, cefuroxime, cephalexinand loracarbef.Legend:S = Susceptible  I = IntermediateR = Resistant  NS = Not susceptible* = Not tested  NR = Not reported**NN = See antimicrobic comments  SARS CORONAVIRUS 2 (TAT 6-24 HRS) Nasopharyngeal Nasopharyngeal Swab     Status: None   Collection Time: 01/23/20  3:06 PM   Specimen: Nasopharyngeal Swab  Result Value Ref Range Status   SARS Coronavirus 2 NEGATIVE NEGATIVE Final    Comment: (NOTE) SARS-CoV-2 target nucleic acids are NOT DETECTED. The SARS-CoV-2 RNA is generally detectable in upper and lower respiratory specimens during the acute phase of infection. Negative results do not preclude SARS-CoV-2 infection, do not rule out co-infections with other pathogens, and should not be used as the sole basis for treatment or other patient management decisions. Negative results must be combined with clinical observations, patient history, and epidemiological information. The expected  result is Negative. Fact Sheet for Patients: SugarRoll.be Fact Sheet for Healthcare Providers: https://www.woods-mathews.com/ This test is not yet approved or cleared by the [place] FDA and  has been authorized for detection and/or diagnosis of SARS-CoV-2 by FDA under an Emergency Use Authorization (EUA). This EUA will remain  in effect (meaning this test can be used) for the duration of the COVID-19 declaration under Section 56 4(b)(1) of the Act, 21 U.S.C. section 360bbb-3(b)(1),  unless the authorization is terminated or revoked sooner. Performed at [facility], [place] [address] [place], [place] [address]   Culture, Urine     Status: None   Collection Time: 01/23/20  6:54 PM   Specimen: Urine, Clean Catch  Result Value Ref Range Status   Specimen Description URINE, CLEAN CATCH  Final   Special Requests Normal  Final   Culture   Final    NO GROWTH Performed at [facility], [place] [address] [place], [place] [address]    Report Status 01/24/2020 FINAL  Final      Radiology Studies: US Renal  Result Date: 01/23/2020 CLINICAL DATA:  Right-sided flank pain for 2 weeks EXAM: RENAL / URINARY TRACT ULTRASOUND COMPLETE COMPARISON:  02/21/2016 FINDINGS: Right Kidney: Renal measurements: 10.6 x 5.0 by 5.1 cm = volume: 141 mL. Diffuse increased renal cortical echotexture and decreased corticomedullary differentiation, compatible with medical renal disease. Multifocal areas of cortical thinning and scarring. Multiple small simple renal cysts measuring up to 1.5 cm. Left Kidney: Renal measurements: 9.5 x 5.9 x 5.0 cm = volume: 148 mL. Diffuse increased renal cortical echotexture and decreased corticomedullary differentiation, compatible with medical renal disease. Bladder: Appears normal for degree of bladder distention. Other: No evidence of urinary tract calculi or obstructive uropathy. IMPRESSION: 1. Increased renal cortical echotexture  consistent with medical renal disease. 2. Chronic right renal cortical thinning and scarring, not appreciably changed since prior CT exam. 3. Small simple cysts right kidney. Electronically Signed   By: Randa Ngo M.D.   On: 01/23/2020 15:49     Scheduled Meds: . sodium chloride   Intravenous Once  . heparin  5,000 Units Subcutaneous Q12H  . hydrALAZINE  100 mg Oral TID  . levothyroxine  88 mcg Oral Q0600  . rosuvastatin  10 mg Oral QHS   Continuous Infusions: . sodium chloride 100 mL/hr at 01/25/20 0733  . cefTRIAXone (ROCEPHIN)  IV 1 g (01/24/20 2001)     LOS: 2 days   Time Spent in minutes   45 minutes  Loribeth Katich D.O. on 01/25/2020 at 9:00 AM  Between 7am to 7pm - Please see pager noted on [email or web address]  After 7pm go to [email or web address]  And look for the night coverage person covering for me after hours  [facility] Office  [phone]

## 2020-01-25 NOTE — Progress Notes (Signed)
CCMD called to report SVT. Assessed patient to find her in no distress, denied any symptoms. Assessed rhythm strip and confirmed 19 beat run of SVT. Will continue to monitor for symptoms.

## 2020-01-25 NOTE — Progress Notes (Addendum)
Patient's daughter, [name], currently at bedside and requesting to speak with MD. [name] was updated on plan of care for patient by nurse. Mikhail, MD paged.

## 2020-01-25 NOTE — Progress Notes (Cosign Needed)
APP STUDENT NOTE FOR EDUCATION PURPOSES      [facility] Progress Note    Assessment/ Plan:   Patient is an 84 year old African-American woman with PMH significant for HTN for the past ~20 years, known atherosclerotic bilateral renal artery stenosis with CKD stage III (previously under the care of Dr. Florene Glen until 2018), hypothyroidism, osteoporosis (BMD 08/2019, T score -2.8), dyslipidemia, history of gout and recurrent UTIs with E. coli.  Patient was on losartan, rosuvastatin and augmentin prior to admission.  1. Acute on Chronic Kidney Injury with possible hemodynamically mediated acute renal injury in the setting of UTI from decreased oral intake/ongoing ARB, acute interstitial nephritis from Augmentin versus urinary tract infection itself or hypercalcemia induced acute kidney injury (from malignancy).  If renal function does not improve as anticipated in the next 24 to 48 hours, renal biopsy may be warranted to evaluate for AIN before corticosteroid treatment. Patient needs renal biopsy given abnormal SPEP results and FKL ratio <0.26, Concern for amyloidosis nephropathy vs MM.    -CKD Stage III with baseline Cr (1.7-1.9)  -Recent UTIs: 11/22/2019, 12/28/2019 and 01/17/2020 (failed treatment)  -Renal US: echogenicity consistent with chronic inflammation, no stones/obstruction, right renal  cortical thinning/scarring consistent with prior CT  -Avoid nephrotoxins: iodinated IV contrast and NSAIDs. Discontinue losartan   -renal diet with fluid restriction 123mL  -IVF, if appears fluid overload can use IVF with concomitant Lasix for calcium, Ca improving   Labs:  -BUN  --> 49 today  -Cr 2.5 (1/27) --> 5.3 --> 4.77 --> 3.94 today  -Uric acid 17.1  -sed elevated at 125   -Urine protein total 386, Urine Protein Creatinine Ration 6.04      2. Anemia   -acute in nature given decline from 11 to 8.5 in 5 months without overt blood loss  -Hb 8.5 --> 6.7 --> 2 unit prbc 2/3 --> 9.6  today  -Iron Panel: Ferritin 914, Fe 93, TIBC 223  -LDH 302, concern for hemolysis in setting of anemia and high uric acid level (17.7)  3. Hypercalcemia  -concern for paraneoplastic syndrome given anemia  -SPEP/free light chains elevated indicating need for renal biopsy, concern for amyloidosis  vs MM   M spike .6   Kappa free light chains 49.9   Lambda free light chains 12252.6   Ratio K/L 0.004 (abnormal <0.26 or >1.65)    -Mammogram from 08/2019 with right breast calcifications needing further imaging/magnification  -Ca 12.6 --> 11 --> 10.8 today  -Vit D 20.67  -PTH 24 & Ca 11.4 --> Non-PTH hypercalcemia per lab result interpretation   -PTH RP pending   -if appears fluid overload can use IVF with concomitant Lasix for calcium excretion  4. HTN  -discontinue losartan given setting of AKI   -start hydralazine 100mg  TID  5. UTI, recurrent with E. coli on 11/22/2019, 12/28/2019 and 01/17/2020 with incomplete treatment of her latest infection with Augmentin (after the patient did not perceive improvement).   -urine culture without growth x 2 days  -per primary team   6. Hypothyroidism  -TSH 92 on 1/27, this can also in part explain the patient's nephrotic syndrome of 6g PCR 2/2  -Synthroid per primary team      Subjective:   On exam patient was laying in bed in no acute distress, half of breakfast was consumed on bedside table. Patient endorses feeling the same as yesterday with no acute complaints.  Patient has endorsed fatigue, increased weakness, increased cold intolerance, weight loss, decreased appetite, dysgeusia,  low back tenderness, and increased urinary frequency for the past two weeks. Patient endorses an unintentional recent weight loss of 6 pounds with decreased appetite. Patient further endorses abdominal pain upon taking a deep breath. Patient endorses using a walker at baseline and feels weaker than normal. Patient denies vision changes, fever, headache, dizziness, syncope,  falls, rashes, itching, N/V/D, hiccups, leg swelling, and other urinary symptoms (burning, pain, itching, overt hematuria), hemoptosis/melana/hematochezia. Patient reports PCP found blood in her urine, however there was no color change. Further, patient denies family history of blood disorders/cancers or bone/bone marrow cancer.     Objective:   BP (!) 174/67 (BP Location: Left Arm)   Pulse (!) 57   Temp 98.6 F (37 C)   Resp 18   Ht 5\' 3"  (1.6 m)   Wt 74.8 kg   SpO2 98%   BMI 29.21 kg/m   Intake/Output Summary (Last 24 hours) at 01/25/2020 0800 Last data filed at 01/25/2020 0413 Gross per 24 hour  Intake 1817.5 ml  Output 1050 ml  Net 767.5 ml   Weight change: 1.5 kg  Physical Exam: Gen: pleasant, laying in bed  CVS: RRR, no rubs or murmurs noted  Resp: CTAB, non labored breathing  Abd: bowel sounds presents  Ext: trace edema on RLE   Imaging: US Renal  Result Date: 01/23/2020 CLINICAL DATA:  Right-sided flank pain for 2 weeks EXAM: RENAL / URINARY TRACT ULTRASOUND COMPLETE COMPARISON:  02/21/2016 FINDINGS: Right Kidney: Renal measurements: 10.6 x 5.0 by 5.1 cm = volume: 141 mL. Diffuse increased renal cortical echotexture and decreased corticomedullary differentiation, compatible with medical renal disease. Multifocal areas of cortical thinning and scarring. Multiple small simple renal cysts measuring up to 1.5 cm. Left Kidney: Renal measurements: 9.5 x 5.9 x 5.0 cm = volume: 148 mL. Diffuse increased renal cortical echotexture and decreased corticomedullary differentiation, compatible with medical renal disease. Bladder: Appears normal for degree of bladder distention. Other: No evidence of urinary tract calculi or obstructive uropathy. IMPRESSION: 1. Increased renal cortical echotexture consistent with medical renal disease. 2. Chronic right renal cortical thinning and scarring, not appreciably changed since prior CT exam. 3. Small simple cysts right kidney. Electronically Signed    By: Randa Ngo M.D.   On: 01/23/2020 15:49    Labs: BMET Recent Labs  Lab 01/22/20 1613 01/23/20 1323 01/23/20 1724 01/24/20 0523 01/25/20 0331  NA 137 138  --  139 137  K 3.9 4.0  --  3.7 3.3*  CL 96 95*  --  102 102  CO2 27 27  --  26 24  GLUCOSE 132* 114*  --  100* 99  BUN 52* 53*  --  49* 44*  CREATININE 5.33* 5.18*  --  4.77* 3.94*  CALCIUM 12.6* 12.1* 11.4* 11.0* 10.8*  PHOS  --   --   --  5.1* 4.5   CBC Recent Labs  Lab 01/22/20 1613 01/22/20 1613 01/23/20 1323 01/24/20 0523 01/24/20 1930 01/25/20 0331  WBC 15.1*  --  14.6* 7.9  --  8.8  HGB 8.5 Repeated and verified X2.*   < > 8.1* 6.7* 9.6* 9.6*  HCT 26.0*   < > 25.3* 20.9* 29.3* 29.4*  MCV 86.1  --  88.5 88.9  --  84.2  PLT 184.0  --  164 138*  --  129*   < > = values in this interval not displayed.    Medications:    . sodium chloride   Intravenous Once  . heparin  5,000 Units Subcutaneous Q12H  . hydrALAZINE  100 mg Oral TID  . levothyroxine  88 mcg Oral Q0600  . rosuvastatin  10 mg Oral QHS     Harrell Gave, PA STUDENT 01/25/2020, 8:00 AM

## 2020-01-25 NOTE — Progress Notes (Signed)
I responded to a [place] to provide spiritual support for the patient. According to the nurse, the patient is fearful about her medical procedure tomorrow and requested a visit from the [place]. I visited the patient's room and she was sleeping soundly. I did not want to awaken her. I spoke with the nurse and shared that I would let the Unit Chaplain know about her visitation request. I prayed for Carolyn Myers outside her room door before leaving the Unit.    01/25/20 2129  Clinical Encounter Type  Visited With Patient not available  Visit Type Spiritual support  Referral From Nurse  Consult/Referral To Chaplain  Spiritual Encounters  Spiritual Needs Prayer    Chaplain Dr Redgie Grayer

## 2020-01-25 NOTE — TOC Initial Note (Addendum)
Transition of Care [facility]) - Initial/Assessment Note    Patient Details  Name: Carolyn Myers MRN: [id number] Date of Birth: [birth year]  Transition of [facility]) CM/SW Contact:    Alberteen Sam, [place] Phone Number: [phone] 01/25/2020, 10:03 AM  Clinical Narrative:                  CSW met with patient at bedside to discuss discharge plan and PT recommendation of SNF at time of discharge. Patient is in agreement with SNF, and gave CSW permission to fax referral to [facility] which is her preference. CSW informed of insurance authorization process, patient acknowledged understanding.   CSW to fax out referral for bed offer. Pending bed offer and insurance authorization at this time.   [facility] insurance auth initiated Reference # [id number].   Expected Discharge Plan: Skilled Nursing Facility Barriers to Discharge: Continued Medical Work up   Patient Goals and CMS Choice Patient states their goals for this hospitalization and ongoing recovery are:: to go to rehab before home CMS Medicare.gov Compare Post Acute Care list provided to:: Patient Choice offered to / list presented to : Patient  Expected Discharge Plan and Services Expected Discharge Plan: [place] Acute Care Choice: [place] arrangements for the past 2 months: Single Family Home                                      Prior Living Arrangements/Services Living arrangements for the past 2 months: Single Family Home Lives with:: Self Patient language and need for interpreter reviewed:: Yes Do you feel safe going back to the place where you live?: No   needs short term rehab  Need for Family Participation in Patient Care: Yes (Comment) Care giver support system in place?: Yes (comment)   Criminal Activity/Legal Involvement Pertinent to Current Situation/Hospitalization: No - Comment as needed  Activities of Daily Living Home Assistive Devices/Equipment:  Walker (specify type) ADL Screening (condition at time of admission) Patient's cognitive ability adequate to safely complete daily activities?: Yes Is the patient deaf or have difficulty hearing?: No Does the patient have difficulty seeing, even when wearing glasses/contacts?: No Does the patient have difficulty concentrating, remembering, or making decisions?: No Patient able to express need for assistance with ADLs?: Yes Does the patient have difficulty dressing or bathing?: No Independently performs ADLs?: Yes (appropriate for developmental age) Does the patient have difficulty walking or climbing stairs?: Yes Weakness of Legs: Both Weakness of Arms/Hands: None  Permission Sought/Granted Permission sought to share information with : Case Manager, Customer service manager, Family Supports Permission granted to share information with : Yes, Verbal Permission Granted  Share Information with NAME: Elssa  Permission granted to share info w AGENCY: SNFs  Permission granted to share info w Relationship: daughter  Permission granted to share info w Contact Information: [phone]  Emotional Assessment Appearance:: Appears stated age Attitude/Demeanor/Rapport: Gracious Affect (typically observed): Calm Orientation: : Oriented to Self, Oriented to Place, Oriented to  Time, Oriented to Situation Alcohol / Substance Use: Not Applicable Psych Involvement: No (comment)  Admission diagnosis:  AKI (acute kidney injury) ([place]) [N17.9] Anemia, unspecified type [D64.9] Hypothyroidism, unspecified type [E03.9] Patient Active Problem List   Diagnosis Date Noted  . AKI (acute kidney injury) ([place]) 01/23/2020  . Anemia   . Osteoporosis 09/05/2019  . Frail elderly 08/03/2019  . 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 Hypertensive urgency 08/23/2018  . Atypical chest pain 08/22/2018  . Sleep apnea   . Palpitations   . Migraine   . Hyperlipidemia   . History of gout   . History of blood  transfusion   . Frequent headaches   . Diverticulitis   . Chicken pox   . Arthritis   . CKD (chronic kidney disease) stage 3, GFR 30-59 ml/min (HCC) 02/23/2017  . Age-related osteoporosis without current pathological fracture 02/23/2017  . Esophageal reflux 05/08/2016  . Renal artery stenosis ([place]) 02/22/2016  . Hypothyroidism 02/22/2016  . Chest pain with high risk for cardiac etiology 02/21/2016  . Gout   . SOB (shortness of breath) on exertion 10/06/2015  . Accelerated hypertension 10/06/2015  . Essential hypertension, benign 11/26/2013  . Hyperlipidemia LDL goal <100 11/26/2013   PCP:  Copland, Gay Filler, MD Pharmacy:   [id number] [place] [id number]: [phone]: [phone]    Social Determinants of Health (SDOH) Interventions    Readmission Risk Interventions No flowsheet data found.

## 2020-01-25 NOTE — NC FL2 (Signed)
[place] MEDICAID FL2 LEVEL OF CARE SCREENING TOOL     IDENTIFICATION  Patient Name: Carolyn Myers Birthdate: Dec 05, 1936 Sex: female Admission Date (Current Location): 01/23/2020  [facility] and [place] Number:  Herbalist and Address:  [facility], [place] [address] [place], [place] [address]      Provider Number: [id number]  Attending Physician Name and Address:  Cristal Ford, DO  Relative Name and Phone Number:  Vonda Antigua (daughter) [phone]    Current Level of Care: Hospital Recommended Level of Care: [place] Prior Approval Number:    Date Approved/Denied:   PASRR Number: [id number]  Discharge Plan: SNF    Current Diagnoses: Patient Active Problem List   Diagnosis Date Noted  . AKI (acute kidney injury) ([place]) 01/23/2020  . Anemia   . Osteoporosis 09/05/2019  . Frail elderly 08/03/2019  . 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 Hypertensive urgency 08/23/2018  . Atypical chest pain 08/22/2018  . Sleep apnea   . Palpitations   . Migraine   . Hyperlipidemia   . History of gout   . History of blood transfusion   . Frequent headaches   . Diverticulitis   . Chicken pox   . Arthritis   . CKD (chronic kidney disease) stage 3, GFR 30-59 ml/min (HCC) 02/23/2017  . Age-related osteoporosis without current pathological fracture 02/23/2017  . Esophageal reflux 05/08/2016  . Renal artery stenosis ([place]) 02/22/2016  . Hypothyroidism 02/22/2016  . Chest pain with high risk for cardiac etiology 02/21/2016  . Gout   . SOB (shortness of breath) on exertion 10/06/2015  . Accelerated hypertension 10/06/2015  . Essential hypertension, benign 11/26/2013  . Hyperlipidemia LDL goal <100 11/26/2013    Orientation RESPIRATION BLADDER Height & Weight     Self, Time, Situation, Place  Normal Continent, External catheter Weight: 164 lb 14.5 oz (74.8 kg) Height:  5\' 3"  (160 cm)  BEHAVIORAL SYMPTOMS/MOOD NEUROLOGICAL BOWEL  NUTRITION STATUS      Continent Diet(see discharge summary)  AMBULATORY STATUS COMMUNICATION OF NEEDS Skin   Limited Assist Verbally                         Personal Care Assistance Level of Assistance  Bathing, Feeding, Dressing, Total care Bathing Assistance: Limited assistance Feeding assistance: Independent Dressing Assistance: Limited assistance Total Care Assistance: Limited assistance   Functional Limitations Info  Sight, Hearing, Speech Sight Info: Adequate Hearing Info: Adequate Speech Info: Adequate    SPECIAL CARE FACTORS FREQUENCY  PT (By licensed PT), OT (By licensed OT)     PT Frequency: min 5x weekly OT Frequency: min 5x weekly            Contractures Contractures Info: Not present    Additional Factors Info  Code Status, Allergies Code Status Info: DNR Allergies Info: Pineapple           Current Medications (01/25/2020):  This is the current hospital active medication list Current Facility-Administered Medications  Medication Dose Route Frequency Provider Last Rate Last Admin  . 0.9 %  sodium chloride infusion (Manually program via Guardrails IV Fluids)   Intravenous Once Lang Snow, FNP      . 0.9 %  sodium chloride infusion   Intravenous Continuous Lequita Halt, MD 100 mL/hr at 01/25/20 0733 New Bag at 01/25/20 0733  . acetaminophen-codeine (TYLENOL #3) 300-30 MG per tablet 0.5-1 tablet  0.5-1 tablet Oral Q8H PRN Lequita Halt, MD  1 tablet at 01/24/20 2039  . cefTRIAXone (ROCEPHIN) 1 g in sodium chloride 0.9 % 100 mL IVPB  1 g Intravenous Q24H Wynetta Fines T, MD 200 mL/hr at 01/24/20 2001 1 g at 01/24/20 2001  . diclofenac Sodium (VOLTAREN) 1 % topical gel 2 g  2 g Topical QID PRN Wynetta Fines T, MD   2 g at 01/23/20 2259  . heparin injection 5,000 Units  5,000 Units Subcutaneous Q12H Wynetta Fines T, MD   5,000 Units at 01/25/20 [id number]  . hydrALAZINE (APRESOLINE) tablet 100 mg  100 mg Oral TID Lequita Halt, MD   100 mg at 01/25/20 [id number]  .  HYDROmorphone (DILAUDID) tablet 2 mg  2 mg Oral Q4H PRN Wynetta Fines T, MD      . levothyroxine (SYNTHROID) tablet 88 mcg  88 mcg Oral Q0600 Lequita Halt, MD   88 mcg at 01/25/20 0559  . rosuvastatin (CRESTOR) tablet 10 mg  10 mg Oral QHS Lequita Halt, MD   10 mg at 01/24/20 2131     Discharge Medications: Please see discharge summary for a list of discharge medications.  Relevant Imaging Results:  Relevant Lab Results:   Additional Information SSN: [id number]  Alberteen Sam, LCSW

## 2020-01-25 NOTE — Consult Note (Signed)
Chief Complaint: Patient was seen in consultation today for image guided random renal biopsy  Chief Complaint  Patient presents with  . Acute Renal Failure    Referring Physician(s): Madelon Lips   Supervising Physician: Pilar Jarvis  Patient Status: [facility] - In-pt  History of Present Illness: Carolyn Myers is an  84 y.o. female who presented to the ER 2 days ago for lab work and not feeling well. Patient recently was treated for UTI by her PCP with augmentin. Creatinine had increased significantly in a couple of days. Patient was also complaining of flank pain, decreased appetite and decreased oral intake. Her WBC was elevated (14) and she was afebrile. Ultrasound in the ED showed "increaased renal cortical echotexture consistent with medical renal disease, chronic right renal cortical thinning and scarring and small simple cysts on right kidney". Patient was admitted to the hospital and was given IVFs and nephrotoxic agents have been held.   IR has been consulted by nephrology for a random renal biopsy with a concern for amyloidosis nephropathy vs MM. Today, patient is alert and appears comfortable in bed. She states she feels "I can't make it" through the renal biopsy. Procedure was explained to her, including risks and benefits, and patient agreed. Patient denies any nausea, vomiting, abdominal pain, headaches, SOB or chest pain. She reports back pain. Today her creatinine is improving (3.94). Platelets are trending downward (129). A DG Bone Survey Met showed "Multiple small rounded lucencies are noted in the visualized skull which may represent venous lakes, but lytic lesions related to multiple myeloma metastatic disease cannot be excluded. Also noted is lytic destructive lesion seen involving the proximal right fibular shaft consistent with metastatic disease or malignancy."  Past Medical History:  Diagnosis Date  . Arthritis    "minor in my shoulders" (02/21/2016)  . Chicken  pox   . Diverticulitis   . Frequent headaches    "maybe twice/week" (03/12/2016)  . Gout    Right Foot  . History of blood transfusion    "said my blood was low"  . History of gout   . Hyperlipidemia   . Hypertension   . Hypothyroidism   . Migraine    "maybe twice/year" (02/21/2016)  . Palpitations    "doctor thought it was related to my thyroid"  . Sleep apnea    "I don't wear my mask" (02/21/2016)    Past Surgical History:  Procedure Laterality Date  . ABDOMINAL HYSTERECTOMY     "for fibroid tumors"  . TUBAL LIGATION      Allergies: Pineapple  Medications: Prior to Admission medications   Medication Sig Start Date End Date Taking? Authorizing Provider  acetaminophen-codeine (TYLENOL #3) 300-30 MG tablet Take 0.5-1 tablets by mouth every 8 (eight) hours as needed for moderate pain. 12/28/19  Yes Copland, Gay Filler, MD  aspirin EC 81 MG EC tablet Take 1 tablet (81 mg total) by mouth daily. 02/22/16  Yes Brett Canales, PA-C  Carboxymethylcellulose Sodium ([employer] LUBRICANT EYE DROPS PF OP) Place 1-2 drops into both eyes 3 (three) times daily as needed (for dry eyes).   Yes [provider]  diclofenac Sodium (VOLTAREN) 1 % GEL Apply 2 g topically 4 (four) times daily. Use as needed for painful joints.  Max 32 grams per day Patient taking differently: Apply 2 g topically 4 (four) times daily as needed (to affected sites/painful joints (right leg/hip) ("MAX 32 GRAMS/DAY")).  11/22/19  Yes Copland, Gay Filler, MD  hydrALAZINE (APRESOLINE) 100 MG tablet  TAKE 1 TABLET BY MOUTH 3 TIMES DAILY. MONITOR YOUR BLOOD PRESSURE, HOLD FOR BP 100/55 AND PULSE 55 Patient taking differently: Take 100 mg by mouth See admin instructions. Take 100 mg by mouth three times a day and MONITOR YOUR BLOOD PRESSURE- HOLD FOR B/P 100/55 AND PULSE 55 11/06/19  Yes Copland, Gay Filler, MD  levothyroxine (SYNTHROID) 88 MCG tablet TAKE 1 TABLET BY MOUTH DAILY BEFORE BREAKFAST. Patient taking differently: Take 88 mcg  by mouth daily before breakfast.  01/17/20  Yes Copland, Gay Filler, MD  rosuvastatin (CRESTOR) 10 MG tablet TAKE 1 TABLET BY MOUTH EVERY DAY Patient taking differently: Take 10 mg by mouth at bedtime.  11/06/19  Yes Copland, Gay Filler, MD  amLODipine (NORVASC) 10 MG tablet Take 1 tablet (10 mg total) by mouth daily. Patient not taking: Reported on 01/23/2020 05/04/18   Copland, Gay Filler, MD  losartan (COZAAR) 50 MG tablet Take 1 tablet (50 mg total) by mouth daily. Patient not taking: Reported on 01/23/2020 05/04/18   Copland, Gay Filler, MD  meclizine (ANTIVERT) 12.5 MG tablet Take 1 tablet (12.5 mg total) by mouth 3 (three) times daily as needed for dizziness. Patient not taking: Reported on 01/23/2020 08/03/19   Copland, Gay Filler, MD  nitroGLYCERIN (NITROSTAT) 0.4 MG SL tablet Place 1 tablet (0.4 mg total) under the tongue every 5 (five) minutes x 3 doses as needed for chest pain. 11/05/16   Belva Crome, MD  ondansetron (ZOFRAN) 4 MG tablet Take 1 tablet (4 mg total) by mouth every 8 (eight) hours as needed for nausea or vomiting. Patient not taking: Reported on 01/23/2020 01/17/20   Copland, Gay Filler, MD  ranitidine (ZANTAC) 300 MG tablet Take 0.5 tablets (150 mg total) by mouth 2 (two) times daily. Patient not taking: Reported on 01/23/2020 05/04/18   Copland, Gay Filler, MD     Family History  Problem Relation Age of Onset  . Heart disease Mother   . Heart attack Mother        died from it  . Hypertension Mother   . Arthritis Mother   . Stroke Father   . Cancer Maternal Grandmother   . Heart attack Maternal Aunt   . Renal Disease Maternal Aunt   . Multiple myeloma Maternal Uncle   . Emphysema Maternal Uncle   . Heart disease Sister   . Thyroid disease Sister   . Healthy Son        x3  . Healthy Daughter        x5    Social History   Socioeconomic History  . Marital status: Widowed    Spouse name: Not on file  . Number of children: Not on file  . Years of education: Not on file    . Highest education level: Not on file  Occupational History  . Occupation: Retired  Tobacco Use  . Smoking status: Never Smoker  . Smokeless tobacco: Never Used  Substance and Sexual Activity  . Alcohol use: No  . Drug use: No  . Sexual activity: Never  Other Topics Concern  . Not on file  Social History Narrative   Lives alone, family helps in her care.   Social Determinants of Health   Financial Resource Strain:   . Difficulty of Paying Living Expenses: Not on file  Food Insecurity:   . Worried About Charity fundraiser in the Last Year: Not on file  . Ran Out of Food in the Last Year: Not on file  Transportation Needs:   . Film/video editor (Medical): Not on file  . Lack of Transportation (Non-Medical): Not on file  Physical Activity:   . Days of Exercise per Week: Not on file  . Minutes of Exercise per Session: Not on file  Stress:   . Feeling of Stress : Not on file  Social Connections:   . Frequency of Communication with Friends and Family: Not on file  . Frequency of Social Gatherings with Friends and Family: Not on file  . Attends Religious Services: Not on file  . Active Member of Clubs or Organizations: Not on file  . Attends Archivist Meetings: Not on file  . Marital Status: Not on file     Review of Systems  Constitutional: Positive for chills ("felt cold"). Negative for fever.  Respiratory: Negative for cough, chest tightness and shortness of breath.   Cardiovascular: Negative for chest pain and leg swelling.  Gastrointestinal: Negative for abdominal pain, nausea and vomiting.  Musculoskeletal: Positive for back pain.    Vital Signs: BP (!) 160/58 (BP Location: Right Arm)   Pulse 64   Temp 98.5 F (36.9 C) (Oral)   Resp 15   Ht '5\' 3"'$  (1.6 m)   Wt 164 lb 14.5 oz (74.8 kg)   SpO2 97%   BMI 29.21 kg/m   Physical Exam Cardiovascular:     Rate and Rhythm: Normal rate and regular rhythm.     Heart sounds: Normal heart sounds.   Pulmonary:     Effort: Pulmonary effort is normal.     Breath sounds: Normal breath sounds.  Abdominal:     General: Abdomen is flat. Bowel sounds are normal.     Palpations: Abdomen is soft.  Skin:    General: Skin is warm and dry.  Neurological:     Mental Status: She is alert and oriented to person, place, and time.     Imaging: DG Chest 2 View  Result Date: 01/17/2020 CLINICAL DATA:  84 year old female with weakness and malaise for 1 week. Back pain and urinary symptoms. EXAM: CHEST - 2 VIEW COMPARISON:  08/22/2018 chest radiographs and earlier. FINDINGS: AP and lateral views. Lung volumes and mediastinal contours are stable and within normal limits. Visualized tracheal air column is within normal limits. Both lungs appear clear. No pneumothorax or pleural effusion. No acute osseous abnormality identified. Negative visible bowel gas pattern. IMPRESSION: No acute cardiopulmonary abnormality. Electronically Signed   By: Genevie Ann M.D.   On: 01/17/2020 16:07   DG Lumbar Spine Complete  Result Date: 12/28/2019 CLINICAL DATA:  Low back pain EXAM: LUMBAR SPINE - COMPLETE 4+ VIEW COMPARISON:  CT 02/21/2016 FINDINGS: Minimal scoliosis of the spine. Sagittal alignment is within normal limits. Vertebral body heights are maintained. Mild degenerative changes at L4-L5 and L5-S1. Aortic atherosclerosis. IMPRESSION: Mild degenerative changes.  No acute osseous abnormality. Electronically Signed   By: Donavan Foil M.D.   On: 12/28/2019 22:12   US Renal  Result Date: 01/23/2020 CLINICAL DATA:  Right-sided flank pain for 2 weeks EXAM: RENAL / URINARY TRACT ULTRASOUND COMPLETE COMPARISON:  02/21/2016 FINDINGS: Right Kidney: Renal measurements: 10.6 x 5.0 by 5.1 cm = volume: 141 mL. Diffuse increased renal cortical echotexture and decreased corticomedullary differentiation, compatible with medical renal disease. Multifocal areas of cortical thinning and scarring. Multiple small simple renal cysts measuring  up to 1.5 cm. Left Kidney: Renal measurements: 9.5 x 5.9 x 5.0 cm = volume: 148 mL. Diffuse increased renal cortical echotexture and  decreased corticomedullary differentiation, compatible with medical renal disease. Bladder: Appears normal for degree of bladder distention. Other: No evidence of urinary tract calculi or obstructive uropathy. IMPRESSION: 1. Increased renal cortical echotexture consistent with medical renal disease. 2. Chronic right renal cortical thinning and scarring, not appreciably changed since prior CT exam. 3. Small simple cysts right kidney. Electronically Signed   By: Randa Ngo M.D.   On: 01/23/2020 15:49   DG Bone Survey Met  Result Date: 01/25/2020 CLINICAL DATA:  Low back pain.  Chronic kidney disease stage 3. EXAM: METASTATIC BONE SURVEY COMPARISON:  None. FINDINGS: Multiple small rounded lucencies are noted in the visualized skull which may represent venous lakes, but lytic lesions cannot be excluded. There is noted a lytic destructive lesion seen involving the proximal right fibular shaft concerning for metastatic disease or malignancy. No other focal abnormality is noted. IMPRESSION: Multiple small rounded lucencies are noted in the visualized skull which may represent venous lakes, but lytic lesions related to multiple myeloma metastatic disease cannot be excluded. Also noted is lytic destructive lesion seen involving the proximal right fibular shaft consistent with metastatic disease or malignancy. Electronically Signed   By: Marijo Conception M.D.   On: 01/25/2020 11:27   DG HIP UNILAT W OR W/O PELVIS 2-3 VIEWS RIGHT  Result Date: 01/22/2020 CLINICAL DATA:  Right hip pain, no history of injury, pain for 3 weeks EXAM: DG HIP (WITH OR WITHOUT PELVIS) 2-3V RIGHT COMPARISON:  None. FINDINGS: Frontal view of the pelvis as well as frontal and frogleg lateral views of the right hip are obtained. No acute displaced fracture. Joint spaces are well preserved. There is prominent  spondylosis within the lower lumbar spine greatest at L4-5 and L5-S1. Sacroiliac joints appear normal. IMPRESSION: 1. Unremarkable pelvis and right hip. 2. Lower lumbar spondylosis. Electronically Signed   By: Randa Ngo M.D.   On: 01/22/2020 16:37    Labs:  CBC: Recent Labs    01/22/20 1613 01/22/20 1613 01/23/20 1323 01/24/20 0523 01/24/20 1930 01/25/20 0331  WBC 15.1*  --  14.6* 7.9  --  8.8  HGB 8.5 Repeated and verified X2.*   < > 8.1* 6.7* 9.6* 9.6*  HCT 26.0*   < > 25.3* 20.9* 29.3* 29.4*  PLT 184.0  --  164 138*  --  129*   < > = values in this interval not displayed.    COAGS: No results for input(s): INR, APTT in the last 8760 hours.  BMP: Recent Labs    01/22/20 1613 01/22/20 1613 01/23/20 1323 01/23/20 1724 01/24/20 0523 01/25/20 0331  NA 137  --  138  --  139 137  K 3.9  --  4.0  --  3.7 3.3*  CL 96  --  95*  --  102 102  CO2 27  --  27  --  26 24  GLUCOSE 132*  --  114*  --  100* 99  BUN 52*  --  53*  --  49* 44*  CALCIUM 12.6*   < > 12.1* 11.4* 11.0* 10.8*  CREATININE 5.33*  --  5.18*  --  4.77* 3.94*  GFRNONAA  --   --  7*  --  8* 10*  GFRAA  --   --  8*  --  9* 12*   < > = values in this interval not displayed.    LIVER FUNCTION TESTS: Recent Labs    08/03/19 0956 01/17/20 1533 01/24/20 0523 01/25/20 0331  BILITOT 0.3  0.6  --   --   AST 12 24  --   --   ALT 6 6  --   --   ALKPHOS 73 80  --   --   PROT 7.2 7.4  --   --   ALBUMIN 4.3 4.3 2.9* 2.9*    TUMOR MARKERS: No results for input(s): AFPTM, CEA, CA199, CHROMGRNA in the last 8760 hours.  Assessment and Plan:  AKI on CKD Stage III, hypercalcemia, HTN, anemia with concern for MM; renal US reveals: 1. Increased renal cortical echotexture consistent with medical renal disease 2. Chronic right renal cortical thinning and scarring, not appreciably changed since prior CT exam.3. Small simple cysts right kidney.  Request now received for image guided random renal bx.   Anticoagulation will be held, beginning night before the procedure. Procedure scheduled tent for 2/5. Procedure was discussed with patient/daughter, as well as risks including infection, bleeding and injury to adjacent structures. Patient consented. Patient will be followed closely after the procedure.   Thank you for this interesting consult.  I greatly enjoyed meeting Carolyn Myers and look forward to participating in their care.  A copy of this report was sent to the requesting provider on this date.  Electronically Signed: D. Rowe Robert, PA-C / Donnamarie Rossetti, PA-S 01/25/2020, 3:14 PM   I spent a total of 30 minutes in face to face in clinical consultation, greater than 50% of which was counseling/coordinating care for random renal biopsy

## 2020-01-25 NOTE — Progress Notes (Signed)
Patient's daughter, Suanne Marker, called nurse requesting an update. Suanne Marker was update on plan of care with permission by patient. All questions and concerns answered.

## 2020-01-25 NOTE — Progress Notes (Signed)
MEWs score 3 due to CCMD capture of 19 beat run SVT. As per previous note, patient is asymptomatic and back in sinus rhythm. Provider notified as a precaution.

## 2020-01-25 NOTE — Progress Notes (Signed)
[facility] Progress Note    Assessment/ Plan:    1.  Acute kidney injury on chronic kidney disease stage III: Baseline Cr appears to be 1.7-1.9.  The available history, timeline of events and database points to acute kidney injury with differentials including hemodynamically mediated acute renal injury in the setting of UTI from decreased oral intake/ongoing ARB, acute interstitial nephritis from Augmentin versus urinary tract infection itself and hypercalcemia induced acute kidney injury.  Renal ultrasound does not show any obstructive pathology.  UA with only 30 mg proteinuria but UP/C is 6 g. Combined with anemia and hypercalcemia I'm worried she may have MM.  SPEP with 0.6 M-spike, kappa 49.9, lambda 12225.  Will need a biopsy.  Have discussed with pt, in agreement, US biopsy ordered, NPO past MN, biopsy form filled out and on the front of the pt's chart.    2.  Hypercalcemia: Index of suspicion that this indeed may be paraneoplastic given concomitant anemia.  PTH 24, Vit D 20.  Phos 5.1.  Suspect MM, skeletal survey ordered, would consider a heme c/s too for possible BM bx pending results.  PTHrP are pending.  Improving with IVFs.  Stop IVFs today.  If Ca up again, may need calcitonin.     3.  Hypertension: Discontinue losartan and resume hydralazine 100 mg 3 times daily at this time given acute kidney injury and monitor with volume expansion to decide on additional antihypertensive therapy.  4.  Anemia: Appears to be rather acute with hemoglobin having declined from 11.3 five months ago now to 8.5 without any reported overt blood loss.  This again raises concern for neoplasm and will await SPEP/free light chains along with iron studies.  5.  Recurrent urinary tract infection: Recommend repeat urine culture to decide on need/choice of antibiotic given incomplete treatment per patient.  6.  Hypothyroidism: Per primary service-- TSH on 1/27 was very high- 92 which can also  contribute to proteinuria and hypercalcemia--> think MM more likely  7.  Gout: Uric acid 17.1, Phos 5.1, Tbili normal 1/27, LDH 302  Subjective:    Seen in room. Ca getting better with IVFs.  To have pRBCs today for anemia.  Cr coming down, UP/C resulted at 6 g.     Objective:   BP (!) 160/58 (BP Location: Right Arm)   Pulse 64   Temp 98.5 F (36.9 C) (Oral)   Resp 15   Ht _0  (1.6 m)   Wt 74.8 kg   SpO2 97%   BMI 29.21 kg/m   Intake/Output Summary (Last 24 hours) at 01/25/2020 1352 Last data filed at 01/25/2020 0900 Gross per 24 hour  Intake 1585 ml  Output 1050 ml  Net 535 ml   Weight change: 1.5 kg  Physical Exam: Gen: sitting in bed, sleeping CVS: RRR no m/r/g Resp: clear bilaterally Abd: soft, nontender NABS Ext: no LE edema  Imaging: US Renal  Result Date: 01/23/2020 CLINICAL DATA:  Right-sided flank pain for 2 weeks EXAM: RENAL / URINARY TRACT ULTRASOUND COMPLETE COMPARISON:  02/21/2016 FINDINGS: Right Kidney: Renal measurements: 10.6 x 5.0 by 5.1 cm = volume: 141 mL. Diffuse increased renal cortical echotexture and decreased corticomedullary differentiation, compatible with medical renal disease. Multifocal areas of cortical thinning and scarring. Multiple small simple renal cysts measuring up to 1.5 cm. Left Kidney: Renal measurements: 9.5 x 5.9 x 5.0 cm = volume: 148 mL. Diffuse increased renal cortical echotexture and decreased corticomedullary differentiation, compatible with medical renal disease. Bladder: Appears  normal for degree of bladder distention. Other: No evidence of urinary tract calculi or obstructive uropathy. IMPRESSION: 1. Increased renal cortical echotexture consistent with medical renal disease. 2. Chronic right renal cortical thinning and scarring, not appreciably changed since prior CT exam. 3. Small simple cysts right kidney. Electronically Signed   By: Randa Ngo M.D.   On: 01/23/2020 15:49   DG Bone Survey Met  Result Date:  01/25/2020 CLINICAL DATA:  Low back pain.  Chronic kidney disease stage 3. EXAM: METASTATIC BONE SURVEY COMPARISON:  None. FINDINGS: Multiple small rounded lucencies are noted in the visualized skull which may represent venous lakes, but lytic lesions cannot be excluded. There is noted a lytic destructive lesion seen involving the proximal right fibular shaft concerning for metastatic disease or malignancy. No other focal abnormality is noted. IMPRESSION: Multiple small rounded lucencies are noted in the visualized skull which may represent venous lakes, but lytic lesions related to multiple myeloma metastatic disease cannot be excluded. Also noted is lytic destructive lesion seen involving the proximal right fibular shaft consistent with metastatic disease or malignancy. Electronically Signed   By: Marijo Conception M.D.   On: 01/25/2020 11:27    Labs: BMET Recent Labs  Lab 01/22/20 1613 01/23/20 1323 01/23/20 1724 01/24/20 0523 01/25/20 0331  NA 137 138  --  139 137  K 3.9 4.0  --  3.7 3.3*  CL 96 95*  --  102 102  CO2 27 27  --  26 24  GLUCOSE 132* 114*  --  100* 99  BUN 52* 53*  --  49* 44*  CREATININE 5.33* 5.18*  --  4.77* 3.94*  CALCIUM 12.6* 12.1* 11.4* 11.0* 10.8*  PHOS  --   --   --  5.1* 4.5   CBC Recent Labs  Lab 01/22/20 1613 01/22/20 1613 01/23/20 1323 01/24/20 0523 01/24/20 1930 01/25/20 0331  WBC 15.1*  --  14.6* 7.9  --  8.8  HGB 8.5 Repeated and verified X2.*   < > 8.1* 6.7* 9.6* 9.6*  HCT 26.0*   < > 25.3* 20.9* 29.3* 29.4*  MCV 86.1  --  88.5 88.9  --  84.2  PLT 184.0  --  164 138*  --  129*   < > = values in this interval not displayed.    Medications:    . sodium chloride   Intravenous Once  . heparin  5,000 Units Subcutaneous Q12H  . hydrALAZINE  100 mg Oral TID  . levothyroxine  88 mcg Oral Q0600  . rosuvastatin  10 mg Oral QHS     Madelon Lips MD 01/25/2020, 1:52 PM

## 2020-01-26 ENCOUNTER — Inpatient Hospital Stay (HOSPITAL_COMMUNITY): Payer: Medicare Other

## 2020-01-26 ENCOUNTER — Encounter (HOSPITAL_COMMUNITY): Payer: Self-pay | Admitting: Internal Medicine

## 2020-01-26 DIAGNOSIS — C9000 Multiple myeloma not having achieved remission: Principal | ICD-10-CM

## 2020-01-26 LAB — BASIC METABOLIC PANEL
Anion gap: 10 (ref 5–15)
BUN: 35 mg/dL — ABNORMAL HIGH (ref 8–23)
CO2: 21 mmol/L — ABNORMAL LOW (ref 22–32)
Calcium: 11.1 mg/dL — ABNORMAL HIGH (ref 8.9–10.3)
Chloride: 108 mmol/L (ref 98–111)
Creatinine, Ser: 3.35 mg/dL — ABNORMAL HIGH (ref 0.44–1.00)
GFR calc Af Amer: 14 mL/min — ABNORMAL LOW (ref >60)
GFR calc non Af Amer: 12 mL/min — ABNORMAL LOW (ref >60)
Glucose, Bld: 95 mg/dL (ref 70–99)
Potassium: 4 mmol/L (ref 3.5–5.1)
Sodium: 139 mmol/L (ref 135–145)

## 2020-01-26 LAB — CBC
HCT: 30.4 % — ABNORMAL LOW (ref 36.0–46.0)
Hemoglobin: 9.7 g/dL — ABNORMAL LOW (ref 12.0–15.0)
MCH: 26.8 pg (ref 26.0–34.0)
MCHC: 31.9 g/dL (ref 30.0–36.0)
MCV: 84 fL (ref 80.0–100.0)
Platelets: 123 10*3/uL — ABNORMAL LOW (ref 150–400)
RBC: 3.62 MIL/uL — ABNORMAL LOW (ref 3.87–5.11)
RDW: 16.4 % — ABNORMAL HIGH (ref 11.5–15.5)
WBC: 8.7 10*3/uL (ref 4.0–10.5)
nRBC: 2.1 % — ABNORMAL HIGH (ref 0.0–0.2)

## 2020-01-26 LAB — PROTIME-INR
INR: 1 (ref 0.8–1.2)
Prothrombin Time: 12.6 seconds (ref 11.4–15.2)

## 2020-01-26 MED ORDER — FENTANYL CITRATE (PF) 100 MCG/2ML IJ SOLN
INTRAMUSCULAR | Status: AC | PRN
  Administered 2020-01-26: 50 ug via INTRAVENOUS

## 2020-01-26 MED ORDER — CALCITONIN (SALMON) 200 UNIT/ML IJ SOLN
4.0000 [IU]/kg | Freq: Two times a day (BID) | INTRAMUSCULAR | Status: AC
  Administered 2020-01-26 (×2): 298 [IU] via SUBCUTANEOUS
  Filled 2020-01-26 (×2): qty 1.49

## 2020-01-26 MED ORDER — MORPHINE SULFATE (PF) 2 MG/ML IV SOLN
1.0000 mg | Freq: Once | INTRAVENOUS | Status: AC
  Administered 2020-01-26: 1 mg via INTRAVENOUS
  Filled 2020-01-26: qty 1

## 2020-01-26 MED ORDER — GELATIN ABSORBABLE 12-7 MM EX MISC
CUTANEOUS | Status: AC
  Filled 2020-01-26: qty 1

## 2020-01-26 MED ORDER — LIDOCAINE HCL (PF) 1 % IJ SOLN
INTRAMUSCULAR | Status: AC
  Filled 2020-01-26: qty 30

## 2020-01-26 MED ORDER — FENTANYL CITRATE (PF) 100 MCG/2ML IJ SOLN
INTRAMUSCULAR | Status: AC
  Filled 2020-01-26: qty 2

## 2020-01-26 MED ORDER — MIDAZOLAM HCL 2 MG/2ML IJ SOLN
INTRAMUSCULAR | Status: AC | PRN
  Administered 2020-01-26: 1 mg via INTRAVENOUS

## 2020-01-26 MED ORDER — SODIUM CHLORIDE 0.9 % IV SOLN: INTRAVENOUS | Status: DC

## 2020-01-26 MED ORDER — MIDAZOLAM HCL 2 MG/2ML IJ SOLN
INTRAMUSCULAR | Status: AC
  Filled 2020-01-26: qty 2

## 2020-01-26 NOTE — TOC Progression Note (Signed)
Transition of Care [facility]) - Progression Note    Patient Details  Name: Carolyn Myers MRN: [id number] Date of Birth: June 26, 1936  [facility] of [facility]) CM/SW [place], [place] Phone Number: 01/26/2020, 12:23 PM  Clinical Narrative:     Received insurance authorization: [employer] [phone] - [place]  [id number] , 01/25/20-01/29/20(next review date is 01/29/20).  Patient will need a new covid when medically ready for discharge.   Thurmond Butts, MSW, [place] Clinical Social Worker   Expected Discharge Plan: Skilled Nursing Facility Barriers to Discharge: Continued Medical Work up  Expected Discharge Plan and Services Expected Discharge Plan: [place]: [place] arrangements for the past 2 months: Single Family Home                                       Social Determinants of Health (SDOH) Interventions    Readmission Risk Interventions No flowsheet data found.

## 2020-01-26 NOTE — Procedures (Signed)
Interventional Radiology Procedure Note  Procedure: Random renal biopsy, LEFT  Complications: None immediate  Estimated Blood Loss: None  Recommendations: - Bedrest x 6 hrs  Signed,  Criselda Peaches, MD

## 2020-01-26 NOTE — Progress Notes (Signed)
I completed a follow-up visit with the patient to provide spiritual support. I shared words of encouragement and led in prayer. I shared that the Unit Chaplain may follow up with her at a later time.    01/26/20 0759  Clinical Encounter Type  Visited With Patient  Visit Type Spiritual support  Referral From Nurse  Consult/Referral To Chaplain  Spiritual Encounters  Spiritual Needs Prayer    Chaplain Dr Redgie Grayer

## 2020-01-26 NOTE — Progress Notes (Cosign Needed)
APP STUDENT NOTE FOR EDUCATION PURPOSES      Carolyn Myers Progress Note    Assessment/ Plan:   Patient is an 84 year old African-American woman with PMH significant for HTN for the past ~20 years, known atherosclerotic bilateral renal artery stenosis with CKD stage III (previously under the care of Dr. Florene Glen until 2018), hypothyroidism, osteoporosis (BMD 08/2019, T score -2.8), dyslipidemia, history of gout and recurrent UTIs with E. coli.  Patient was on losartan, rosuvastatin and augmentin prior to admission.  1. Acute on Chronic Kidney Injury with possible hemodynamically mediated acute renal injury in the setting of UTI from decreased oral intake/ongoing ARB, acute interstitial nephritis from Augmentin versus urinary tract infection itself or hypercalcemia induced acute kidney injury (from malignancy).  If renal function does not improve as anticipated in the next 24 to 48 hours, renal biopsy may be warranted to evaluate for AIN before corticosteroid treatment. Patient for renal biopsy today 2/5 given abnormal SPEP results and FKL ratio <0.26, Concern for amyloidosis nephropathy vs MM.    -CKD Stage III with baseline Cr (1.7-1.9)  -Recent UTIs: 11/22/2019, 12/28/2019 and 01/17/2020 (failed treatment)  -Renal US: echogenicity consistent with chronic inflammation, no stones/obstruction, right renal  cortical thinning/scarring consistent with prior CT  -Avoid nephrotoxins: iodinated IV contrast and NSAIDs. Discontinue losartan   -renal diet with fluid restriction 1239m  -IVF, if appears fluid overload can use IVF with concomitant Lasix for calcium  Labs:  -BUN  --> 49 --> 44 --> 35 today  -Cr 2.5 (1/27) --> 5.3 --> 4.77 --> 3.94 --> 3.35 today  -Uric acid 17.1  -sed elevated at 125   -Urine protein total 386, Urine Protein Creatinine Ration 6.04      2. Anemia   -acute in nature given decline from 11 to 8.5 in 5 months without overt blood loss  -Hb 8.5 --> 6.7 --> 2 unit  prbc 2/3 --> 9.6 --> 9.7 today  -Iron Panel: Ferritin 914, Fe 93, TIBC 223  -LDH 302, concern for hemolysis in setting of anemia and high uric acid level (17.7)  3. Hypercalcemia  -concern for paraneoplastic syndrome given anemia  -SPEP/free light chains elevated indicating need for renal biopsy, concern for amyloidosis  vs MM   M spike .6   Kappa free light chains 49.9   Lambda free light chains 12252.6   Ratio K/L 0.004 (abnormal <0.26 or >1.65)    -Mammogram from 08/2019 with right breast calcifications needing further imaging/magnification  -Ca 12.6 --> 11 --> 10.8 --> 11.1 today  -Vit D 20.67  -PTH 24 & Ca 11.4 --> Non-PTH hypercalcemia per lab result interpretation   -PTH RP pending   -if appears fluid overload can use IVF with concomitant Lasix for calcium excretion  4. HTN  -discontinue losartan given setting of AKI   -start hydralazine '100mg'$  TID  5. UTI, recurrent with E. coli on 11/22/2019, 12/28/2019 and 01/17/2020 with incomplete treatment of her latest infection with Augmentin (after the patient did not perceive improvement).   -urine culture without growth x 2 days  -per primary team   6. Hypothyroidism  -TSH 92 on 1/27, this can also in part explain the patient's nephrotic syndrome of 6g PCR 2/2  -Synthroid per primary team      Subjective:   On exam patient is watching TV, a little teary today after yesterday's discussion on need for biopsy for MM vs amyloidosis. Patient to go for renal biopsy today. Patient has no complaints today except her back  pain. Patient is awaiting to eat after biopsy      Of note, Patient has endorsed fatigue, increased weakness, increased cold intolerance, weight loss, decreased appetite, dysgeusia, low back tenderness, and increased urinary frequency for the past two weeks. Patient endorses an unintentional recent weight loss of 6 pounds with decreased appetite. Patient further endorses abdominal pain upon taking a deep breath. Patient  endorses using a walker at baseline and feels weaker than normal. Patient denies vision changes, fever, headache, dizziness, syncope, falls, rashes, itching, N/V/D, hiccups, leg swelling, and other urinary symptoms (burning, pain, itching, overt hematuria), hemoptosis/melana/hematochezia. Patient reports PCP found blood in her urine, however there was no color change. Further, patient denies family history of blood disorders/cancers or bone/bone marrow cancer.     Objective:   BP (!) 163/71 (BP Location: Right Arm)   Pulse 60   Temp 98.4 F (36.9 C) (Oral)   Resp 18   Ht '5\' 3"'$  (1.6 m)   Wt 74.6 kg   SpO2 97%   BMI 29.13 kg/m   Intake/Output Summary (Last 24 hours) at 01/26/2020 0802 Last data filed at 01/26/2020 0300 Gross per 24 hour  Intake 460 ml  Output 1300 ml  Net -840 ml   Weight change: -0.2 kg  Physical Exam: Gen: teary and pleasnt, watching TV in bed  CVS: RRR, no rubs or murmurs noted  Resp: CTAB, non labored breathing  Abd: bowel sounds presents  Ext: skin warm and dry, no rashes or edema noted  Imaging: DG Bone Survey Met  Result Date: 01/25/2020 CLINICAL DATA:  Low back pain.  Chronic kidney disease stage 3. EXAM: METASTATIC BONE SURVEY COMPARISON:  None. FINDINGS: Multiple small rounded lucencies are noted in the visualized skull which may represent venous lakes, but lytic lesions cannot be excluded. There is noted a lytic destructive lesion seen involving the proximal right fibular shaft concerning for metastatic disease or malignancy. No other focal abnormality is noted. IMPRESSION: Multiple small rounded lucencies are noted in the visualized skull which may represent venous lakes, but lytic lesions related to multiple myeloma metastatic disease cannot be excluded. Also noted is lytic destructive lesion seen involving the proximal right fibular shaft consistent with metastatic disease or malignancy. Electronically Signed   By: Marijo Conception M.D.   On: 01/25/2020 11:27     Labs: BMET Recent Labs  Lab 01/22/20 1613 01/23/20 1323 01/23/20 1724 01/24/20 0523 01/25/20 0331 01/26/20 0640  NA 137 138  --  139 137 139  K 3.9 4.0  --  3.7 3.3* 4.0  CL 96 95*  --  102 102 108  CO2 27 27  --  26 24 21*  GLUCOSE 132* 114*  --  100* 99 95  BUN 52* 53*  --  49* 44* 35*  CREATININE 5.33* 5.18*  --  4.77* 3.94* 3.35*  CALCIUM 12.6* 12.1* 11.4* 11.0* 10.8* 11.1*  PHOS  --   --   --  5.1* 4.5  --    CBC Recent Labs  Lab 01/23/20 1323 01/23/20 1323 01/24/20 0523 01/24/20 1930 01/25/20 0331 01/26/20 0640  WBC 14.6*  --  7.9  --  8.8 8.7  HGB 8.1*   < > 6.7* 9.6* 9.6* 9.7*  HCT 25.3*   < > 20.9* 29.3* 29.4* 30.4*  MCV 88.5  --  88.9  --  84.2 84.0  PLT 164  --  138*  --  129* 123*   < > = values in this interval not  displayed.    Medications:    . sodium chloride   Intravenous Once  . [START ON 01/27/2020] heparin  5,000 Units Subcutaneous Q12H  . hydrALAZINE  100 mg Oral TID  . levothyroxine  88 mcg Oral Q0600  . rosuvastatin  10 mg Oral QHS     Harrell Gave, PA STUDENT 01/26/2020, 8:02 AM

## 2020-01-26 NOTE — Progress Notes (Addendum)
PROGRESS NOTE    Carolyn Myers  UGQ:[id number] DOB: 08/14/1936 DOA: 01/23/2020 PCP: Darreld Mclean, MD   Brief Narrative:  HPI on 01/23/2020 by Dr. Wynetta Fines Carolyn Myers is a 84 y.o. female with medical history significant of CKD stage III, bilateral renal artery stenosis, chronic right hip pain, hypertension, gout, thyroidism, presented with worsening of right hip pain, and AKI.  Patient was recently treated for E. coli UTI, by her PCP on 01/17/2020 with Augmentin.  Before that she was evaluated by orthopedic surgery for worsening of her right hip pain and was placed on Tylenol 3, and image study showed no significant right hip pathology but some lumbar vertebral arthritis.  He went to see her PCP yesterday and blood work showed significant elevation of creatinine level from her baseline 1.92 to 5.3.  She denied taking any NSAIDs on a regular basis. Regarding the right hip pain, she said the nature has been aching and sometimes shooting down to the back side of her right leg, she has been using a walker for ambulation.  Interim history Admitted for acute kidney injury, nephrology consulted and appreciated.  Patient noted to have UTI and has completed Augmentin therapy prior to admission.  Only complaining of right hip and back pain.  Patient noted to have a hemoglobin of 6.7 this morning, 2 units of PRBC have been transfused. Pending renal biopsy.  Assessment & Plan   Acute kidney injury on chronic kidney disease, stage IIIb -Possibly secondary to urinary tract infection, decreased oral intake, with ARB use, possible AIN from Augmentin use versus hypercalcemia -Creatinine on admission 5.18 -Renal ultrasound shows no obstructive pathology -Creatinine currently down to 3.35 -Nephrology consulted and appreciated, pending renal biopsy -Currently on IVF  Anemia of chronic disease -Hemoglobin was down to 6.7, 2 units PRBC transfused -hemoglobin currently 9.7 -FOBT pending -Anemia  panel shows adequate iron and storage -Continue to monitor CBC  Recurrent UTI -Patient recently completed Augmentin -UA showed few bacteria, 11-20 WBC, large leukocytes, negative nitrites -Urine culture showed no growth -Has been placed on ceftriaxone- will discontinue  Hypercalcemia -PTH 24 (WNL) and pending PTHrP -kappa free chain 49.9; lamda free light chains 12252.6 -Mspike 0.6 -25-hydroxy vitamin D 20.67 (low) -?MM -pending renal biopsy  Right hip pain/ back pain -?if due to the above vs MM -Bone survey: Multiple small rounded lucencies are noted in the visualized skull which may represent venous lakes, but lytic lesions related to multiple myeloma metastatic disease cannot be excluded.  Lytic destructive lesion seen in the proximal right fibular shaft consistent with metastatic disease or malignancy -PT consulted and recommended SNF  Gout -uric acid 17.1  Essential Hypertension -ARB held acute kidney injury -Continue hydralazine  Hypothyroidism -Continue Synthroid  Hypokalemia -resolved with repletion -monitor BMP  DVT Prophylaxis Heparin  Code Status: DNR  Family Communication: None at bedside  Disposition Plan: Admitted.  Patient from home, presented with hip pain.  Found to have worsening creatinine on chronic kidney disease, stage III.  Nephrology consulted and appreciated, currently on IV fluids, pending renal biopsy. Will likely need hem/onc consult. Disposition possible SNF.  Consultants Nephrology Interventional radiology Oncology  Procedures  Renal ultrasound  Antibiotics   Anti-infectives (From admission, onward)   Start     Dose/Rate Route Frequency Ordered Stop   01/23/20 1930  cefTRIAXone (ROCEPHIN) 1 g in sodium chloride 0.9 % 100 mL IVPB     1 g 200 mL/hr over 30 Minutes Intravenous Every 24 hours 01/23/20 1919  01/28/20 1929      Subjective:   Carolyn Myers seen and examined today.  Continues to have pain on her entire right side.   Denies current chest pain or shortness of breath, abdominal pain, nausea or vomiting, diarrhea or constipation, dizziness or headache.  Objective:   Vitals:   01/25/20 2011 01/25/20 2307 01/26/20 0432 01/26/20 0446  BP: (!) 156/53 (!) 161/59 (!) 163/71   Pulse: 61 64 60   Resp: (!) 22 (!) 21 18   Temp: 98.8 F (37.1 C) 99.7 F (37.6 C) 98.4 F (36.9 C)   TempSrc: Oral Oral Oral   SpO2: 99% 98% 97%   Weight:    74.6 kg  Height:        Intake/Output Summary (Last 24 hours) at 01/26/2020 0930 Last data filed at 01/26/2020 0300 Gross per 24 hour  Intake 220 ml  Output 1300 ml  Net -1080 ml   Filed Weights   01/24/20 0408 01/25/20 0407 01/26/20 0446  Weight: 73.9 kg 74.8 kg 74.6 kg   Exam  General: Well developed, well nourished, elderly, NAD  HEENT: NCAT, PERRLA, EOMI, Anicteic Sclera, mucous membranes moist.   Neck: Supple, no JVD, no masses  Cardiovascular: S1 S2 auscultated, SEM, RRR  Respiratory: Clear to auscultation bilaterally with equal chest rise  Abdomen: Soft, nontender, nondistended, + bowel sounds  Extremities: warm dry without cyanosis clubbing or edema  Neuro: AAOx3, nonfocal  Psych: Depressed mood and affect   Data Reviewed: I have personally reviewed following labs and imaging studies  CBC: Recent Labs  Lab 01/22/20 1613 01/22/20 1613 01/23/20 1323 01/24/20 0523 01/24/20 1930 01/25/20 0331 01/26/20 0640  WBC 15.1*  --  14.6* 7.9  --  8.8 8.7  HGB 8.5 Repeated and verified X2.*   < > 8.1* 6.7* 9.6* 9.6* 9.7*  HCT 26.0*   < > 25.3* 20.9* 29.3* 29.4* 30.4*  MCV 86.1  --  88.5 88.9  --  84.2 84.0  PLT 184.0  --  164 138*  --  129* 123*   < > = values in this interval not displayed.   Basic Metabolic Panel: Recent Labs  Lab 01/22/20 1613 01/22/20 1613 01/23/20 1323 01/23/20 1724 01/24/20 0523 01/25/20 0331 01/26/20 0640  NA 137  --  138  --  139 137 139  K 3.9  --  4.0  --  3.7 3.3* 4.0  CL 96  --  95*  --  102 102 108  CO2 27   --  27  --  26 24 21*  GLUCOSE 132*  --  114*  --  100* 99 95  BUN 52*  --  53*  --  49* 44* 35*  CREATININE 5.33*  --  5.18*  --  4.77* 3.94* 3.35*  CALCIUM 12.6*   < > 12.1* 11.4* 11.0* 10.8* 11.1*  MG  --   --   --   --   --  2.2  --   PHOS  --   --   --   --  5.1* 4.5  --    < > = values in this interval not displayed.   GFR: Estimated Creatinine Clearance: 12.7 mL/min (A) (by C-G formula based on SCr of 3.35 mg/dL (H)). Liver Function Tests: Recent Labs  Lab 01/24/20 0523 01/25/20 0331  ALBUMIN 2.9* 2.9*   No results for input(s): LIPASE, AMYLASE in the last 168 hours. No results for input(s): AMMONIA in the last 168 hours. Coagulation Profile: Recent  Labs  Lab 01/26/20 0640  INR 1.0   Cardiac Enzymes: Recent Labs  Lab 01/23/20 1850  CKTOTAL 91   BNP (last 3 results) No results for input(s): PROBNP in the last 8760 hours. HbA1C: No results for input(s): HGBA1C in the last 72 hours. CBG: No results for input(s): GLUCAP in the last 168 hours. Lipid Profile: No results for input(s): CHOL, HDL, LDLCALC, TRIG, CHOLHDL, LDLDIRECT in the last 72 hours. Thyroid Function Tests: No results for input(s): TSH, T4TOTAL, FREET4, T3FREE, THYROIDAB in the last 72 hours. Anemia Panel: Recent Labs    01/23/20 1850  FERRITIN 914*  TIBC 223*  IRON 93   Urine analysis:    Component Value Date/Time   COLORURINE YELLOW 01/23/2020 1855   APPEARANCEUR CLOUDY (A) 01/23/2020 1855   LABSPEC 1.012 01/23/2020 1855   PHURINE 8.0 01/23/2020 1855   GLUCOSEU NEGATIVE 01/23/2020 1855   HGBUR NEGATIVE 01/23/2020 1855   BILIRUBINUR NEGATIVE 01/23/2020 1855   BILIRUBINUR negative 01/17/2020 1535   KETONESUR NEGATIVE 01/23/2020 1855   PROTEINUR 30 (A) 01/23/2020 1855   UROBILINOGEN 0.2 01/17/2020 1535   NITRITE NEGATIVE 01/23/2020 1855   LEUKOCYTESUR LARGE (A) 01/23/2020 1855   Sepsis Labs: '@LABRCNTIP'$ (procalcitonin:4,lacticidven:4)  ) Recent Results (from the past 240 hour(s))    Urine Culture     Status: Abnormal   Collection Time: 01/17/20  3:14 PM   Specimen: Urine  Result Value Ref Range Status   MICRO NUMBER: [id number]  Final   SPECIMEN QUALITY: Adequate  Final   Sample Source NOT GIVEN  Final   STATUS: FINAL  Final   ISOLATE 1: Escherichia coli (A)  Final    Comment: Greater than 100,000 CFU/mL of Escherichia coli      Susceptibility   Escherichia coli - URINE CULTURE, REFLEX    AMOX/CLAVULANIC <=2 Sensitive     AMPICILLIN 8 Sensitive     AMPICILLIN/SULBACTAM <=2 Sensitive     CEFAZOLIN* <=4 Not Reportable      * For infections other than uncomplicated UTIcaused by E. coli, K. pneumoniae or P. mirabilis:Cefazolin is resistant if MIC > or = 8 mcg/mL.(Distinguishing susceptible versus intermediatefor isolates with MIC < or = 4 mcg/mL requiresadditional testing.)For uncomplicated UTI caused by E. coli,K. pneumoniae or P. mirabilis: Cefazolin issusceptible if MIC <32 mcg/mL and predictssusceptible to the oral agents cefaclor, cefdinir,cefpodoxime, cefprozil, cefuroxime, cephalexinand loracarbef.    CEFEPIME <=1 Sensitive     CEFTRIAXONE <=1 Sensitive     CIPROFLOXACIN >=4 Resistant     LEVOFLOXACIN >=8 Resistant     ERTAPENEM <=0.5 Sensitive     GENTAMICIN <=1 Sensitive     IMIPENEM <=0.25 Sensitive     NITROFURANTOIN <=16 Sensitive     PIP/TAZO <=4 Sensitive     TOBRAMYCIN <=1 Sensitive     TRIMETH/SULFA* <=20 Sensitive      * For infections other than uncomplicated UTIcaused by E. coli, K. pneumoniae or P. mirabilis:Cefazolin is resistant if MIC > or = 8 mcg/mL.(Distinguishing susceptible versus intermediatefor isolates with MIC < or = 4 mcg/mL requiresadditional testing.)For uncomplicated UTI caused by E. coli,K. pneumoniae or P. mirabilis: Cefazolin issusceptible if MIC <32 mcg/mL and predictssusceptible to the oral agents cefaclor, cefdinir,cefpodoxime, cefprozil, cefuroxime, cephalexinand loracarbef.Legend:S = Susceptible  I = IntermediateR = Resistant   NS = Not susceptible* = Not tested  NR = Not reported**NN = See antimicrobic comments  SARS CORONAVIRUS 2 (TAT 6-24 HRS) Nasopharyngeal Nasopharyngeal Swab     Status: None   Collection Time: 01/23/20  3:06  PM   Specimen: Nasopharyngeal Swab  Result Value Ref Range Status   SARS Coronavirus 2 NEGATIVE NEGATIVE Final    Comment: (NOTE) SARS-CoV-2 target nucleic acids are NOT DETECTED. The SARS-CoV-2 RNA is generally detectable in upper and lower respiratory specimens during the acute phase of infection. Negative results do not preclude SARS-CoV-2 infection, do not rule out co-infections with other pathogens, and should not be used as the sole basis for treatment or other patient management decisions. Negative results must be combined with clinical observations, patient history, and epidemiological information. The expected result is Negative. Fact Sheet for Patients: SugarRoll.be Fact Sheet for Healthcare Providers: https://www.woods-mathews.com/ This test is not yet approved or cleared by the [place] FDA and  has been authorized for detection and/or diagnosis of SARS-CoV-2 by FDA under an Emergency Use Authorization (EUA). This EUA will remain  in effect (meaning this test can be used) for the duration of the COVID-19 declaration under Section 56 4(b)(1) of the Act, 21 U.S.C. section 360bbb-3(b)(1), unless the authorization is terminated or revoked sooner. Performed at [facility], [place] [address], [place], [place] [address]   Culture, Urine     Status: None   Collection Time: 01/23/20  6:54 PM   Specimen: Urine, Clean Catch  Result Value Ref Range Status   Specimen Description URINE, CLEAN CATCH  Final   Special Requests Normal  Final   Culture   Final    NO GROWTH Performed at [facility], [place] [address] [place], [place] [address]    Report Status 01/24/2020 FINAL  Final      Radiology Studies: DG Bone Survey  Met  Result Date: 01/25/2020 CLINICAL DATA:  Low back pain.  Chronic kidney disease stage 3. EXAM: METASTATIC BONE SURVEY COMPARISON:  None. FINDINGS: Multiple small rounded lucencies are noted in the visualized skull which may represent venous lakes, but lytic lesions cannot be excluded. There is noted a lytic destructive lesion seen involving the proximal right fibular shaft concerning for metastatic disease or malignancy. No other focal abnormality is noted. IMPRESSION: Multiple small rounded lucencies are noted in the visualized skull which may represent venous lakes, but lytic lesions related to multiple myeloma metastatic disease cannot be excluded. Also noted is lytic destructive lesion seen involving the proximal right fibular shaft consistent with metastatic disease or malignancy. Electronically Signed   By: Marijo Conception M.D.   On: 01/25/2020 11:27     Scheduled Meds: . sodium chloride   Intravenous Once  . [START ON 01/27/2020] heparin  5,000 Units Subcutaneous Q12H  . hydrALAZINE  100 mg Oral TID  . levothyroxine  88 mcg Oral Q0600  . rosuvastatin  10 mg Oral QHS   Continuous Infusions: . cefTRIAXone (ROCEPHIN)  IV 1 g (01/25/20 2026)     LOS: 3 days   Time Spent in minutes   45 minutes  Raphaella Larkin D.O. on 01/26/2020 at 9:30 AM  Between 7am to 7pm - Please see pager noted on [email or web address]  After 7pm go to [email or web address]  And look for the night coverage person covering for me after hours  [facility] Office  [phone]

## 2020-01-26 NOTE — Progress Notes (Signed)
Physical Therapy Treatment Patient Details Name: Carolyn Myers MRN: [id number] DOB: 1936/11/26 Today's Date: 01/26/2020    History of Present Illness Pt is an 84 yo female with CKD stage III, bilateral renal artery stenosis, HTN, and gout, who presented with worsening of right hip pain, and AKI following abnormal bloodwork at PCP on 2/1.    PT Comments    Pt limited by pain during session, resulting in slowed and labored gait. Despite pain patient is able to increase ambulation tolerance during session. PT and patient review HEP to continue from her time at home, as well as adding one exercise to improve hip extensor strength for improved power during transfers. Pt will continue to benefit from PT POC to improve activity tolerance and functional mobility quality. If patient has 24/7 assistance from family may be able to discharge home, however PT continues to recommend SNF until this is made available as pt requires assistance for all mobility at this time.  Follow Up Recommendations  SNF;Supervision/Assistance - 24 hour(could progress to home if increased family assistance)     Equipment Recommendations  Rolling walker with 5" wheels    Recommendations for Other Services       Precautions / Restrictions Precautions Precautions: Fall Restrictions Weight Bearing Restrictions: No    Mobility  Bed Mobility Overal bed mobility: Needs Assistance Bed Mobility: Supine to Sit     Supine to sit: Min assist        Transfers Overall transfer level: Needs assistance Equipment used: Rolling walker (2 wheeled) Transfers: Sit to/from Stand Sit to Stand: Min guard         General transfer comment: Pt providing cues for hand placement to improve transfer quality  Ambulation/Gait Ambulation/Gait assistance: Min guard Gait Distance (Feet): 40 Feet Assistive device: Rolling walker (2 wheeled) Gait Pattern/deviations: Step-to pattern Gait velocity: decreased Gait velocity  interpretation: <1.8 ft/sec, indicate of risk for recurrent falls General Gait Details: pt with short step to gait, significantly slowed gait speed   Stairs             Wheelchair Mobility    Modified Rankin (Stroke Patients Only)       Balance Overall balance assessment: Needs assistance Sitting-balance support: No upper extremity supported;Feet supported Sitting balance-Leahy Scale: Good Sitting balance - Comments: supervision at edge of bed   Standing balance support: Bilateral upper extremity supported Standing balance-Leahy Scale: Fair Standing balance comment: minG with BUE support of RW                            Cognition Arousal/Alertness: Awake/alert Behavior During Therapy: WFL for tasks assessed/performed Overall Cognitive Status: Within Functional Limits for tasks assessed                                        Exercises General Exercises - Lower Extremity Ankle Circles/Pumps: AROM;Both;10 reps Gluteal Sets: AROM;Both;5 reps Long Arc Quad: AROM;Both;10 reps    General Comments General comments (skin integrity, edema, etc.): VSS on RA      Pertinent Vitals/Pain Pain Assessment: Faces Faces Pain Scale: Hurts even more Pain Location: R flank Pain Descriptors / Indicators: Grimacing;Moaning Pain Intervention(s): Limited activity within patient's tolerance    Home Living                      Prior Function  PT Goals (current goals can now be found in the care plan section) Acute Rehab PT Goals Patient Stated Goal: return home Progress towards PT goals: Progressing toward goals    Frequency    Min 2X/week      PT Plan Current plan remains appropriate    Co-evaluation              AM-PAC PT "6 Clicks" Mobility   Outcome Measure  Help needed turning from your back to your side while in a flat bed without using bedrails?: A Little Help needed moving from lying on your back to  sitting on the side of a flat bed without using bedrails?: A Little Help needed moving to and from a bed to a chair (including a wheelchair)?: A Little Help needed standing up from a chair using your arms (e.g., wheelchair or bedside chair)?: A Little Help needed to walk in hospital room?: A Little Help needed climbing 3-5 steps with a railing? : A Lot 6 Click Score: 17    End of Session   Activity Tolerance: Patient tolerated treatment well Patient left: in chair;with call bell/phone within reach;with family/visitor present Nurse Communication: Mobility status PT Visit Diagnosis: Difficulty in walking, not elsewhere classified (R26.2);Muscle weakness (generalized) (M62.81)     Time: 1030-1053 PT Time Calculation (min) (ACUTE ONLY): 23 min  Charges:  $Gait Training: 8-22 mins $Therapeutic Activity: 8-22 mins                     Zenaida Niece, PT, DPT Acute Rehabilitation Pager: [phone]    Zenaida Niece 01/26/2020, 12:24 PM

## 2020-01-26 NOTE — Consult Note (Signed)
[place]  Telephone:(336) [phone] Fax:(336) [phone]   MEDICAL ONCOLOGY - INITIAL CONSULTATION  Referral MD: Dr. Cristal Ford  Reason for Referral: Elevated M spike and elevated kappa and lambda free light chains, anemia, thrombocytopenia, hypercalcemia  HPI: Ms. Crass is an 84 year old female with a past medical history significant for CKD stage III, bilateral renal artery stenosis, chronic right hip pain, hypertension, gout, and hypothyroidism. The patient presented to the emergency room with worsening right hip pain.  She has been evaluated by orthopedics in the past as an outpatient with imaging did not show any significant right hip pathology, but she did have some lumbar vertebral arthritis.  She had lab work performed at her primary care provider's office prior to admission which shows significant elevation of her creatinine from a baseline of 1.92 up to 5.3.  In the ER, labs showed a creatinine of 5.18 and a calcium of 12.1.  She had a renal ultrasound which was negative for any obstructive uropathy.  The patient is being followed by nephrology.  Due to her worsening renal function and hypercalcemia, work-up was sent out to rule out multiple myeloma.  SPEP from 01/23/2020 showed an M spike of 0.6.  Light chains obtained on 01/23/2020 showed a kappa free light chain of 49.9, lambda free light chain of 12,252.6, kappa, lambda light chain ratio could not be calculated.  She also had a bone survey performed on 01/25/2020 which showed multiple small rounded lucencies noted in the skull concerning for possible lytic lesions versus metastatic disease.  There was also a lytic destructive lesion in the proximal right fibular shaft consistent with metastatic disease or malignancy.    On admission, the patient CBC showed a hemoglobin of 8.1 and a normal platelet count of 164,000.  Her hemoglobin dropped to 6.7 on 01/24/2020 and she received 2 units PRBCs.  Platelet count has been drifting down  this admission and is down to 123,000 today.  Review of prior CBC shows that her platelet count has always been normal prior to this admission.  She has a longstanding history of anemia dating back to at least 2014.  However, recent baseline hemoglobin is in the 10-11 range.  The patient had just returned from a renal biopsy at the time of my visit. She was sleepy, but able to answer questions. Daughter, [name], was at the bedside and provided some of the history.  The patient developed worsening weakness, right hip pain, and difficulty walking prior to admission.  She also experienced anorexia and weight loss.  Her daughter is unsure how much weight she has lost.  She has had dizziness at times.  No headaches.  No recent fevers or chills.  No night sweats.  Reports some discomfort in her lower rib cage below her breast.  Denies abdominal pain.  Had one episode of vomiting this morning but no prior nausea, vomiting, constipation, diarrhea.  She has not noticed any bleeding.  The patient is widowed.  She has 8 children.  Her daughter at the bedside states that she checks on her daily.  No history of alcohol or tobacco use.  The patient is retired and used to work at [employer] as a Scientist, water quality in [employer]. Medical oncology was asked see the patient to make recommendations regarding her elevated M spike, elevated light chains, anemia, thrombocytopenia, and hypercalcemia.   Past Medical History:  Diagnosis Date  . Arthritis    "minor in my shoulders" (02/21/2016)  . Chicken pox   .  Diverticulitis   . Frequent headaches    "maybe twice/week" (03/12/2016)  . Gout    Right Foot  . History of blood transfusion    "said my blood was low"  . History of gout   . Hyperlipidemia   . Hypertension   . Hypothyroidism   . Migraine    "maybe twice/year" (02/21/2016)  . Palpitations    "doctor thought it was related to my thyroid"  . Sleep apnea    "I don't wear my mask" (02/21/2016)   :  Past Surgical History:  Procedure Laterality Date  . ABDOMINAL HYSTERECTOMY     "for fibroid tumors"  . TUBAL LIGATION    :  Current Facility-Administered Medications  Medication Dose Route Frequency Provider Last Rate Last Admin  . 0.9 %  sodium chloride infusion (Manually program via Guardrails IV Fluids)   Intravenous Once Lang Snow, FNP      . 0.9 %  sodium chloride infusion   Intravenous Continuous Rosita Fire, MD      . acetaminophen-codeine (TYLENOL #3) 300-30 MG per tablet 0.5-1 tablet  0.5-1 tablet Oral Q8H PRN Lequita Halt, MD   1 tablet at 01/24/20 2039  . calcitonin (MIACALCIN) injection 298 Units  4 Units/kg Subcutaneous BID Rosita Fire, MD      . cefTRIAXone (ROCEPHIN) 1 g in sodium chloride 0.9 % 100 mL IVPB  1 g Intravenous Q24H Wynetta Fines T, MD 200 mL/hr at 01/25/20 2026 1 g at 01/25/20 2026  . diclofenac Sodium (VOLTAREN) 1 % topical gel 2 g  2 g Topical QID PRN Lequita Halt, MD   2 g at 01/23/20 2259  . [START ON 01/27/2020] heparin injection 5,000 Units  5,000 Units Subcutaneous Q12H Allred, Darrell K, PA-C      . hydrALAZINE (APRESOLINE) tablet 100 mg  100 mg Oral TID Wynetta Fines T, MD   100 mg at 01/26/20 0955  . HYDROmorphone (DILAUDID) tablet 2 mg  2 mg Oral Q4H PRN Wynetta Fines T, MD      . levothyroxine (SYNTHROID) tablet 88 mcg  88 mcg Oral Q0600 Lequita Halt, MD   88 mcg at 01/26/20 0557  . rosuvastatin (CRESTOR) tablet 10 mg  10 mg Oral QHS Wynetta Fines T, MD   10 mg at 01/25/20 2302     Allergies  Allergen Reactions  . Pineapple Swelling and Other (See Comments)    Reaction to fresh pineapple - tongue swells, but breathing is not affected  :  Family History  Problem Relation Age of Onset  . Heart disease Mother   . Heart attack Mother        died from it  . Hypertension Mother   . Arthritis Mother   . Stroke Father   . Cancer Maternal Grandmother   . Heart attack Maternal Aunt   . Renal Disease Maternal Aunt   .  Multiple myeloma Maternal Uncle   . Emphysema Maternal Uncle   . Heart disease Sister   . Thyroid disease Sister   . Healthy Son        x3  . Healthy Daughter        x5  :  Social History   Socioeconomic History  . Marital status: Widowed    Spouse name: Not on file  . Number of children: Not on file  . Years of education: Not on file  . Highest education level: Not on file  Occupational History  . Occupation: Retired  Tobacco Use  . Smoking status: Never Smoker  . Smokeless tobacco: Never Used  Substance and Sexual Activity  . Alcohol use: No  . Drug use: No  . Sexual activity: Never  Other Topics Concern  . Not on file  Social History Narrative   Lives alone, family helps in her care.   Social Determinants of Health   Financial Resource Strain:   . Difficulty of Paying Living Expenses: Not on file  Food Insecurity:   . Worried About Charity fundraiser in the Last Year: Not on file  . Ran Out of Food in the Last Year: Not on file  Transportation Needs:   . Lack of Transportation (Medical): Not on file  . Lack of Transportation (Non-Medical): Not on file  Physical Activity:   . Days of Exercise per Week: Not on file  . Minutes of Exercise per Session: Not on file  Stress:   . Feeling of Stress : Not on file  Social Connections:   . Frequency of Communication with Friends and Family: Not on file  . Frequency of Social Gatherings with Friends and Family: Not on file  . Attends Religious Services: Not on file  . Active Member of Clubs or Organizations: Not on file  . Attends Archivist Meetings: Not on file  . Marital Status: Not on file  Intimate Partner Violence:   . Fear of Current or Ex-Partner: Not on file  . Emotionally Abused: Not on file  . Physically Abused: Not on file  . Sexually Abused: Not on file  :  Review of Systems: A comprehensive 14 point review of systems was negative except as noted in the HPI.  Exam: Patient Vitals for the  past 24 hrs:  BP Temp Temp src Pulse Resp SpO2 Weight  01/26/20 0446 - - - - - - 164 lb 7.4 oz (74.6 kg)  01/26/20 0432 (!) 163/71 98.4 F (36.9 C) Oral 60 18 97 % -  01/25/20 2307 (!) 161/59 99.7 F (37.6 C) Oral 64 (!) 21 98 % -  01/25/20 2011 (!) 156/53 98.8 F (37.1 C) Oral 61 (!) 22 99 % -  01/25/20 1631 (!) 149/57 - - 63 - - -  01/25/20 1123 (!) 160/58 98.5 F (36.9 C) Oral 64 15 97 % -    General:  Elderly female, no distress.   Eyes:  no scleral icterus.   ENT:  There were no oropharyngeal lesions.   Neck was without thyromegaly.   Lymphatics:  Negative cervical, supraclavicular or axillary adenopathy.   Respiratory: lungs were clear bilaterally without wheezing or crackles.   Cardiovascular:  Regular rate and rhythm, S1/S2, without murmur, rub or gallop.  There was no pedal edema.   GI:  abdomen was soft, flat, nontender, nondistended, without organomegaly.   Musculoskeletal:  no spinal tenderness of palpation of vertebral spine.   Skin exam was without echymosis, petichae.   Neuro exam was nonfocal. Patient was alert and oriented.  Attention was good.   Language was appropriate.  Mood was normal without depression.  Speech was not pressured.  Thought content was not tangential.     Lab Results  Component Value Date   WBC 8.7 01/26/2020   HGB 9.7 (L) 01/26/2020   HCT 30.4 (L) 01/26/2020   PLT 123 (L) 01/26/2020   GLUCOSE 95 01/26/2020   CHOL 233 (H) 08/03/2019   TRIG 96.0 08/03/2019   HDL 70.50 08/03/2019   LDLCALC 143 (H) 08/03/2019  ALT 6 01/17/2020   AST 24 01/17/2020   NA 139 01/26/2020   K 4.0 01/26/2020   CL 108 01/26/2020   CREATININE 3.35 (H) 01/26/2020   BUN 35 (H) 01/26/2020   CO2 21 (L) 01/26/2020    DG Chest 2 View  Result Date: 01/17/2020 CLINICAL DATA:  84 year old female with weakness and malaise for 1 week. Back pain and urinary symptoms. EXAM: CHEST - 2 VIEW COMPARISON:  08/22/2018 chest radiographs and earlier. FINDINGS: AP and lateral  views. Lung volumes and mediastinal contours are stable and within normal limits. Visualized tracheal air column is within normal limits. Both lungs appear clear. No pneumothorax or pleural effusion. No acute osseous abnormality identified. Negative visible bowel gas pattern. IMPRESSION: No acute cardiopulmonary abnormality. Electronically Signed   By: Genevie Ann M.D.   On: 01/17/2020 16:07   DG Lumbar Spine Complete  Result Date: 12/28/2019 CLINICAL DATA:  Low back pain EXAM: LUMBAR SPINE - COMPLETE 4+ VIEW COMPARISON:  CT 02/21/2016 FINDINGS: Minimal scoliosis of the spine. Sagittal alignment is within normal limits. Vertebral body heights are maintained. Mild degenerative changes at L4-L5 and L5-S1. Aortic atherosclerosis. IMPRESSION: Mild degenerative changes.  No acute osseous abnormality. Electronically Signed   By: Donavan Foil M.D.   On: 12/28/2019 22:12   US Renal  Result Date: 01/23/2020 CLINICAL DATA:  Right-sided flank pain for 2 weeks EXAM: RENAL / URINARY TRACT ULTRASOUND COMPLETE COMPARISON:  02/21/2016 FINDINGS: Right Kidney: Renal measurements: 10.6 x 5.0 by 5.1 cm = volume: 141 mL. Diffuse increased renal cortical echotexture and decreased corticomedullary differentiation, compatible with medical renal disease. Multifocal areas of cortical thinning and scarring. Multiple small simple renal cysts measuring up to 1.5 cm. Left Kidney: Renal measurements: 9.5 x 5.9 x 5.0 cm = volume: 148 mL. Diffuse increased renal cortical echotexture and decreased corticomedullary differentiation, compatible with medical renal disease. Bladder: Appears normal for degree of bladder distention. Other: No evidence of urinary tract calculi or obstructive uropathy. IMPRESSION: 1. Increased renal cortical echotexture consistent with medical renal disease. 2. Chronic right renal cortical thinning and scarring, not appreciably changed since prior CT exam. 3. Small simple cysts right kidney. Electronically Signed   By:  Randa Ngo M.D.   On: 01/23/2020 15:49   DG Bone Survey Met  Result Date: 01/25/2020 CLINICAL DATA:  Low back pain.  Chronic kidney disease stage 3. EXAM: METASTATIC BONE SURVEY COMPARISON:  None. FINDINGS: Multiple small rounded lucencies are noted in the visualized skull which may represent venous lakes, but lytic lesions cannot be excluded. There is noted a lytic destructive lesion seen involving the proximal right fibular shaft concerning for metastatic disease or malignancy. No other focal abnormality is noted. IMPRESSION: Multiple small rounded lucencies are noted in the visualized skull which may represent venous lakes, but lytic lesions related to multiple myeloma metastatic disease cannot be excluded. Also noted is lytic destructive lesion seen involving the proximal right fibular shaft consistent with metastatic disease or malignancy. Electronically Signed   By: Marijo Conception M.D.   On: 01/25/2020 11:27   DG HIP UNILAT W OR W/O PELVIS 2-3 VIEWS RIGHT  Result Date: 01/22/2020 CLINICAL DATA:  Right hip pain, no history of injury, pain for 3 weeks EXAM: DG HIP (WITH OR WITHOUT PELVIS) 2-3V RIGHT COMPARISON:  None. FINDINGS: Frontal view of the pelvis as well as frontal and frogleg lateral views of the right hip are obtained. No acute displaced fracture. Joint spaces are well preserved. There is prominent spondylosis  within the lower lumbar spine greatest at L4-5 and L5-S1. Sacroiliac joints appear normal. IMPRESSION: 1. Unremarkable pelvis and right hip. 2. Lower lumbar spondylosis. Electronically Signed   By: Randa Ngo M.D.   On: 01/22/2020 16:37     DG Chest 2 View  Result Date: 01/17/2020 CLINICAL DATA:  84 year old female with weakness and malaise for 1 week. Back pain and urinary symptoms. EXAM: CHEST - 2 VIEW COMPARISON:  08/22/2018 chest radiographs and earlier. FINDINGS: AP and lateral views. Lung volumes and mediastinal contours are stable and within normal limits. Visualized  tracheal air column is within normal limits. Both lungs appear clear. No pneumothorax or pleural effusion. No acute osseous abnormality identified. Negative visible bowel gas pattern. IMPRESSION: No acute cardiopulmonary abnormality. Electronically Signed   By: Genevie Ann M.D.   On: 01/17/2020 16:07   DG Lumbar Spine Complete  Result Date: 12/28/2019 CLINICAL DATA:  Low back pain EXAM: LUMBAR SPINE - COMPLETE 4+ VIEW COMPARISON:  CT 02/21/2016 FINDINGS: Minimal scoliosis of the spine. Sagittal alignment is within normal limits. Vertebral body heights are maintained. Mild degenerative changes at L4-L5 and L5-S1. Aortic atherosclerosis. IMPRESSION: Mild degenerative changes.  No acute osseous abnormality. Electronically Signed   By: Donavan Foil M.D.   On: 12/28/2019 22:12   US Renal  Result Date: 01/23/2020 CLINICAL DATA:  Right-sided flank pain for 2 weeks EXAM: RENAL / URINARY TRACT ULTRASOUND COMPLETE COMPARISON:  02/21/2016 FINDINGS: Right Kidney: Renal measurements: 10.6 x 5.0 by 5.1 cm = volume: 141 mL. Diffuse increased renal cortical echotexture and decreased corticomedullary differentiation, compatible with medical renal disease. Multifocal areas of cortical thinning and scarring. Multiple small simple renal cysts measuring up to 1.5 cm. Left Kidney: Renal measurements: 9.5 x 5.9 x 5.0 cm = volume: 148 mL. Diffuse increased renal cortical echotexture and decreased corticomedullary differentiation, compatible with medical renal disease. Bladder: Appears normal for degree of bladder distention. Other: No evidence of urinary tract calculi or obstructive uropathy. IMPRESSION: 1. Increased renal cortical echotexture consistent with medical renal disease. 2. Chronic right renal cortical thinning and scarring, not appreciably changed since prior CT exam. 3. Small simple cysts right kidney. Electronically Signed   By: Randa Ngo M.D.   On: 01/23/2020 15:49   DG Bone Survey Met  Result Date:  01/25/2020 CLINICAL DATA:  Low back pain.  Chronic kidney disease stage 3. EXAM: METASTATIC BONE SURVEY COMPARISON:  None. FINDINGS: Multiple small rounded lucencies are noted in the visualized skull which may represent venous lakes, but lytic lesions cannot be excluded. There is noted a lytic destructive lesion seen involving the proximal right fibular shaft concerning for metastatic disease or malignancy. No other focal abnormality is noted. IMPRESSION: Multiple small rounded lucencies are noted in the visualized skull which may represent venous lakes, but lytic lesions related to multiple myeloma metastatic disease cannot be excluded. Also noted is lytic destructive lesion seen involving the proximal right fibular shaft consistent with metastatic disease or malignancy. Electronically Signed   By: Marijo Conception M.D.   On: 01/25/2020 11:27   DG HIP UNILAT W OR W/O PELVIS 2-3 VIEWS RIGHT  Result Date: 01/22/2020 CLINICAL DATA:  Right hip pain, no history of injury, pain for 3 weeks EXAM: DG HIP (WITH OR WITHOUT PELVIS) 2-3V RIGHT COMPARISON:  None. FINDINGS: Frontal view of the pelvis as well as frontal and frogleg lateral views of the right hip are obtained. No acute displaced fracture. Joint spaces are well preserved. There is prominent spondylosis within  the lower lumbar spine greatest at L4-5 and L5-S1. Sacroiliac joints appear normal. IMPRESSION: 1. Unremarkable pelvis and right hip. 2. Lower lumbar spondylosis. Electronically Signed   By: Randa Ngo M.D.   On: 01/22/2020 16:37   Assessment and Plan:   Mildly elevated M spike and elevated light chains -The patient was admitted with worsening renal function, anemia, thrombocytopenia, and hypercalcemia -SPEP and light chains obtained on 01/23/2020 showed a mildly elevated M spike at 0.6, elevated kappa free light chains at 49.9, and significantly elevated lambda free light chains at 12,252.6 -Bone survey obtained on 01/25/2020 showed areas in the skull  concerning for lytic lesions versus metastatic disease and a lytic destructive lesion in the proximal right fibular shaft concerning for metastatic disease or malignancy -Discussed work-up to date with patient and her daughter and that this is concerning for multiple myeloma -Discussed need for additional work-up including 24-hour urine for UPEP/UIFE/light chains, immunofixation with quantitative immunoglobulins, and bone marrow biopsy by interventional radiology -Once additional work-up obtained, will have a more detailed discussion regarding treatment options  Anemia -The patient has longstanding anemia dating back to at least 2014 with a hemoglobin typically in the 10-11 range -Presented with worsening anemia on admission requiring 2 units PRBCs -Iron studies did not show any evidence of iron deficiency anemia -Suspect anemia is related to CKD and underlying malignancy -Monitor CBC closely and transfuse for hemoglobin less than 7  Thrombocytopenia -The patient had a normal platelet count prior to this admission -Has now developed mild thrombocytopenia -Suspect due to underlying malignancy -Closely monitor  Hypercalcemia -Calcium on admission was 12.6 -PTH was normal at 24, vitamin D, 25-hydroxy was low at 20.67, PTH related peptide pending -Slowly trending downward with IV fluids -Calcitonin 4 units/kg has been ordered by nephrology starting today -We will avoid the use of Zometa given renal dysfunction.  Will discuss with pharmacy the possibility of administering Xgeva while inpatient if no improvement with the calcitonin.  Acute on chronic kidney disease -Baseline creatinine 1.7-1.9 and now with creatinine as high as 5.33 -Creatinine slowly trending downward -Suspect AKI due to underlying elevated light chains -She also had recent UTI, decreased oral intake, and concomitant use of ARB -Renal biopsy performed 01/26/2020 and results are pending   Thank you for this referral.    Mikey Bussing, DNP, AGPCNP-BC, AOCNP

## 2020-01-26 NOTE — Progress Notes (Signed)
[facility] NEPHROLOGY PROGRESS NOTE  Assessment/ Plan: Pt is a 84 y.o. yo female with hypertension, gout, CKD stage III presented with worsening right hip pain and acute worsening of kidney disease.  # Acute kidney injury on chronic kidney disease stage III: Baseline Cr appears to be 1.7-1.9.    AKI likely likely due to lambda chain myeloma.  Patient also has UTI, decreased oral intake and concomitant use of ARB. Renal ultrasound does not show any obstructive pathology.   UA with only 30 mg proteinuria but UP/C is 6 g. SPEP with 0.6 M-spike, kappa 49.9, lambda [id number].   Plan for kidney biopsy today. Patient is nonoliguric and serum creatinine level trending down.  Continue to monitor.  No need for dialysis.    # Hypercalcemia due to paraproteinemia. PTH 24, Vit D 20.  Bone survey with multiple lytic lesions consistent with multiple myeloma.  Calcium level elevated.  Since he is n.p.o I will resume gentle IV fluid.  I will order 2 doses of calcitonin.  # Hypertension: Monitor blood pressure.  Continue hydralazine.  Off of ARB.  # Anemia: Iron saturation 42%.  Evaluation for paraproteinemia ongoing.  Monitor hemoglobin.  # Recurrent urinary tract infection: On ceftriaxone.  Subjective: Seen and examined at bedside.  Patient reports poor appetite.  Denies nausea vomiting chest pain shortness of breath.  Plan for biopsy today. Objective Vital signs in last 24 hours: Vitals:   01/25/20 2011 01/25/20 2307 01/26/20 0432 01/26/20 0446  BP: (!) 156/53 (!) 161/59 (!) 163/71   Pulse: 61 64 60   Resp: (!) 22 (!) 21 18   Temp: 98.8 F (37.1 C) 99.7 F (37.6 C) 98.4 F (36.9 C)   TempSrc: Oral Oral Oral   SpO2: 99% 98% 97%   Weight:    74.6 kg  Height:       Weight change: -0.2 kg  Intake/Output Summary (Last 24 hours) at 01/26/2020 0942 Last data filed at 01/26/2020 0300 Gross per 24 hour  Intake 100 ml  Output 1300 ml  Net -1200 ml       Labs: Basic  Metabolic Panel: Recent Labs  Lab 01/24/20 0523 01/25/20 0331 01/26/20 0640  NA 139 137 139  K 3.7 3.3* 4.0  CL 102 102 108  CO2 26 24 21*  GLUCOSE 100* 99 95  BUN 49* 44* 35*  CREATININE 4.77* 3.94* 3.35*  CALCIUM 11.0* 10.8* 11.1*  PHOS 5.1* 4.5  --    Liver Function Tests: Recent Labs  Lab 01/24/20 0523 01/25/20 0331  ALBUMIN 2.9* 2.9*   No results for input(s): LIPASE, AMYLASE in the last 168 hours. No results for input(s): AMMONIA in the last 168 hours. CBC: Recent Labs  Lab 01/22/20 1613 01/22/20 1613 01/23/20 1323 01/23/20 1323 01/24/20 0523 01/24/20 0523 01/24/20 1930 01/25/20 0331 01/26/20 0640  WBC 15.1*   < > 14.6*   < > 7.9  --   --  8.8 8.7  HGB 8.5 Repeated and verified X2.*   < > 8.1*   < > 6.7*   < > 9.6* 9.6* 9.7*  HCT 26.0*   < > 25.3*   < > 20.9*   < > 29.3* 29.4* 30.4*  MCV 86.1  --  88.5  --  88.9  --   --  84.2 84.0  PLT 184.0   < > 164   < > 138*  --   --  129* 123*   < > = values in this interval  not displayed.   Cardiac Enzymes: Recent Labs  Lab 01/23/20 1850  CKTOTAL 91   CBG: No results for input(s): GLUCAP in the last 168 hours.  Iron Studies:  Recent Labs    01/23/20 1850  IRON 93  TIBC 223*  FERRITIN 914*   Studies/Results: DG Bone Survey Met  Result Date: 01/25/2020 CLINICAL DATA:  Low back pain.  Chronic kidney disease stage 3. EXAM: METASTATIC BONE SURVEY COMPARISON:  None. FINDINGS: Multiple small rounded lucencies are noted in the visualized skull which may represent venous lakes, but lytic lesions cannot be excluded. There is noted a lytic destructive lesion seen involving the proximal right fibular shaft concerning for metastatic disease or malignancy. No other focal abnormality is noted. IMPRESSION: Multiple small rounded lucencies are noted in the visualized skull which may represent venous lakes, but lytic lesions related to multiple myeloma metastatic disease cannot be excluded. Also noted is lytic destructive  lesion seen involving the proximal right fibular shaft consistent with metastatic disease or malignancy. Electronically Signed   By: Marijo Conception M.D.   On: 01/25/2020 11:27    Medications: Infusions: . cefTRIAXone (ROCEPHIN)  IV 1 g (01/25/20 2026)    Scheduled Medications: . sodium chloride   Intravenous Once  . [START ON 01/27/2020] heparin  5,000 Units Subcutaneous Q12H  . hydrALAZINE  100 mg Oral TID  . levothyroxine  88 mcg Oral Q0600  . rosuvastatin  10 mg Oral QHS    have reviewed scheduled and prn medications.  Physical Exam: General:NAD, comfortable Heart:RRR, s1s2 nl Lungs:clear b/l, no crackle Abdomen:soft, Non-tender, non-distended Extremities:No edema Neurology: Alert, awake, following commands.  Clifton Kovacic [name] 01/26/2020,9:42 AM  LOS: 3 days  Pager: [phone]

## 2020-01-26 NOTE — Progress Notes (Signed)
Chaplain engaged in initial visit with Carolyn Myers

## 2020-01-27 DIAGNOSIS — E876 Hypokalemia: Secondary | ICD-10-CM

## 2020-01-27 DIAGNOSIS — M549 Dorsalgia, unspecified: Secondary | ICD-10-CM

## 2020-01-27 LAB — BASIC METABOLIC PANEL
Anion gap: 12 (ref 5–15)
BUN: 30 mg/dL — ABNORMAL HIGH (ref 8–23)
CO2: 21 mmol/L — ABNORMAL LOW (ref 22–32)
Calcium: 10.3 mg/dL (ref 8.9–10.3)
Chloride: 106 mmol/L (ref 98–111)
Creatinine, Ser: 3.11 mg/dL — ABNORMAL HIGH (ref 0.44–1.00)
GFR calc Af Amer: 16 mL/min — ABNORMAL LOW (ref >60)
GFR calc non Af Amer: 13 mL/min — ABNORMAL LOW (ref >60)
Glucose, Bld: 108 mg/dL — ABNORMAL HIGH (ref 70–99)
Potassium: 4 mmol/L (ref 3.5–5.1)
Sodium: 139 mmol/L (ref 135–145)

## 2020-01-27 LAB — HEMOGLOBIN AND HEMATOCRIT, BLOOD
HCT: 29.8 % — ABNORMAL LOW (ref 36.0–46.0)
Hemoglobin: 9.7 g/dL — ABNORMAL LOW (ref 12.0–15.0)

## 2020-01-27 MED ORDER — POLYETHYLENE GLYCOL 3350 17 G PO PACK
17.0000 g | PACK | Freq: Every day | ORAL | Status: DC | PRN
  Administered 2020-01-27: 17 g via ORAL
  Filled 2020-01-27: qty 1

## 2020-01-27 MED ORDER — HEPARIN SODIUM (PORCINE) 5000 UNIT/ML IJ SOLN
5000.0000 [IU] | Freq: Three times a day (TID) | INTRAMUSCULAR | Status: AC
  Administered 2020-01-27 – 2020-01-29 (×8): 5000 [IU] via SUBCUTANEOUS
  Filled 2020-01-27 (×8): qty 1

## 2020-01-27 NOTE — Plan of Care (Signed)

## 2020-01-27 NOTE — Progress Notes (Signed)
24 hr urine collection not started on previous shift. Will start with patients next urination.

## 2020-01-27 NOTE — Progress Notes (Signed)
PROGRESS NOTE    Carolyn Myers  UKG:[id number] DOB: 10-26-1936 DOA: 01/23/2020 PCP: Darreld Mclean, MD   Brief Narrative:  HPI on 01/23/2020 by Dr. Wynetta Fines Carolyn Myers is a 84 y.o. female with medical history significant of CKD stage III, bilateral renal artery stenosis, chronic right hip pain, hypertension, gout, thyroidism, presented with worsening of right hip pain, and AKI.  Patient was recently treated for E. coli UTI, by her PCP on 01/17/2020 with Augmentin.  Before that she was evaluated by orthopedic surgery for worsening of her right hip pain and was placed on Tylenol 3, and image study showed no significant right hip pathology but some lumbar vertebral arthritis.  He went to see her PCP yesterday and blood work showed significant elevation of creatinine level from her baseline 1.92 to 5.3.  She denied taking any NSAIDs on a regular basis. Regarding the right hip pain, she said the nature has been aching and sometimes shooting down to the back side of her right leg, she has been using a walker for ambulation.  Interim history Admitted for acute kidney injury, nephrology consulted and appreciated.  Patient noted to have UTI and has completed Augmentin therapy prior to admission.  Only complaining of right hip and back pain.  Patient noted to have a hemoglobin of 6.7 this morning, 2 units of PRBC have been transfused. Pending renal biopsy.  Assessment & Plan   Acute kidney injury on chronic kidney disease, stage IIIb -Possibly secondary to urinary tract infection, decreased oral intake, with ARB use, possible AIN from Augmentin use versus hypercalcemia -Creatinine on admission 5.18 -Renal ultrasound shows no obstructive pathology -Creatinine currently down to 3.11 -Nephrology consulted and appreciated, pending renal biopsy results -Currently on IVF  Anemia of chronic disease -Hemoglobin was down to 6.7, 2 units PRBC transfused -hemoglobin currently 9.7 -FOBT  pending -Anemia panel shows adequate iron and storage -Continue to monitor CBC  Recurrent UTI -Patient recently completed Augmentin -UA showed few bacteria, 11-20 WBC, large leukocytes, negative nitrites -Urine culture showed no growth -Has been placed on ceftriaxone- will discontinue  Hypercalcemia -PTH 24 (WNL) and pending PTHrP -kappa free chain 49.9; lamda free light chains 12252.6 -Mspike 0.6 -25-hydroxy vitamin D 20.67 (low) -?MM -patient given calcitonin on 2/5- calcium trending downward  Right hip pain/ back pain -?if due to the above vs MM -Bone survey: Multiple small rounded lucencies are noted in the visualized skull which may represent venous lakes, but lytic lesions related to multiple myeloma metastatic disease cannot be excluded.  Lytic destructive lesion seen in the proximal right fibular shaft consistent with metastatic disease or malignancy -PT consulted and recommended SNF -oncology consulted and appreciated, suspect will need bone marrow biopsy  Gout -uric acid 17.1  Essential Hypertension -ARB held acute kidney injury -Continue hydralazine  Hypothyroidism -Continue Synthroid  Hypokalemia -resolved with repletion -monitor BMP  DVT Prophylaxis Heparin  Code Status: DNR  Family Communication: None at bedside  Disposition Plan: Admitted.  Patient from home, presented with hip pain.  Found to have worsening creatinine on chronic kidney disease, stage III.  Nephrology consulted and appreciated, currently on IV fluids, pending renal biopsy results and possible bone marrow biopsy. Disposition possible SNF.  Consultants Nephrology Interventional radiology Oncology  Procedures  Renal ultrasound Left renal biopsy   Antibiotics   Anti-infectives (From admission, onward)   Start     Dose/Rate Route Frequency Ordered Stop   01/23/20 1930  cefTRIAXone (ROCEPHIN) 1 g in sodium chloride 0.9 %  100 mL IVPB     1 g 200 mL/hr over 30 Minutes Intravenous  Every 24 hours 01/23/20 1919 01/28/20 1929      Subjective:   Carolyn Myers seen and examined today.  Continues to have pain in her back and hip. Denies current chest pain, shortness of breath, abdominal pain, N/V/D/C, dizziness, headache.  Objective:   Vitals:   01/26/20 1331 01/26/20 1356 01/26/20 2034 01/27/20 0544  BP: (!) 175/78 (!) 176/76 (!) 141/60 (!) 177/67  Pulse:  60 68 72  Resp:  _0 Temp:  98.9 F (37.2 C) 99.2 F (37.3 C) 98.8 F (37.1 C)  TempSrc:  Oral Oral Oral  SpO2:  99% 95% 95%  Weight:    74.9 kg  Height:        Intake/Output Summary (Last 24 hours) at 01/27/2020 1011 Last data filed at 01/27/2020 0600 Gross per 24 hour  Intake 570.06 ml  Output 1200 ml  Net -629.94 ml   Filed Weights   01/25/20 0407 01/26/20 0446 01/27/20 0544  Weight: 74.8 kg 74.6 kg 74.9 kg   Exam  General: Well developed, well nourished, NAD, appears stated age  25: NCAT, mucous membranes moist.   Cardiovascular: S1 S2 auscultated, SEM, RRR  Respiratory: Clear to auscultation bilaterally  Abdomen: Soft, nontender, nondistended, + bowel sounds  Extremities: warm dry without cyanosis clubbing or edema  Neuro: AAOx3, nonfocal  Psych: Appropriate mood and affect  Data Reviewed: I have personally reviewed following labs and imaging studies  CBC: Recent Labs  Lab 01/22/20 1613 01/22/20 1613 01/23/20 1323 01/23/20 1323 01/24/20 0523 01/24/20 1930 01/25/20 0331 01/26/20 0640 01/27/20 0513  WBC 15.1*  --  14.6*  --  7.9  --  8.8 8.7  --   HGB 8.5 Repeated and verified X2.*   < > 8.1*   < > 6.7* 9.6* 9.6* 9.7* 9.7*  HCT 26.0*   < > 25.3*   < > 20.9* 29.3* 29.4* 30.4* 29.8*  MCV 86.1  --  88.5  --  88.9  --  84.2 84.0  --   PLT 184.0  --  164  --  138*  --  129* 123*  --    < > = values in this interval not displayed.   Basic Metabolic Panel: Recent Labs  Lab 01/23/20 1323 01/23/20 1724 01/24/20 0523 01/25/20 0331 01/26/20 0640 01/27/20 0513  NA  138  --  139 137 139 139  K 4.0  --  3.7 3.3* 4.0 4.0  CL 95*  --  102 102 108 106  CO2 27  --  26 24 21* 21*  GLUCOSE 114*  --  100* 99 95 108*  BUN 53*  --  49* 44* 35* 30*  CREATININE 5.18*  --  4.77* 3.94* 3.35* 3.11*  CALCIUM 12.1* 11.4* 11.0* 10.8* 11.1* 10.3  MG  --   --   --  2.2  --   --   PHOS  --   --  5.1* 4.5  --   --    GFR: Estimated Creatinine Clearance: 13.8 mL/min (A) (by C-G formula based on SCr of 3.11 mg/dL (H)). Liver Function Tests: Recent Labs  Lab 01/24/20 0523 01/25/20 0331  ALBUMIN 2.9* 2.9*   No results for input(s): LIPASE, AMYLASE in the last 168 hours. No results for input(s): AMMONIA in the last 168 hours. Coagulation Profile: Recent Labs  Lab 01/26/20 0640  INR 1.0   Cardiac Enzymes: Recent Labs  Lab 01/23/20 1850  CKTOTAL 91   BNP (last 3 results) No results for input(s): PROBNP in the last 8760 hours. HbA1C: No results for input(s): HGBA1C in the last 72 hours. CBG: No results for input(s): GLUCAP in the last 168 hours. Lipid Profile: No results for input(s): CHOL, HDL, LDLCALC, TRIG, CHOLHDL, LDLDIRECT in the last 72 hours. Thyroid Function Tests: No results for input(s): TSH, T4TOTAL, FREET4, T3FREE, THYROIDAB in the last 72 hours. Anemia Panel: No results for input(s): VITAMINB12, FOLATE, FERRITIN, TIBC, IRON, RETICCTPCT in the last 72 hours. Urine analysis:    Component Value Date/Time   COLORURINE YELLOW 01/23/2020 1855   APPEARANCEUR CLOUDY (A) 01/23/2020 1855   LABSPEC 1.012 01/23/2020 1855   PHURINE 8.0 01/23/2020 1855   GLUCOSEU NEGATIVE 01/23/2020 1855   HGBUR NEGATIVE 01/23/2020 1855   BILIRUBINUR NEGATIVE 01/23/2020 1855   BILIRUBINUR negative 01/17/2020 1535   KETONESUR NEGATIVE 01/23/2020 1855   PROTEINUR 30 (A) 01/23/2020 1855   UROBILINOGEN 0.2 01/17/2020 1535   NITRITE NEGATIVE 01/23/2020 1855   LEUKOCYTESUR LARGE (A) 01/23/2020 1855   Sepsis Labs: _0 (procalcitonin:4,lacticidven:4)  ) Recent  Results (from the past 240 hour(s))  Urine Culture     Status: Abnormal   Collection Time: 01/17/20  3:14 PM   Specimen: Urine  Result Value Ref Range Status   MICRO NUMBER: [id number]  Final   SPECIMEN QUALITY: Adequate  Final   Sample Source NOT GIVEN  Final   STATUS: FINAL  Final   ISOLATE 1: Escherichia coli (A)  Final    Comment: Greater than 100,000 CFU/mL of Escherichia coli      Susceptibility   Escherichia coli - URINE CULTURE, REFLEX    AMOX/CLAVULANIC <=2 Sensitive     AMPICILLIN 8 Sensitive     AMPICILLIN/SULBACTAM <=2 Sensitive     CEFAZOLIN* <=4 Not Reportable      * For infections other than uncomplicated UTIcaused by E. coli, K. pneumoniae or P. mirabilis:Cefazolin is resistant if MIC > or = 8 mcg/mL.(Distinguishing susceptible versus intermediatefor isolates with MIC < or = 4 mcg/mL requiresadditional testing.)For uncomplicated UTI caused by E. coli,K. pneumoniae or P. mirabilis: Cefazolin issusceptible if MIC <32 mcg/mL and predictssusceptible to the oral agents cefaclor, cefdinir,cefpodoxime, cefprozil, cefuroxime, cephalexinand loracarbef.    CEFEPIME <=1 Sensitive     CEFTRIAXONE <=1 Sensitive     CIPROFLOXACIN >=4 Resistant     LEVOFLOXACIN >=8 Resistant     ERTAPENEM <=0.5 Sensitive     GENTAMICIN <=1 Sensitive     IMIPENEM <=0.25 Sensitive     NITROFURANTOIN <=16 Sensitive     PIP/TAZO <=4 Sensitive     TOBRAMYCIN <=1 Sensitive     TRIMETH/SULFA* <=20 Sensitive      * For infections other than uncomplicated UTIcaused by E. coli, K. pneumoniae or P. mirabilis:Cefazolin is resistant if MIC > or = 8 mcg/mL.(Distinguishing susceptible versus intermediatefor isolates with MIC < or = 4 mcg/mL requiresadditional testing.)For uncomplicated UTI caused by E. coli,K. pneumoniae or P. mirabilis: Cefazolin issusceptible if MIC <32 mcg/mL and predictssusceptible to the oral agents cefaclor, cefdinir,cefpodoxime, cefprozil, cefuroxime, cephalexinand loracarbef.Legend:S =  Susceptible  I = IntermediateR = Resistant  NS = Not susceptible* = Not tested  NR = Not reported**NN = See antimicrobic comments  SARS CORONAVIRUS 2 (TAT 6-24 HRS) Nasopharyngeal Nasopharyngeal Swab     Status: None   Collection Time: 01/23/20  3:06 PM   Specimen: Nasopharyngeal Swab  Result Value Ref Range Status   SARS Coronavirus 2 NEGATIVE NEGATIVE  Final    Comment: (NOTE) SARS-CoV-2 target nucleic acids are NOT DETECTED. The SARS-CoV-2 RNA is generally detectable in upper and lower respiratory specimens during the acute phase of infection. Negative results do not preclude SARS-CoV-2 infection, do not rule out co-infections with other pathogens, and should not be used as the sole basis for treatment or other patient management decisions. Negative results must be combined with clinical observations, patient history, and epidemiological information. The expected result is Negative. Fact Sheet for Patients: SugarRoll.be Fact Sheet for Healthcare Providers: https://www.woods-mathews.com/ This test is not yet approved or cleared by the [place] FDA and  has been authorized for detection and/or diagnosis of SARS-CoV-2 by FDA under an Emergency Use Authorization (EUA). This EUA will remain  in effect (meaning this test can be used) for the duration of the COVID-19 declaration under Section 56 4(b)(1) of the Act, 21 U.S.C. section 360bbb-3(b)(1), unless the authorization is terminated or revoked sooner. Performed at [facility], [place] [address], [place], [place] [address]   Culture, Urine     Status: None   Collection Time: 01/23/20  6:54 PM   Specimen: Urine, Clean Catch  Result Value Ref Range Status   Specimen Description URINE, CLEAN CATCH  Final   Special Requests Normal  Final   Culture   Final    NO GROWTH Performed at [facility], [place] [address], [place], [place] [address]    Report Status 01/24/2020 FINAL  Final       Radiology Studies: DG Bone Survey Met  Result Date: 01/25/2020 CLINICAL DATA:  Low back pain.  Chronic kidney disease stage 3. EXAM: METASTATIC BONE SURVEY COMPARISON:  None. FINDINGS: Multiple small rounded lucencies are noted in the visualized skull which may represent venous lakes, but lytic lesions cannot be excluded. There is noted a lytic destructive lesion seen involving the proximal right fibular shaft concerning for metastatic disease or malignancy. No other focal abnormality is noted. IMPRESSION: Multiple small rounded lucencies are noted in the visualized skull which may represent venous lakes, but lytic lesions related to multiple myeloma metastatic disease cannot be excluded. Also noted is lytic destructive lesion seen involving the proximal right fibular shaft consistent with metastatic disease or malignancy. Electronically Signed   By: Marijo Conception M.D.   On: 01/25/2020 11:27   US BIOPSY (KIDNEY)  Result Date: 01/26/2020 INDICATION: 84 year old female with acute on chronic kidney disease concerning for possible amyloidosis or multiple myeloma. EXAM: ULTRASOUND GUIDED RENAL BIOPSY COMPARISON:  None. MEDICATIONS: Fentanyl 50 mcg IV; Versed 1 mg IV ANESTHESIA/SEDATION: The patient's vital signs and level of consciousness were monitored continuously by radiology nursing under my direct supervision. Total Moderate Sedation time Thirteen minutes COMPLICATIONS: None immediate PROCEDURE: Informed written consent was obtained from the patient after a discussion of the risks, benefits and alternatives to treatment. The patient understands and consents the procedure. A timeout was performed prior to the initiation of the procedure. Ultrasound scanning was performed of the bilateral flanks. The inferior pole of the left kidney was selected for biopsy due to location and sonographic window. The procedure was planned. The operative site was prepped and draped in the usual sterile fashion. The  overlying soft tissues were anesthetized with 1% lidocaine with epinephrine. A 17 gauge core needle biopsy device was advanced into the inferior cortex of the left kidney and 3 core biopsies were obtained under direct ultrasound guidance. Images were saved for documentation purposes. The biopsy device was removed and hemostasis  was obtained with manual compression. Post procedural scanning was negative for significant post procedural hemorrhage or additional complication. A dressing was placed. The patient tolerated the procedure well without immediate post procedural complication. IMPRESSION: Technically successful ultrasound guided left renal biopsy. Electronically Signed   By: Jacqulynn Cadet M.D.   On: 01/26/2020 15:27     Scheduled Meds: . heparin  5,000 Units Subcutaneous Q12H  . hydrALAZINE  100 mg Oral TID  . levothyroxine  88 mcg Oral Q0600  . rosuvastatin  10 mg Oral QHS   Continuous Infusions: . cefTRIAXone (ROCEPHIN)  IV 1 g (01/26/20 2128)     LOS: 4 days   Time Spent in minutes   30 minutes  Mekel Haverstock D.O. on 01/27/2020 at 10:11 AM  Between 7am to 7pm - Please see pager noted on [email or web address]  After 7pm go to [email or web address]  And look for the night coverage person covering for me after hours  [facility] Office  [phone]

## 2020-01-27 NOTE — Plan of Care (Signed)
  Problem: Nutrition: Goal: Adequate nutrition will be maintained Outcome: Completed/Met   Problem: Coping: Goal: Level of anxiety will decrease Outcome: Completed/Met   Problem: Pain Managment: Goal: General experience of comfort will improve Outcome: Completed/Met   Problem: Safety: Goal: Ability to remain free from injury will improve Outcome: Completed/Met   Problem: Skin Integrity: Goal: Risk for impaired skin integrity will decrease Outcome: Completed/Met

## 2020-01-27 NOTE — Progress Notes (Signed)
[facility] PROGRESS NOTE  Assessment/ Plan: Pt is a 85 y.o. yo female with hypertension, gout, CKD stage III presented with worsening right hip pain and acute worsening of kidney disease.  # Acute kidney injury on chronic kidney disease stage III: Baseline Cr appears to be 1.7-1.9.    AKI likely likely due to lambda chain myeloma.  Patient also has UTI, decreased oral intake and concomitant use of ARB. Renal ultrasound does not show any obstructive pathology.   UA with only 30 mg proteinuria but UP/C is 6 g. SPEP with 0.6 M-spike, kappa 49.9, lambda 12225.   Status post kidney biopsy on 2/5.  Patient has a good urine output and serum creatinine level trending down to 3.1.  Continue IV fluid. Continue to monitor.  No need for dialysis.    # Hypercalcemia due to paraproteinemia. PTH 24, Vit D 20.  Bone survey with multiple lytic lesions consistent with multiple myeloma.  Calcium level elevated.  Ordered 2 doses of calcitonin on 2/5, calcium level improving.  Monitor. -Seen by oncologist, plan for bone marrow biopsy.  # Hypertension: Monitor blood pressure.  Continue hydralazine.  Off of ARB.  DC IV fluid.  # Anemia: Iron saturation 42%.  Evaluation for paraproteinemia ongoing.  Monitor hemoglobin.  # Recurrent urinary tract infection: On ceftriaxone.  Subjective: Seen and examined at bedside.  Kidney biopsy yesterday.  Urine output is around 2000 cc.  Denies nausea vomiting chest pain shortness of breath.   Objective Vital signs in last 24 hours: Vitals:   01/26/20 1331 01/26/20 1356 01/26/20 2034 01/27/20 0544  BP: (!) 175/78 (!) 176/76 (!) 141/60 (!) 177/67  Pulse:  60 68 72  Resp:  '19 16 18  '$ Temp:  98.9 F (37.2 C) 99.2 F (37.3 C) 98.8 F (37.1 C)  TempSrc:  Oral Oral Oral  SpO2:  99% 95% 95%  Weight:    74.9 kg  Height:       Weight change: 0.334 kg  Intake/Output Summary (Last 24 hours) at 01/27/2020 0841 Last data filed at 01/27/2020  0600 Gross per 24 hour  Intake 570.06 ml  Output 1950 ml  Net -1379.94 ml       Labs: Basic Metabolic Panel: Recent Labs  Lab 01/24/20 0523 01/24/20 0523 01/25/20 0331 01/26/20 0640 01/27/20 0513  NA 139   < > 137 139 139  K 3.7   < > 3.3* 4.0 4.0  CL 102   < > 102 108 106  CO2 26   < > 24 21* 21*  GLUCOSE 100*   < > 99 95 108*  BUN 49*   < > 44* 35* 30*  CREATININE 4.77*   < > 3.94* 3.35* 3.11*  CALCIUM 11.0*   < > 10.8* 11.1* 10.3  PHOS 5.1*  --  4.5  --   --    < > = values in this interval not displayed.   Liver Function Tests: Recent Labs  Lab 01/24/20 0523 01/25/20 0331  ALBUMIN 2.9* 2.9*   No results for input(s): LIPASE, AMYLASE in the last 168 hours. No results for input(s): AMMONIA in the last 168 hours. CBC: Recent Labs  Lab 01/22/20 1613 01/22/20 1613 01/23/20 1323 01/23/20 1323 01/24/20 0523 01/24/20 1930 01/25/20 0331 01/26/20 0640 01/27/20 0513  WBC 15.1*   < > 14.6*   < > 7.9  --  8.8 8.7  --   HGB 8.5 Repeated and verified X2.*   < > 8.1*   < >  6.7*   < > 9.6* 9.7* 9.7*  HCT 26.0*   < > 25.3*   < > 20.9*   < > 29.4* 30.4* 29.8*  MCV 86.1  --  88.5  --  88.9  --  84.2 84.0  --   PLT 184.0   < > 164   < > 138*  --  129* 123*  --    < > = values in this interval not displayed.   Cardiac Enzymes: Recent Labs  Lab 01/23/20 1850  CKTOTAL 91   CBG: No results for input(s): GLUCAP in the last 168 hours.  Iron Studies:  No results for input(s): IRON, TIBC, TRANSFERRIN, FERRITIN in the last 72 hours. Studies/Results: DG Bone Survey Met  Result Date: 01/25/2020 CLINICAL DATA:  Low back pain.  Chronic kidney disease stage 3. EXAM: METASTATIC BONE SURVEY COMPARISON:  None. FINDINGS: Multiple small rounded lucencies are noted in the visualized skull which may represent venous lakes, but lytic lesions cannot be excluded. There is noted a lytic destructive lesion seen involving the proximal right fibular shaft concerning for metastatic disease  or malignancy. No other focal abnormality is noted. IMPRESSION: Multiple small rounded lucencies are noted in the visualized skull which may represent venous lakes, but lytic lesions related to multiple myeloma metastatic disease cannot be excluded. Also noted is lytic destructive lesion seen involving the proximal right fibular shaft consistent with metastatic disease or malignancy. Electronically Signed   By: Marijo Conception M.D.   On: 01/25/2020 11:27   US BIOPSY (KIDNEY)  Result Date: 01/26/2020 INDICATION: 84 year old female with acute on chronic kidney disease concerning for possible amyloidosis or multiple myeloma. EXAM: ULTRASOUND GUIDED RENAL BIOPSY COMPARISON:  None. MEDICATIONS: Fentanyl 50 mcg IV; Versed 1 mg IV ANESTHESIA/SEDATION: The patient's vital signs and level of consciousness were monitored continuously by radiology nursing under my direct supervision. Total Moderate Sedation time Thirteen minutes COMPLICATIONS: None immediate PROCEDURE: Informed written consent was obtained from the patient after a discussion of the risks, benefits and alternatives to treatment. The patient understands and consents the procedure. A timeout was performed prior to the initiation of the procedure. Ultrasound scanning was performed of the bilateral flanks. The inferior pole of the left kidney was selected for biopsy due to location and sonographic window. The procedure was planned. The operative site was prepped and draped in the usual sterile fashion. The overlying soft tissues were anesthetized with 1% lidocaine with epinephrine. A 17 gauge core needle biopsy device was advanced into the inferior cortex of the left kidney and 3 core biopsies were obtained under direct ultrasound guidance. Images were saved for documentation purposes. The biopsy device was removed and hemostasis was obtained with manual compression. Post procedural scanning was negative for significant post procedural hemorrhage or additional  complication. A dressing was placed. The patient tolerated the procedure well without immediate post procedural complication. IMPRESSION: Technically successful ultrasound guided left renal biopsy. Electronically Signed   By: Jacqulynn Cadet M.D.   On: 01/26/2020 15:27    Medications: Infusions: . sodium chloride 75 mL/hr at 01/27/20 0124  . cefTRIAXone (ROCEPHIN)  IV 1 g (01/26/20 2128)    Scheduled Medications: . heparin  5,000 Units Subcutaneous Q12H  . hydrALAZINE  100 mg Oral TID  . levothyroxine  88 mcg Oral Q0600  . rosuvastatin  10 mg Oral QHS    have reviewed scheduled and prn medications.  Physical Exam: General:NAD, comfortable Heart:RRR, s1s2 nl Lungs:clear b/l, no crackle Abdomen:soft, Non-tender, non-distended  Extremities:No edema Neurology: Alert, awake, following commands.  Fawaz Borquez Prasad Zamyra Allensworth 01/27/2020,8:41 AM  LOS: 4 days  Pager: [phone]

## 2020-01-28 LAB — RENAL FUNCTION PANEL
Albumin: 3.1 g/dL — ABNORMAL LOW (ref 3.5–5.0)
Anion gap: 11 (ref 5–15)
BUN: 30 mg/dL — ABNORMAL HIGH (ref 8–23)
CO2: 20 mmol/L — ABNORMAL LOW (ref 22–32)
Calcium: 11 mg/dL — ABNORMAL HIGH (ref 8.9–10.3)
Chloride: 108 mmol/L (ref 98–111)
Creatinine, Ser: 3.15 mg/dL — ABNORMAL HIGH (ref 0.44–1.00)
GFR calc Af Amer: 15 mL/min — ABNORMAL LOW (ref >60)
GFR calc non Af Amer: 13 mL/min — ABNORMAL LOW (ref >60)
Glucose, Bld: 115 mg/dL — ABNORMAL HIGH (ref 70–99)
Phosphorus: 4.4 mg/dL (ref 2.5–4.6)
Potassium: 4.2 mmol/L (ref 3.5–5.1)
Sodium: 139 mmol/L (ref 135–145)

## 2020-01-28 LAB — PROTEIN, URINE, 24 HOUR
Collection Interval-UPROT: 24 hours
Protein, 24H Urine: 5090 mg/d — ABNORMAL HIGH (ref 50–100)
Protein, Urine: 509 mg/dL
Urine Total Volume-UPROT: 1000 mL

## 2020-01-28 LAB — HEMOGLOBIN AND HEMATOCRIT, BLOOD
HCT: 27.6 % — ABNORMAL LOW (ref 36.0–46.0)
Hemoglobin: 8.8 g/dL — ABNORMAL LOW (ref 12.0–15.0)

## 2020-01-28 MED ORDER — DOCUSATE SODIUM 100 MG PO CAPS
100.0000 mg | ORAL_CAPSULE | Freq: Every day | ORAL | Status: DC | PRN
  Administered 2020-01-28: 100 mg via ORAL
  Filled 2020-01-28: qty 1

## 2020-01-28 MED ORDER — CALCITONIN (SALMON) 200 UNIT/ML IJ SOLN
300.0000 [IU] | Freq: Two times a day (BID) | INTRAMUSCULAR | Status: AC
  Administered 2020-01-28 (×2): 300 [IU] via INTRAMUSCULAR
  Filled 2020-01-28 (×2): qty 1.5

## 2020-01-28 NOTE — Progress Notes (Signed)
[facility] PROGRESS NOTE  Assessment/ Plan: Pt is a 83 y.o. yo female with hypertension, gout, CKD stage III presented with worsening right hip pain and acute worsening of kidney disease.  # Acute kidney injury on chronic kidney disease stage III: Baseline Cr appears to be 1.7-1.9.    AKI likely likely due to lambda chain myeloma.  Patient also has UTI, decreased oral intake and concomitant use of ARB. Renal ultrasound does not show any obstructive pathology.   UA with only 30 mg proteinuria but UP/C is 6 g. SPEP with 0.6 M-spike, kappa 49.9, lambda 12225.   Status post kidney biopsy on 2/5.  Patient has  good urine output and serum creatinine level  Stable at 3.1.  Off IV fluid. Continue to monitor.  No need for dialysis.   # Hypercalcemia due to paraproteinemia. PTH 24, Vit D 20.  Bone survey with multiple lytic lesions consistent with multiple myeloma.  Received calcitonin with improvement in calcium level however now calcium climbing up.  I will order another 2 doses of calcitonin. -Seen by oncologist, plan for bone marrow biopsy.  # Hypertension: Monitor blood pressure.  Continue hydralazine.  Off of ARB and IV fluid.  # Anemia: Iron saturation 42%.  Evaluation for paraproteinemia ongoing.  Monitor hemoglobin.  # Recurrent urinary tract infection: On ceftriaxone.  Subjective: Seen and examined at bedside.  No new event.  Urine output around 1000 cc.  Denies nausea vomiting chest pain shortness of breath. Objective Vital signs in last 24 hours: Vitals:   01/27/20 1426 01/27/20 2026 01/27/20 2300 01/28/20 0527  BP: (!) 147/71 (!) 157/46  (!) 157/57  Pulse: 70 68  65  Resp: 20 18  20  Temp: 98.7 F (37.1 C) 98.9 F (37.2 C)  98.6 F (37 C)  TempSrc: Oral Oral  Oral  SpO2: 99% 95%  97%  Weight:   74.7 kg   Height:       Weight change: -0.272 kg  Intake/Output Summary (Last 24 hours) at 01/28/2020 0908 Last data filed at 01/28/2020 0600 Gross  per 24 hour  Intake 900 ml  Output 1027 ml  Net -127 ml       Labs: Basic Metabolic Panel: Recent Labs  Lab 01/24/20 0523 01/24/20 0523 01/25/20 0331 01/25/20 0331 01/26/20 0640 01/27/20 0513 01/28/20 0610  NA 139   < > 137   < > 139 139 139  K 3.7   < > 3.3*   < > 4.0 4.0 4.2  CL 102   < > 102   < > 108 106 108  CO2 26   < > 24   < > 21* 21* 20*  GLUCOSE 100*   < > 99   < > 95 108* 115*  BUN 49*   < > 44*   < > 35* 30* 30*  CREATININE 4.77*   < > 3.94*   < > 3.35* 3.11* 3.15*  CALCIUM 11.0*   < > 10.8*   < > 11.1* 10.3 11.0*  PHOS 5.1*  --  4.5  --   --   --  4.4   < > = values in this interval not displayed.   Liver Function Tests: Recent Labs  Lab 01/24/20 0523 01/25/20 0331 01/28/20 0610  ALBUMIN 2.9* 2.9* 3.1*   No results for input(s): LIPASE, AMYLASE in the last 168 hours. No results for input(s): AMMONIA in the last 168 hours. CBC: Recent Labs  Lab 01/22/20 1613 01/22/20   1613 01/23/20 1323 01/23/20 1323 01/24/20 0523 01/24/20 1930 01/25/20 0331 01/25/20 0331 01/26/20 0640 01/27/20 0513 01/28/20 0610  WBC 15.1*   < > 14.6*   < > 7.9  --  8.8  --  8.7  --   --   HGB 8.5 Repeated and verified X2.*   < > 8.1*   < > 6.7*   < > 9.6*   < > 9.7* 9.7* 8.8*  HCT 26.0*   < > 25.3*   < > 20.9*   < > 29.4*   < > 30.4* 29.8* 27.6*  MCV 86.1  --  88.5  --  88.9  --  84.2  --  84.0  --   --   PLT 184.0   < > 164   < > 138*  --  129*  --  123*  --   --    < > = values in this interval not displayed.   Cardiac Enzymes: Recent Labs  Lab 01/23/20 1850  CKTOTAL 91   CBG: No results for input(s): GLUCAP in the last 168 hours.  Iron Studies:  No results for input(s): IRON, TIBC, TRANSFERRIN, FERRITIN in the last 72 hours. Studies/Results: US BIOPSY (KIDNEY)  Result Date: 01/26/2020 INDICATION: 84 year old female with acute on chronic kidney disease concerning for possible amyloidosis or multiple myeloma. EXAM: ULTRASOUND GUIDED RENAL BIOPSY COMPARISON:   None. MEDICATIONS: Fentanyl 50 mcg IV; Versed 1 mg IV ANESTHESIA/SEDATION: The patient's vital signs and level of consciousness were monitored continuously by radiology nursing under my direct supervision. Total Moderate Sedation time Thirteen minutes COMPLICATIONS: None immediate PROCEDURE: Informed written consent was obtained from the patient after a discussion of the risks, benefits and alternatives to treatment. The patient understands and consents the procedure. A timeout was performed prior to the initiation of the procedure. Ultrasound scanning was performed of the bilateral flanks. The inferior pole of the left kidney was selected for biopsy due to location and sonographic window. The procedure was planned. The operative site was prepped and draped in the usual sterile fashion. The overlying soft tissues were anesthetized with 1% lidocaine with epinephrine. A 17 gauge core needle biopsy device was advanced into the inferior cortex of the left kidney and 3 core biopsies were obtained under direct ultrasound guidance. Images were saved for documentation purposes. The biopsy device was removed and hemostasis was obtained with manual compression. Post procedural scanning was negative for significant post procedural hemorrhage or additional complication. A dressing was placed. The patient tolerated the procedure well without immediate post procedural complication. IMPRESSION: Technically successful ultrasound guided left renal biopsy. Electronically Signed   By: Jacqulynn Cadet M.D.   On: 01/26/2020 15:27    Medications: Infusions:   Scheduled Medications: . heparin  5,000 Units Subcutaneous Q8H  . hydrALAZINE  100 mg Oral TID  . levothyroxine  88 mcg Oral Q0600  . rosuvastatin  10 mg Oral QHS    have reviewed scheduled and prn medications.  Physical Exam: General:NAD, comfortable, able to lie flat Heart:RRR, s1s2 nl Lungs:clear b/l, no crackle Abdomen:soft, Non-tender,  non-distended Extremities:No edema Neurology: Alert, awake, following commands.  [facility]yn Myers Carolyn Myers 01/28/2020,9:08 AM  LOS: 5 days  Pager: [phone]

## 2020-01-28 NOTE — Progress Notes (Signed)
PROGRESS NOTE    Carolyn Myers  DEY:[id number] DOB: 07/13/1936 DOA: 01/23/2020 PCP: Darreld Mclean, MD   Brief Narrative:  HPI on 01/23/2020 by Dr. Wynetta Fines Carolyn Myers is a 84 y.o. female with medical history significant of CKD stage III, bilateral renal artery stenosis, chronic right hip pain, hypertension, gout, thyroidism, presented with worsening of right hip pain, and AKI.  Patient was recently treated for E. coli UTI, by her PCP on 01/17/2020 with Augmentin.  Before that she was evaluated by orthopedic surgery for worsening of her right hip pain and was placed on Tylenol 3, and image study showed no significant right hip pathology but some lumbar vertebral arthritis.  He went to see her PCP yesterday and blood work showed significant elevation of creatinine level from her baseline 1.92 to 5.3.  She denied taking any NSAIDs on a regular basis. Regarding the right hip pain, she said the nature has been aching and sometimes shooting down to the back side of her right leg, she has been using a walker for ambulation.  Interim history Admitted for acute kidney injury, nephrology consulted and appreciated.  Patient noted to have UTI and has completed Augmentin therapy prior to admission.  Only complaining of right hip and back pain.  Patient noted to have a hemoglobin of 6.7 this morning, 2 units of PRBC have been transfused. Pending renal biopsy results and bone marrow biopsy. Assessment & Plan   Acute kidney injury on chronic kidney disease, stage IIIb -Possibly secondary to urinary tract infection, decreased oral intake, with ARB use, possible AIN from Augmentin use versus hypercalcemia -Creatinine on admission 5.18 -Renal ultrasound shows no obstructive pathology -Creatinine currently down to 3.15 -Nephrology consulted and appreciated, pending renal biopsy results -Currently on IVF  Anemia of chronic disease -Hemoglobin was down to 6.7, 2 units PRBC transfused -hemoglobin  currently 9.7 -FOBT pending -Anemia panel shows adequate iron and storage -Continue to monitor CBC  Recurrent UTI -Patient recently completed Augmentin -UA showed few bacteria, 11-20 WBC, large leukocytes, negative nitrites -Urine culture showed no growth -Has been placed on ceftriaxone- discontinued  Hypercalcemia -PTH 24 (WNL) and pending PTHrP -kappa free chain 49.9; lamda free light chains 12252.6 -Mspike 0.6 -25-hydroxy vitamin D 20.67 (low) -?MM -patient given calcitonin on 2/5- calcium trending downward  Right hip pain/ back pain -?if due to the above vs MM -Bone survey: Multiple small rounded lucencies are noted in the visualized skull which may represent venous lakes, but lytic lesions related to multiple myeloma metastatic disease cannot be excluded.  Lytic destructive lesion seen in the proximal right fibular shaft consistent with metastatic disease or malignancy -PT consulted and recommended SNF -oncology consulted and appreciated, suspect will need bone marrow biopsy  Gout -uric acid 17.1  Essential Hypertension -ARB held acute kidney injury -Continue hydralazine  Hypothyroidism -Continue Synthroid  Hypokalemia -resolved with repletion -monitor BMP  DVT Prophylaxis Heparin  Code Status: DNR  Family Communication: None at bedside  Disposition Plan: Admitted.  Patient from home, presented with hip pain.  Found to have worsening creatinine on chronic kidney disease, stage III.  Nephrology consulted and appreciated, currently on IV fluids, pending renal biopsy results and possible bone marrow biopsy. Disposition possible SNF.  Consultants Nephrology Interventional radiology Oncology  Procedures  Renal ultrasound Left renal biopsy   Antibiotics   Anti-infectives (From admission, onward)   Start     Dose/Rate Route Frequency Ordered Stop   01/23/20 1930  cefTRIAXone (ROCEPHIN) 1 g in  sodium chloride 0.9 % 100 mL IVPB     1 g 200 mL/hr over 30  Minutes Intravenous Every 24 hours 01/23/20 1919 01/27/20 1911      Subjective:   Carolyn Myers seen and examined today.  No complaints today.  Denies chest pain, shortness of breath, abdominal pain, nausea or vomiting, diarrhea or constipation, dizziness or headache.  Has occasional back pain and hip pain. Objective:   Vitals:   01/27/20 2026 01/27/20 2300 01/28/20 0527 01/28/20 1206  BP: (!) 157/46  (!) 157/57 (!) 146/60  Pulse: 68  65 64  Resp: '18  20 20  '$ Temp: 98.9 F (37.2 C)  98.6 F (37 C) (!) 97.3 F (36.3 C)  TempSrc: Oral  Oral Oral  SpO2: 95%  97% 98%  Weight:  74.7 kg    Height:        Intake/Output Summary (Last 24 hours) at 01/28/2020 1217 Last data filed at 01/28/2020 0900 Gross per 24 hour  Intake 1020 ml  Output 1027 ml  Net -7 ml   Filed Weights   01/26/20 0446 01/27/20 0544 01/27/20 2300  Weight: 74.6 kg 74.9 kg 74.7 kg   Exam  General: Well developed, well nourished, NAD, appears stated age  47: NCAT,mucous membranes moist.   Cardiovascular: S1 S2 auscultated, RRR  Respiratory: Clear to auscultation bilaterally   Abdomen: Soft, nontender, nondistended, + bowel sounds  Extremities: warm dry without cyanosis clubbing or edema  Neuro: AAOx3, nonfocal  Psych: Appropriate mood and affect  Data Reviewed: I have personally reviewed following labs and imaging studies  CBC: Recent Labs  Lab 01/22/20 1613 01/22/20 1613 01/23/20 1323 01/23/20 1323 01/24/20 0523 01/24/20 0523 01/24/20 1930 01/25/20 0331 01/26/20 0640 01/27/20 0513 01/28/20 0610  WBC 15.1*  --  14.6*  --  7.9  --   --  8.8 8.7  --   --   HGB 8.5 Repeated and verified X2.*   < > 8.1*   < > 6.7*   < > 9.6* 9.6* 9.7* 9.7* 8.8*  HCT 26.0*   < > 25.3*   < > 20.9*   < > 29.3* 29.4* 30.4* 29.8* 27.6*  MCV 86.1  --  88.5  --  88.9  --   --  84.2 84.0  --   --   PLT 184.0  --  164  --  138*  --   --  129* 123*  --   --    < > = values in this interval not displayed.   Basic  Metabolic Panel: Recent Labs  Lab 01/24/20 0523 01/25/20 0331 01/26/20 0640 01/27/20 0513 01/28/20 0610  NA 139 137 139 139 139  K 3.7 3.3* 4.0 4.0 4.2  CL 102 102 108 106 108  CO2 26 24 21* 21* 20*  GLUCOSE 100* 99 95 108* 115*  BUN 49* 44* 35* 30* 30*  CREATININE 4.77* 3.94* 3.35* 3.11* 3.15*  CALCIUM 11.0* 10.8* 11.1* 10.3 11.0*  MG  --  2.2  --   --   --   PHOS 5.1* 4.5  --   --  4.4   GFR: Estimated Creatinine Clearance: 13.6 mL/min (A) (by C-G formula based on SCr of 3.15 mg/dL (H)). Liver Function Tests: Recent Labs  Lab 01/24/20 0523 01/25/20 0331 01/28/20 0610  ALBUMIN 2.9* 2.9* 3.1*   No results for input(s): LIPASE, AMYLASE in the last 168 hours. No results for input(s): AMMONIA in the last 168 hours. Coagulation Profile: Recent Labs  Lab 01/26/20 0640  INR 1.0   Cardiac Enzymes: Recent Labs  Lab 01/23/20 1850  CKTOTAL 91   BNP (last 3 results) No results for input(s): PROBNP in the last 8760 hours. HbA1C: No results for input(s): HGBA1C in the last 72 hours. CBG: No results for input(s): GLUCAP in the last 168 hours. Lipid Profile: No results for input(s): CHOL, HDL, LDLCALC, TRIG, CHOLHDL, LDLDIRECT in the last 72 hours. Thyroid Function Tests: No results for input(s): TSH, T4TOTAL, FREET4, T3FREE, THYROIDAB in the last 72 hours. Anemia Panel: No results for input(s): VITAMINB12, FOLATE, FERRITIN, TIBC, IRON, RETICCTPCT in the last 72 hours. Urine analysis:    Component Value Date/Time   COLORURINE YELLOW 01/23/2020 1855   APPEARANCEUR CLOUDY (A) 01/23/2020 1855   LABSPEC 1.012 01/23/2020 1855   PHURINE 8.0 01/23/2020 1855   GLUCOSEU NEGATIVE 01/23/2020 1855   HGBUR NEGATIVE 01/23/2020 1855   BILIRUBINUR NEGATIVE 01/23/2020 1855   BILIRUBINUR negative 01/17/2020 1535   KETONESUR NEGATIVE 01/23/2020 1855   PROTEINUR 30 (A) 01/23/2020 1855   UROBILINOGEN 0.2 01/17/2020 1535   NITRITE NEGATIVE 01/23/2020 1855   LEUKOCYTESUR LARGE (A)  01/23/2020 1855   Sepsis Labs: '@LABRCNTIP'$ (procalcitonin:4,lacticidven:4)  ) Recent Results (from the past 240 hour(s))  SARS CORONAVIRUS 2 (TAT 6-24 HRS) Nasopharyngeal Nasopharyngeal Swab     Status: None   Collection Time: 01/23/20  3:06 PM   Specimen: Nasopharyngeal Swab  Result Value Ref Range Status   SARS Coronavirus 2 NEGATIVE NEGATIVE Final    Comment: (NOTE) SARS-CoV-2 target nucleic acids are NOT DETECTED. The SARS-CoV-2 RNA is generally detectable in upper and lower respiratory specimens during the acute phase of infection. Negative results do not preclude SARS-CoV-2 infection, do not rule out co-infections with other pathogens, and should not be used as the sole basis for treatment or other patient management decisions. Negative results must be combined with clinical observations, patient history, and epidemiological information. The expected result is Negative. Fact Sheet for Patients: SugarRoll.be Fact Sheet for Healthcare Providers: https://www.woods-mathews.com/ This test is not yet approved or cleared by the [place] FDA and  has been authorized for detection and/or diagnosis of SARS-CoV-2 by FDA under an Emergency Use Authorization (EUA). This EUA will remain  in effect (meaning this test can be used) for the duration of the COVID-19 declaration under Section 56 4(b)(1) of the Act, 21 U.S.C. section 360bbb-3(b)(1), unless the authorization is terminated or revoked sooner. Performed at [facility], [place] [address] [place], [place] [address]   Culture, Urine     Status: None   Collection Time: 01/23/20  6:54 PM   Specimen: Urine, Clean Catch  Result Value Ref Range Status   Specimen Description URINE, CLEAN CATCH  Final   Special Requests Normal  Final   Culture   Final    NO GROWTH Performed at [facility], [place] [address] [place],  [address]    Report Status 01/24/2020 FINAL  Final       Radiology Studies: US BIOPSY (KIDNEY)  Result Date: 01/26/2020 INDICATION: 84 year old female with acute on chronic kidney disease concerning for possible amyloidosis or multiple myeloma. EXAM: ULTRASOUND GUIDED RENAL BIOPSY COMPARISON:  None. MEDICATIONS: Fentanyl 50 mcg IV; Versed 1 mg IV ANESTHESIA/SEDATION: The patient's vital signs and level of consciousness were monitored continuously by radiology nursing under my direct supervision. Total Moderate Sedation time Thirteen minutes COMPLICATIONS: None immediate PROCEDURE: Informed written consent was obtained from the patient after a discussion of the risks, benefits  and alternatives to treatment. The patient understands and consents the procedure. A timeout was performed prior to the initiation of the procedure. Ultrasound scanning was performed of the bilateral flanks. The inferior pole of the left kidney was selected for biopsy due to location and sonographic window. The procedure was planned. The operative site was prepped and draped in the usual sterile fashion. The overlying soft tissues were anesthetized with 1% lidocaine with epinephrine. A 17 gauge core needle biopsy device was advanced into the inferior cortex of the left kidney and 3 core biopsies were obtained under direct ultrasound guidance. Images were saved for documentation purposes. The biopsy device was removed and hemostasis was obtained with manual compression. Post procedural scanning was negative for significant post procedural hemorrhage or additional complication. A dressing was placed. The patient tolerated the procedure well without immediate post procedural complication. IMPRESSION: Technically successful ultrasound guided left renal biopsy. Electronically Signed   By: Jacqulynn Cadet M.D.   On: 01/26/2020 15:27     Scheduled Meds: . calcitonin  300 Units Intramuscular BID  . heparin  5,000 Units Subcutaneous Q8H  . hydrALAZINE  100 mg Oral TID  . levothyroxine  88  mcg Oral Q0600  . rosuvastatin  10 mg Oral QHS   Continuous Infusions:    LOS: 5 days   Time Spent in minutes   30 minutes  Cheridan Kibler D.O. on 01/28/2020 at 12:17 PM  Between 7am to 7pm - Please see pager noted on [email or web address]  After 7pm go to [email or web address]  And look for the night coverage person covering for me after hours  [facility] Office  [phone]

## 2020-01-28 NOTE — TOC Progression Note (Signed)
Transition of Care [facility]) - Progression Note    Patient Details  Name: Carolyn Myers MRN: [id number] Date of Birth: 1936/08/26  Transition of Care [facility]) CM/SW [place], LCSW Phone Number: [phone] 01/28/2020, 12:24 PM  Clinical Narrative:     CSW followed up RN about disposition of patient and was informed that patient will be receiving another CT scan tomorrow therefore will not be ready for discharge today. CSW followed up with Otila Kluver at facility and was informed that patient will need another COVID test prior to admission. Patient's last COVID test was on 01/25/20. Patient's authorization will expire tomorrow therefore it will need to be renewed.  TOC team will continue to follow for discharge planning needs.   Expected Discharge Plan: [place] Barriers to Discharge: Continued Medical Work up  Expected Discharge Plan and Services Expected Discharge Plan: [place] Choice: [place] arrangements for the past 2 months: Single Family Home                                       Social Determinants of Health (SDOH) Interventions    Readmission Risk Interventions No flowsheet data found.

## 2020-01-29 LAB — HEMOGLOBIN AND HEMATOCRIT, BLOOD
HCT: 27 % — ABNORMAL LOW (ref 36.0–46.0)
Hemoglobin: 8.8 g/dL — ABNORMAL LOW (ref 12.0–15.0)

## 2020-01-29 LAB — RENAL FUNCTION PANEL
Albumin: 3 g/dL — ABNORMAL LOW (ref 3.5–5.0)
Anion gap: 12 (ref 5–15)
BUN: 29 mg/dL — ABNORMAL HIGH (ref 8–23)
CO2: 20 mmol/L — ABNORMAL LOW (ref 22–32)
Calcium: 10.7 mg/dL — ABNORMAL HIGH (ref 8.9–10.3)
Chloride: 107 mmol/L (ref 98–111)
Creatinine, Ser: 3.1 mg/dL — ABNORMAL HIGH (ref 0.44–1.00)
GFR calc Af Amer: 16 mL/min — ABNORMAL LOW (ref >60)
GFR calc non Af Amer: 13 mL/min — ABNORMAL LOW (ref >60)
Glucose, Bld: 100 mg/dL — ABNORMAL HIGH (ref 70–99)
Phosphorus: 4.6 mg/dL (ref 2.5–4.6)
Potassium: 4.1 mmol/L (ref 3.5–5.1)
Sodium: 139 mmol/L (ref 135–145)

## 2020-01-29 MED ORDER — HYDROMORPHONE HCL 1 MG/ML IJ SOLN
0.5000 mg | INTRAMUSCULAR | Status: DC | PRN
  Administered 2020-01-29 – 2020-02-01 (×7): 0.5 mg via INTRAVENOUS
  Filled 2020-01-29 (×7): qty 0.5

## 2020-01-29 MED ORDER — NIFEDIPINE ER OSMOTIC RELEASE 30 MG PO TB24
30.0000 mg | ORAL_TABLET | Freq: Every day | ORAL | Status: DC
  Administered 2020-01-29 – 2020-01-31 (×3): 30 mg via ORAL
  Filled 2020-01-29 (×4): qty 1

## 2020-01-29 NOTE — Progress Notes (Signed)
Visited Carolyn Myers per consult. Mrs. Andra wanted prayer and conversation. After prayer she felt more at ease. Will continue to be available for spiritual care as needed.  Rev. [place].

## 2020-01-29 NOTE — Progress Notes (Signed)
Brief Oncology note:  Chart has been reviewed. Patient scheduled for bone marrow biopsy on 01/30/2020 by IR. UPEP, IFE, and QIG pending. Will plan to follow up on results once available. The patient plans to go to SNF after discharge. She tells me that she will likely go to a facility in [place], [place]. We can arrange for follow up with [facility] oncology if she is in [place]. If she stays in the [place] area, we will arrange for follow up at the [facility].  Mikey Bussing, DNP, AGPCNP-BC, AOCNP

## 2020-01-29 NOTE — Consult Note (Signed)
Chief Complaint: Patient was seen in consultation today for bone marrow biopsy Chief Complaint  Patient presents with  . Acute Renal Failure   at the request of Dr Wardell Honour  Referring Physician(s): Bland Span, K NP  Supervising Physician: Sandi Mariscal  Patient Status: [facility] - In-pt  History of Present Illness: Carolyn Myers is a 84 y.o. female   To ED with worsening bone pain Acute on chronic renal disease Hypercalcemia and anemia  DR Maylon Peppers note 2/5: Free lambda light chain was > 12,000 and skeletal survey showed several suspicious bony lesions. Given these findings, this is very suspicious for lambda light chain myeloma.  Patient underwent kidney biopsy on 01/26/2020, results pending.  Oncology was consulted for further evaluation.  I discussed the pathophysiology, diagnosis and treatment options for multiple myeloma.  She will need a bone marrow biopsy to confirm clonal plasma cell population, as well as baseline PET to assess the extent of metastatic disease.  Request for Bone marrow biopsy per Oncology Plan for tomorrow 02/09/20    Past Medical History:  Diagnosis Date  . Arthritis    "minor in my shoulders" (02/21/2016)  . Chicken pox   . Diverticulitis   . Frequent headaches    "maybe twice/week" (03/12/2016)  . Gout    Right Foot  . History of blood transfusion    Reported transfusion following hysterectomy  . History of gout   . Hyperlipidemia   . Hypertension   . Hypothyroidism   . Migraine    "maybe twice/year" (02/21/2016)  . Palpitations    "doctor thought it was related to my thyroid"  . Sleep apnea    "I don't wear my mask" (02/21/2016)    Past Surgical History:  Procedure Laterality Date  . ABDOMINAL HYSTERECTOMY     "for fibroid tumors"  . TUBAL LIGATION      Allergies: Pineapple  Medications: Prior to Admission medications   Medication Sig Start Date End Date Taking? Authorizing Provider  acetaminophen-codeine (TYLENOL #3) 300-30 MG tablet  Take 0.5-1 tablets by mouth every 8 (eight) hours as needed for moderate pain. 12/28/19  Yes Copland, Gay Filler, MD  aspirin EC 81 MG EC tablet Take 1 tablet (81 mg total) by mouth daily. 02/22/16  Yes Brett Canales, PA-C  Carboxymethylcellulose Sodium ([employer] LUBRICANT EYE DROPS PF OP) Place 1-2 drops into both eyes 3 (three) times daily as needed (for dry eyes).   Yes [provider]  diclofenac Sodium (VOLTAREN) 1 % GEL Apply 2 g topically 4 (four) times daily. Use as needed for painful joints.  Max 32 grams per day Patient taking differently: Apply 2 g topically 4 (four) times daily as needed (to affected sites/painful joints (right leg/hip) ("MAX 32 GRAMS/DAY")).  11/22/19  Yes Copland, Gay Filler, MD  hydrALAZINE (APRESOLINE) 100 MG tablet TAKE 1 TABLET BY MOUTH 3 TIMES DAILY. MONITOR YOUR BLOOD PRESSURE, HOLD FOR BP 100/55 AND PULSE 55 Patient taking differently: Take 100 mg by mouth See admin instructions. Take 100 mg by mouth three times a day and MONITOR YOUR BLOOD PRESSURE- HOLD FOR B/P 100/55 AND PULSE 55 11/06/19  Yes Copland, Gay Filler, MD  levothyroxine (SYNTHROID) 88 MCG tablet TAKE 1 TABLET BY MOUTH DAILY BEFORE BREAKFAST. Patient taking differently: Take 88 mcg by mouth daily before breakfast.  01/17/20  Yes Copland, Gay Filler, MD  rosuvastatin (CRESTOR) 10 MG tablet TAKE 1 TABLET BY MOUTH EVERY DAY Patient taking differently: Take 10 mg by mouth at bedtime.  11/06/19  Yes Copland, Gay Filler, MD  amLODipine (NORVASC) 10 MG tablet Take 1 tablet (10 mg total) by mouth daily. Patient not taking: Reported on 01/23/2020 05/04/18   Copland, Gay Filler, MD  losartan (COZAAR) 50 MG tablet Take 1 tablet (50 mg total) by mouth daily. Patient not taking: Reported on 01/23/2020 05/04/18   Copland, Gay Filler, MD  meclizine (ANTIVERT) 12.5 MG tablet Take 1 tablet (12.5 mg total) by mouth 3 (three) times daily as needed for dizziness. Patient not taking: Reported on 01/23/2020 08/03/19   Copland, Gay Filler,  MD  nitroGLYCERIN (NITROSTAT) 0.4 MG SL tablet Place 1 tablet (0.4 mg total) under the tongue every 5 (five) minutes x 3 doses as needed for chest pain. 11/05/16   Belva Crome, MD  ondansetron (ZOFRAN) 4 MG tablet Take 1 tablet (4 mg total) by mouth every 8 (eight) hours as needed for nausea or vomiting. Patient not taking: Reported on 01/23/2020 01/17/20   Copland, Gay Filler, MD  ranitidine (ZANTAC) 300 MG tablet Take 0.5 tablets (150 mg total) by mouth 2 (two) times daily. Patient not taking: Reported on 01/23/2020 05/04/18   Copland, Gay Filler, MD     Family History  Problem Relation Age of Onset  . Heart disease Mother   . Heart attack Mother        died from it  . Hypertension Mother   . Arthritis Mother   . Stroke Father   . Cancer Maternal Grandmother   . Heart attack Maternal Aunt   . Renal Disease Maternal Aunt   . Multiple myeloma Maternal Uncle   . Emphysema Maternal Uncle   . Heart disease Sister   . Thyroid disease Sister   . Healthy Son        x3  . Healthy Daughter        x5  . Sickle cell trait Grandchild     Social History   Socioeconomic History  . Marital status: Widowed    Spouse name: Not on file  . Number of children: 8  . Years of education: Not on file  . Highest education level: Not on file  Occupational History  . Occupation: Retired  Tobacco Use  . Smoking status: Never Smoker  . Smokeless tobacco: Never Used  Substance and Sexual Activity  . Alcohol use: No  . Drug use: No  . Sexual activity: Never  Other Topics Concern  . Not on file  Social History Narrative   Lives alone, family helps in her care.   Social Determinants of Health   Financial Resource Strain:   . Difficulty of Paying Living Expenses: Not on file  Food Insecurity:   . Worried About Charity fundraiser in the Last Year: Not on file  . Ran Out of Food in the Last Year: Not on file  Transportation Needs:   . Lack of Transportation (Medical): Not on file  . Lack of  Transportation (Non-Medical): Not on file  Physical Activity:   . Days of Exercise per Week: Not on file  . Minutes of Exercise per Session: Not on file  Stress:   . Feeling of Stress : Not on file  Social Connections:   . Frequency of Communication with Friends and Family: Not on file  . Frequency of Social Gatherings with Friends and Family: Not on file  . Attends Religious Services: Not on file  . Active Member of Clubs or Organizations: Not on file  . Attends Club or  Organization Meetings: Not on file  . Marital Status: Not on file    Review of Systems: A 12 point ROS discussed and pertinent positives are indicated in the HPI above.  All other systems are negative.  Review of Systems  Constitutional: Positive for activity change and fatigue. Negative for unexpected weight change.  Respiratory: Negative for cough and shortness of breath.   Cardiovascular: Negative for chest pain.  Gastrointestinal: Positive for nausea. Negative for abdominal pain.  Musculoskeletal: Positive for arthralgias.  Neurological: Positive for weakness.  Psychiatric/Behavioral: Negative for behavioral problems and confusion.    Vital Signs: BP (!) 159/53   Pulse 63   Temp 98.1 F (36.7 C) (Oral)   Resp 16   Ht 5' 3" (1.6 m)   Wt 161 lb 9.6 oz (73.3 kg)   SpO2 98%   BMI 28.63 kg/m   Physical Exam Vitals reviewed.  Cardiovascular:     Rate and Rhythm: Normal rate and regular rhythm.     Heart sounds: Normal heart sounds.  Pulmonary:     Breath sounds: Normal breath sounds.  Abdominal:     Palpations: Abdomen is soft.  Musculoskeletal:        General: Normal range of motion.  Skin:    General: Skin is warm and dry.  Neurological:     Mental Status: She is alert and oriented to person, place, and time.  Psychiatric:        Behavior: Behavior normal.     Imaging: DG Chest 2 View  Result Date: 01/17/2020 CLINICAL DATA:  84 year old female with weakness and malaise for 1 week. Back  pain and urinary symptoms. EXAM: CHEST - 2 VIEW COMPARISON:  08/22/2018 chest radiographs and earlier. FINDINGS: AP and lateral views. Lung volumes and mediastinal contours are stable and within normal limits. Visualized tracheal air column is within normal limits. Both lungs appear clear. No pneumothorax or pleural effusion. No acute osseous abnormality identified. Negative visible bowel gas pattern. IMPRESSION: No acute cardiopulmonary abnormality. Electronically Signed   By: Genevie Ann M.D.   On: 01/17/2020 16:07   US Renal  Result Date: 01/23/2020 CLINICAL DATA:  Right-sided flank pain for 2 weeks EXAM: RENAL / URINARY TRACT ULTRASOUND COMPLETE COMPARISON:  02/21/2016 FINDINGS: Right Kidney: Renal measurements: 10.6 x 5.0 by 5.1 cm = volume: 141 mL. Diffuse increased renal cortical echotexture and decreased corticomedullary differentiation, compatible with medical renal disease. Multifocal areas of cortical thinning and scarring. Multiple small simple renal cysts measuring up to 1.5 cm. Left Kidney: Renal measurements: 9.5 x 5.9 x 5.0 cm = volume: 148 mL. Diffuse increased renal cortical echotexture and decreased corticomedullary differentiation, compatible with medical renal disease. Bladder: Appears normal for degree of bladder distention. Other: No evidence of urinary tract calculi or obstructive uropathy. IMPRESSION: 1. Increased renal cortical echotexture consistent with medical renal disease. 2. Chronic right renal cortical thinning and scarring, not appreciably changed since prior CT exam. 3. Small simple cysts right kidney. Electronically Signed   By: Carolyn Ngo M.D.   On: 01/23/2020 15:49   DG Bone Survey Met  Result Date: 01/25/2020 CLINICAL DATA:  Low back pain.  Chronic kidney disease stage 3. EXAM: METASTATIC BONE SURVEY COMPARISON:  None. FINDINGS: Multiple small rounded lucencies are noted in the visualized skull which may represent venous lakes, but lytic lesions cannot be excluded. There  is noted a lytic destructive lesion seen involving the proximal right fibular shaft concerning for metastatic disease or malignancy. No other focal abnormality is noted.  IMPRESSION: Multiple small rounded lucencies are noted in the visualized skull which may represent venous lakes, but lytic lesions related to multiple myeloma metastatic disease cannot be excluded. Also noted is lytic destructive lesion seen involving the proximal right fibular shaft consistent with metastatic disease or malignancy. Electronically Signed   By: Marijo Conception M.D.   On: 01/25/2020 11:27   US BIOPSY (KIDNEY)  Result Date: 01/26/2020 INDICATION: 84 year old female with acute on chronic kidney disease concerning for possible amyloidosis or multiple myeloma. EXAM: ULTRASOUND GUIDED RENAL BIOPSY COMPARISON:  None. MEDICATIONS: Fentanyl 50 mcg IV; Versed 1 mg IV ANESTHESIA/SEDATION: The patient's vital signs and level of consciousness were monitored continuously by radiology nursing under my direct supervision. Total Moderate Sedation time Thirteen minutes COMPLICATIONS: None immediate PROCEDURE: Informed written consent was obtained from the patient after a discussion of the risks, benefits and alternatives to treatment. The patient understands and consents the procedure. A timeout was performed prior to the initiation of the procedure. Ultrasound scanning was performed of the bilateral flanks. The inferior pole of the left kidney was selected for biopsy due to location and sonographic window. The procedure was planned. The operative site was prepped and draped in the usual sterile fashion. The overlying soft tissues were anesthetized with 1% lidocaine with epinephrine. A 17 gauge core needle biopsy device was advanced into the inferior cortex of the left kidney and 3 core biopsies were obtained under direct ultrasound guidance. Images were saved for documentation purposes. The biopsy device was removed and hemostasis was obtained  with manual compression. Post procedural scanning was negative for significant post procedural hemorrhage or additional complication. A dressing was placed. The patient tolerated the procedure well without immediate post procedural complication. IMPRESSION: Technically successful ultrasound guided left renal biopsy. Electronically Signed   By: Jacqulynn Cadet M.D.   On: 01/26/2020 15:27   DG HIP UNILAT W OR W/O PELVIS 2-3 VIEWS RIGHT  Result Date: 01/22/2020 CLINICAL DATA:  Right hip pain, no history of injury, pain for 3 weeks EXAM: DG HIP (WITH OR WITHOUT PELVIS) 2-3V RIGHT COMPARISON:  None. FINDINGS: Frontal view of the pelvis as well as frontal and frogleg lateral views of the right hip are obtained. No acute displaced fracture. Joint spaces are well preserved. There is prominent spondylosis within the lower lumbar spine greatest at L4-5 and L5-S1. Sacroiliac joints appear normal. IMPRESSION: 1. Unremarkable pelvis and right hip. 2. Lower lumbar spondylosis. Electronically Signed   By: Carolyn Ngo M.D.   On: 01/22/2020 16:37    Labs:  CBC: Recent Labs    01/23/20 1323 01/23/20 1323 01/24/20 0523 01/24/20 1930 01/25/20 0331 01/25/20 0331 01/26/20 0640 01/27/20 0513 01/28/20 0610 01/29/20 0321  WBC 14.6*  --  7.9  --  8.8  --  8.7  --   --   --   HGB 8.1*   < > 6.7*   < > 9.6*   < > 9.7* 9.7* 8.8* 8.8*  HCT 25.3*   < > 20.9*   < > 29.4*   < > 30.4* 29.8* 27.6* 27.0*  PLT 164  --  138*  --  129*  --  123*  --   --   --    < > = values in this interval not displayed.    COAGS: Recent Labs    01/26/20 0640  INR 1.0    BMP: Recent Labs    01/26/20 0640 01/27/20 0513 01/28/20 0610 01/29/20 0321  NA 139 139 139  139  K 4.0 4.0 4.2 4.1  CL 108 106 108 107  CO2 21* 21* 20* 20*  GLUCOSE 95 108* 115* 100*  BUN 35* 30* 30* 29*  CALCIUM 11.1* 10.3 11.0* 10.7*  CREATININE 3.35* 3.11* 3.15* 3.10*  GFRNONAA 12* 13* 13* 13*  GFRAA 14* 16* 15* 16*    LIVER FUNCTION  TESTS: Recent Labs    08/03/19 0956 08/03/19 0956 01/17/20 1533 01/17/20 1533 01/24/20 0523 01/25/20 0331 01/28/20 0610 01/29/20 0321  BILITOT 0.3  --  0.6  --   --   --   --   --   AST 12  --  24  --   --   --   --   --   ALT 6  --  6  --   --   --   --   --   ALKPHOS 73  --  80  --   --   --   --   --   PROT 7.2  --  7.4  --   --   --   --   --   ALBUMIN 4.3   < > 4.3   < > 2.9* 2.9* 3.1* 3.0*   < > = values in this interval not displayed.    TUMOR MARKERS: No results for input(s): AFPTM, CEA, CA199, CHROMGRNA in the last 8760 hours.  Assessment and Plan:  Bone pain Bone scan: IMPRESSION: Multiple small rounded lucencies are noted in the visualized skull which may represent venous lakes, but lytic lesions related to multiple myeloma metastatic disease cannot be excluded. Also noted is lytic destructive lesion seen involving the proximal right fibular shaft consistent with metastatic disease or malignancy.  Anemia  Hypercalcemia A/CKD  Scheduled for bone marrow biopsy tomorrow per Oncology request Risks and benefits of bone marrow biopsy was discussed with the patient and/or patient's family including, but not limited to bleeding, infection, damage to adjacent structures or low yield requiring additional tests.  All of the questions were answered and there is agreement to proceed. Consent signed and in chart.   Thank you for this interesting consult.  I greatly enjoyed meeting Axie A Brzozowski and look forward to participating in their care.  A copy of this report was sent to the requesting provider on this date.  Electronically Signed: Lavonia Drafts, PA-C 01/29/2020, 8:41 AM   I spent a total of 20 Minutes    in face to face in clinical consultation, greater than 50% of which was counseling/coordinating care for bone marrow bx

## 2020-01-29 NOTE — Progress Notes (Signed)
PT Cancellation Note  Patient Details Name: Carolyn Myers MRN: [id number] DOB: Jan 24, 1936   Cancelled Treatment:    Reason Eval/Treat Not Completed: Patient declined, no reason specified Patient declined participating in therapy at this time due to feeling dizzy. Pt requests PT return this pm. PT will continue to follow acutely and return as schedule permits.    Earney Navy, PTA Acute Rehabilitation Services Pager: [phone] Office: [phone]   01/29/2020, 11:38 AM

## 2020-01-29 NOTE — Plan of Care (Signed)
  Problem: Elimination: Goal: Will not experience complications related to bowel motility Outcome: Completed/Met Goal: Will not experience complications related to urinary retention Outcome: Completed/Met

## 2020-01-29 NOTE — Care Management Important Message (Signed)
Important Message  Patient Details  Name: JEDIDIAH NUTT MRN: [id number] Date of Birth: January 15, 1936   Medicare Important Message Given:  Yes     Shelda Altes 01/29/2020, 3:35 PM

## 2020-01-29 NOTE — Progress Notes (Signed)
PROGRESS NOTE    Carolyn Myers  KGU:[id number] DOB: 1936-05-06 DOA: 01/23/2020 PCP: Darreld Mclean, MD   Brief Narrative:  HPI on 01/23/2020 by Dr. Wynetta Fines Carolyn Myers is a 84 y.o. female with medical history significant of CKD stage III, bilateral renal artery stenosis, chronic right hip pain, hypertension, gout, thyroidism, presented with worsening of right hip pain, and AKI.  Patient was recently treated for E. coli UTI, by her PCP on 01/17/2020 with Augmentin.  Before that she was evaluated by orthopedic surgery for worsening of her right hip pain and was placed on Tylenol 3, and image study showed no significant right hip pathology but some lumbar vertebral arthritis.  He went to see her PCP yesterday and blood work showed significant elevation of creatinine level from her baseline 1.92 to 5.3.  She denied taking any NSAIDs on a regular basis. Regarding the right hip pain, she said the nature has been aching and sometimes shooting down to the back side of her right leg, she has been using a walker for ambulation.  Interim history Admitted for acute kidney injury, nephrology consulted and appreciated.  Patient noted to have UTI and has completed Augmentin therapy prior to admission.  Only complaining of right hip and back pain.  Patient noted to have a hemoglobin of 6.7 this morning, 2 units of PRBC have been transfused. Pending renal biopsy results and bone marrow biopsy planned for 01/30/20. Assessment & Plan   Acute kidney injury on chronic kidney disease, stage IIIb -Possibly secondary to urinary tract infection, decreased oral intake, with ARB use, possible AIN from Augmentin use versus hypercalcemia -Creatinine on admission 5.18 -Renal ultrasound shows no obstructive pathology -Creatinine currently down to 3.10 -Nephrology consulted and appreciated, pending renal biopsy results -Currently on IVF  Anemia of chronic disease -Hemoglobin was down to 6.7, 2 units PRBC  transfused -hemoglobin currently 8.8 -FOBT pending -Anemia panel shows adequate iron and storage -Continue to monitor CBC  Recurrent UTI -Patient recently completed Augmentin -UA showed few bacteria, 11-20 WBC, large leukocytes, negative nitrites -Urine culture showed no growth -Has been placed on ceftriaxone- discontinued  Hypercalcemia -PTH 24 (WNL) and pending PTHrP -kappa free chain 49.9; lamda free light chains 12252.6 -Mspike 0.6 -25-hydroxy vitamin D 20.67 (low) -?MM -patient given calcitonin on 2/5  Right hip pain/ back pain -?if due to the above vs MM -Bone survey: Multiple small rounded lucencies are noted in the visualized skull which may represent venous lakes, but lytic lesions related to multiple myeloma metastatic disease cannot be excluded.  Lytic destructive lesion seen in the proximal right fibular shaft consistent with metastatic disease or malignancy -PT consulted and recommended SNF -oncology consulted and appreciated, suspect will need bone marrow biopsy- planned on 2/9 -patient continues to complain of pain -will add on IV dilaudid  Gout -uric acid 17.1  Essential Hypertension -ARB held acute kidney injury -Continue hydralazine  Hypothyroidism -Continue Synthroid  Hypokalemia -resolved with repletion -monitor BMP  DVT Prophylaxis Heparin  Code Status: DNR  Family Communication: None at bedside  Disposition Plan: Admitted.  Patient from home, presented with hip pain.  Found to have worsening creatinine on chronic kidney disease, stage III.  Nephrology consulted and appreciated. Currently pending renal biopsy results and bone marrow biopsy on 2/9. Disposition possible SNF.  Consultants Nephrology Interventional radiology Oncology  Procedures  Renal ultrasound Left renal biopsy   Antibiotics   Anti-infectives (From admission, onward)   Start     Dose/Rate Route Frequency  Ordered Stop   01/23/20 1930  cefTRIAXone (ROCEPHIN) 1 g in  sodium chloride 0.9 % 100 mL IVPB     1 g 200 mL/hr over 30 Minutes Intravenous Every 24 hours 01/23/20 1919 01/27/20 1911      Subjective:   Carolyn Myers seen and examined today.  Patient complains of having a restless night due to pain in various areas.  She denies chest pain, shortness of breath, abdominal pain, nausea or vomiting, diarrhea or constipation, dizziness or headache.  Inquires about when her bone biopsy will be. Objective:   Vitals:   01/29/20 0147 01/29/20 0348 01/29/20 0352 01/29/20 0808  BP: (!) 161/63 (!) 168/57  (!) 159/53  Pulse: 63 60  63  Resp: _0 Temp:  98.1 F (36.7 C)    TempSrc:  Oral    SpO2: 96% 90%  98%  Weight:   73.3 kg   Height:        Intake/Output Summary (Last 24 hours) at 01/29/2020 0909 Last data filed at 01/29/2020 0700 Gross per 24 hour  Intake 771 ml  Output 700 ml  Net 71 ml   Filed Weights   01/27/20 0544 01/27/20 2300 01/29/20 0352  Weight: 74.9 kg 74.7 kg 73.3 kg   Exam  General: Well developed, well nourished, NAD, appears stated age  64: NCAT, mucous membranes moist.   Cardiovascular: S1 S2 auscultated, RRR  Respiratory: Clear to auscultation bilaterally  Abdomen: Soft, nontender, nondistended, + bowel sounds  Extremities: warm dry without cyanosis clubbing or edema  Neuro: AAOx3, nonfocal  Psych: Appropriate mood and affect  Data Reviewed: I have personally reviewed following labs and imaging studies  CBC: Recent Labs  Lab 01/22/20 1613 01/22/20 1613 01/23/20 1323 01/23/20 1323 01/24/20 0523 01/24/20 1930 01/25/20 0331 01/26/20 0640 01/27/20 0513 01/28/20 0610 01/29/20 0321  WBC 15.1*  --  14.6*  --  7.9  --  8.8 8.7  --   --   --   HGB 8.5 Repeated and verified X2.*   < > 8.1*   < > 6.7*   < > 9.6* 9.7* 9.7* 8.8* 8.8*  HCT 26.0*   < > 25.3*   < > 20.9*   < > 29.4* 30.4* 29.8* 27.6* 27.0*  MCV 86.1  --  88.5  --  88.9  --  84.2 84.0  --   --   --   PLT 184.0  --  164  --  138*  --   129* 123*  --   --   --    < > = values in this interval not displayed.   Basic Metabolic Panel: Recent Labs  Lab 01/24/20 0523 01/24/20 0523 01/25/20 0331 01/26/20 0640 01/27/20 0513 01/28/20 0610 01/29/20 0321  NA 139   < > 137 139 139 139 139  K 3.7   < > 3.3* 4.0 4.0 4.2 4.1  CL 102   < > 102 108 106 108 107  CO2 26   < > 24 21* 21* 20* 20*  GLUCOSE 100*   < > 99 95 108* 115* 100*  BUN 49*   < > 44* 35* 30* 30* 29*  CREATININE 4.77*   < > 3.94* 3.35* 3.11* 3.15* 3.10*  CALCIUM 11.0*   < > 10.8* 11.1* 10.3 11.0* 10.7*  MG  --   --  2.2  --   --   --   --   PHOS 5.1*  --  4.5  --   --  4.4 4.6   < > = values in this interval not displayed.   GFR: Estimated Creatinine Clearance: 13.7 mL/min (A) (by C-G formula based on SCr of 3.1 mg/dL (H)). Liver Function Tests: Recent Labs  Lab 01/24/20 0523 01/25/20 0331 01/28/20 0610 01/29/20 0321  ALBUMIN 2.9* 2.9* 3.1* 3.0*   No results for input(s): LIPASE, AMYLASE in the last 168 hours. No results for input(s): AMMONIA in the last 168 hours. Coagulation Profile: Recent Labs  Lab 01/26/20 0640  INR 1.0   Cardiac Enzymes: Recent Labs  Lab 01/23/20 1850  CKTOTAL 91   BNP (last 3 results) No results for input(s): PROBNP in the last 8760 hours. HbA1C: No results for input(s): HGBA1C in the last 72 hours. CBG: No results for input(s): GLUCAP in the last 168 hours. Lipid Profile: No results for input(s): CHOL, HDL, LDLCALC, TRIG, CHOLHDL, LDLDIRECT in the last 72 hours. Thyroid Function Tests: No results for input(s): TSH, T4TOTAL, FREET4, T3FREE, THYROIDAB in the last 72 hours. Anemia Panel: No results for input(s): VITAMINB12, FOLATE, FERRITIN, TIBC, IRON, RETICCTPCT in the last 72 hours. Urine analysis:    Component Value Date/Time   COLORURINE YELLOW 01/23/2020 1855   APPEARANCEUR CLOUDY (A) 01/23/2020 1855   LABSPEC 1.012 01/23/2020 1855   PHURINE 8.0 01/23/2020 1855   GLUCOSEU NEGATIVE 01/23/2020 1855    HGBUR NEGATIVE 01/23/2020 1855   BILIRUBINUR NEGATIVE 01/23/2020 1855   BILIRUBINUR negative 01/17/2020 1535   KETONESUR NEGATIVE 01/23/2020 1855   PROTEINUR 30 (A) 01/23/2020 1855   UROBILINOGEN 0.2 01/17/2020 1535   NITRITE NEGATIVE 01/23/2020 1855   LEUKOCYTESUR LARGE (A) 01/23/2020 1855   Sepsis Labs: _0 (procalcitonin:4,lacticidven:4)  ) Recent Results (from the past 240 hour(s))  SARS CORONAVIRUS 2 (TAT 6-24 HRS) Nasopharyngeal Nasopharyngeal Swab     Status: None   Collection Time: 01/23/20  3:06 PM   Specimen: Nasopharyngeal Swab  Result Value Ref Range Status   SARS Coronavirus 2 NEGATIVE NEGATIVE Final    Comment: (NOTE) SARS-CoV-2 target nucleic acids are NOT DETECTED. The SARS-CoV-2 RNA is generally detectable in upper and lower respiratory specimens during the acute phase of infection. Negative results do not preclude SARS-CoV-2 infection, do not rule out co-infections with other pathogens, and should not be used as the sole basis for treatment or other patient management decisions. Negative results must be combined with clinical observations, patient history, and epidemiological information. The expected result is Negative. Fact Sheet for Patients: SugarRoll.be Fact Sheet for Healthcare Providers: https://www.woods-mathews.com/ This test is not yet approved or cleared by the [place] FDA and  has been authorized for detection and/or diagnosis of SARS-CoV-2 by FDA under an Emergency Use Authorization (EUA). This EUA will remain  in effect (meaning this test can be used) for the duration of the COVID-19 declaration under Section 56 4(b)(1) of the Act, 21 U.S.C. section 360bbb-3(b)(1), unless the authorization is terminated or revoked sooner. Performed at [facility], [place] [address] [place], [place] [address]   Culture, Urine     Status: None   Collection Time: 01/23/20  6:54 PM   Specimen: Urine,  Clean Catch  Result Value Ref Range Status   Specimen Description URINE, CLEAN CATCH  Final   Special Requests Normal  Final   Culture   Final    NO GROWTH Performed at [facility], [place] [address], [place], [place] [address]    Report Status 01/24/2020 FINAL  Final      Radiology Studies: No results found.  Scheduled Meds: . heparin  5,000 Units Subcutaneous Q8H  . hydrALAZINE  100 mg Oral TID  . levothyroxine  88 mcg Oral Q0600  . rosuvastatin  10 mg Oral QHS   Continuous Infusions:    LOS: 6 days   Time Spent in minutes   30 minutes  Ryan Palermo D.O. on 01/29/2020 at 9:09 AM  Between 7am to 7pm - Please see pager noted on [email or web address]  After 7pm go to [email or web address]  And look for the night coverage person covering for me after hours  [facility] Office  [phone]

## 2020-01-29 NOTE — Progress Notes (Signed)
Patient ID: Carolyn Myers, female   DOB: February 09, 1936, 84 y.o.   MRN: [id number] [facility] Progress Note   Assessment/ Plan:   1. Acute kidney Injury on chronic kidney disease stage III versus progression: Baseline creatinine ranging 1.7-1.9 and now with acute kidney injury that appears to be consistent with injury from multiple myeloma (lambda light chain).  Will obtain preliminary report of renal biopsy today.  2.  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  Status post calcitonin. 3.  Multiple myeloma: Based on presence of M spike, elevated lambda light chains and lytic lesions on both surveillance.  Awaiting bone marrow biopsy. 4.  Hypertension: Blood pressure remains significantly elevated, will add nightly nifedipine XL.  Subjective:   Reports to be feeling fair, denies any chest pain or shortness of breath and awaiting bone marrow biopsy tomorrow.   Objective:   BP (!) 173/66 (BP Location: Right Arm)   Pulse (!) 57   Temp 97.7 F (36.5 C) (Oral)   Resp 18   Ht '5\' 3"'$  (1.6 m)   Wt 73.3 kg   SpO2 97%   BMI 28.63 kg/m   Intake/Output Summary (Last 24 hours) at 01/29/2020 1227 Last data filed at 01/29/2020 0900 Gross per 24 hour  Intake 891 ml  Output 700 ml  Net 191 ml   Weight change: -1.362 kg  Physical Exam: Gen: Comfortably resting in bed, watching television CVS: Pulse regular rhythm, normal rate, S1 and S2 normal Resp: Clear to auscultation, no rales/rhonchi Abd: Soft, obese, nontender Ext: No lower extremity edema  Imaging: No results found.  Labs: BMET Recent Labs  Lab 01/23/20 1323 01/23/20 1724 01/24/20 0523 01/25/20 0331 01/26/20 0640 01/27/20 0513 01/28/20 0610 01/29/20 0321  NA 138  --  139 137 139 139 139 139  K 4.0  --  3.7 3.3* 4.0 4.0 4.2 4.1  CL 95*  --  102 102 108 106 108 107  CO2 27  --  26 24 21* 21* 20* 20*  GLUCOSE 114*  --  100* 99  95 108* 115* 100*  BUN 53*  --  49* 44* 35* 30* 30* 29*  CREATININE 5.18*  --  4.77* 3.94* 3.35* 3.11* 3.15* 3.10*  CALCIUM 12.1* 11.4* 11.0* 10.8* 11.1* 10.3 11.0* 10.7*  PHOS  --   --  5.1* 4.5  --   --  4.4 4.6   CBC Recent Labs  Lab 01/23/20 1323 01/23/20 1323 01/24/20 0523 01/24/20 1930 01/25/20 0331 01/25/20 0331 01/26/20 0640 01/27/20 0513 01/28/20 0610 01/29/20 0321  WBC 14.6*  --  7.9  --  8.8  --  8.7  --   --   --   HGB 8.1*   < > 6.7*   < > 9.6*   < > 9.7* 9.7* 8.8* 8.8*  HCT 25.3*   < > 20.9*   < > 29.4*   < > 30.4* 29.8* 27.6* 27.0*  MCV 88.5  --  88.9  --  84.2  --  84.0  --   --   --   PLT 164  --  138*  --  129*  --  123*  --   --   --    < > = values in this interval not displayed.   Medications:    . heparin  5,000 Units Subcutaneous Q8H  . hydrALAZINE  100 mg Oral TID  . levothyroxine  88 mcg Oral Q0600  .  rosuvastatin  10 mg Oral QHS   Elmarie Shiley, MD 01/29/2020, 12:27 PM

## 2020-01-29 NOTE — Progress Notes (Signed)
PT Cancellation Note  Patient Details Name: Carolyn Myers MRN: [id number] DOB: 1936/04/07   Cancelled Treatment:    Reason Eval/Treat Not Completed: Patient declined, no reason specified Attempted to see patient second time for PT treatment. Pt declined and reports wanting to rest today. PT will continue to follow acutely.   Earney Navy, PTA Acute Rehabilitation Services Pager: [phone] Office: [phone]   01/29/2020, 3:42 PM

## 2020-01-30 ENCOUNTER — Other Ambulatory Visit: Payer: Self-pay

## 2020-01-30 ENCOUNTER — Inpatient Hospital Stay (HOSPITAL_COMMUNITY): Payer: Medicare Other

## 2020-01-30 LAB — RENAL FUNCTION PANEL
Albumin: 3 g/dL — ABNORMAL LOW (ref 3.5–5.0)
Anion gap: 14 (ref 5–15)
BUN: 33 mg/dL — ABNORMAL HIGH (ref 8–23)
CO2: 22 mmol/L (ref 22–32)
Calcium: 11.2 mg/dL — ABNORMAL HIGH (ref 8.9–10.3)
Chloride: 100 mmol/L (ref 98–111)
Creatinine, Ser: 3.14 mg/dL — ABNORMAL HIGH (ref 0.44–1.00)
GFR calc Af Amer: 15 mL/min — ABNORMAL LOW (ref >60)
GFR calc non Af Amer: 13 mL/min — ABNORMAL LOW (ref >60)
Glucose, Bld: 92 mg/dL (ref 70–99)
Phosphorus: 5.4 mg/dL — ABNORMAL HIGH (ref 2.5–4.6)
Potassium: 4.1 mmol/L (ref 3.5–5.1)
Sodium: 136 mmol/L (ref 135–145)

## 2020-01-30 LAB — UPEP/UIFE/LIGHT CHAINS/TP, 24-HR UR
% BETA, Urine: 5.1 %
ALPHA 1 URINE: 0.4 %
Albumin, U: 4.3 %
Alpha 2, Urine: 0.8 %
Free Kappa Lt Chains,Ur: 124.32 mg/L — ABNORMAL HIGH (ref 0.63–113.79)
Free Kappa/Lambda Ratio: <0.01 — ABNORMAL LOW (ref 1.03–31.76)
GAMMA GLOBULIN URINE: 89.4 %
M-SPIKE %, Urine: 85.6 % — ABNORMAL HIGH
M-Spike, Mg/24 Hr: 4597 mg/24 hr — ABNORMAL HIGH
Total Protein, Urine-Ur/day: 5370 mg/24 hr — ABNORMAL HIGH (ref 30–150)
Total Protein, Urine: 537 mg/dL
Total Volume: 1000

## 2020-01-30 LAB — CBC WITH DIFFERENTIAL/PLATELET
Abs Immature Granulocytes: 0.74 10*3/uL — ABNORMAL HIGH (ref 0.00–0.07)
Basophils Absolute: 0.1 10*3/uL (ref 0.0–0.1)
Basophils Relative: 1 %
Eosinophils Absolute: 0.1 10*3/uL (ref 0.0–0.5)
Eosinophils Relative: 1 %
HCT: 26.8 % — ABNORMAL LOW (ref 36.0–46.0)
Hemoglobin: 8.5 g/dL — ABNORMAL LOW (ref 12.0–15.0)
Immature Granulocytes: 7 %
Lymphocytes Relative: 28 %
Lymphs Abs: 3 10*3/uL (ref 0.7–4.0)
MCH: 27.6 pg (ref 26.0–34.0)
MCHC: 31.7 g/dL (ref 30.0–36.0)
MCV: 87 fL (ref 80.0–100.0)
Monocytes Absolute: 1.9 10*3/uL — ABNORMAL HIGH (ref 0.1–1.0)
Monocytes Relative: 17 %
Neutro Abs: 5 10*3/uL (ref 1.7–7.7)
Neutrophils Relative %: 46 %
Platelets: 110 10*3/uL — ABNORMAL LOW (ref 150–400)
RBC: 3.08 MIL/uL — ABNORMAL LOW (ref 3.87–5.11)
RDW: 16.1 % — ABNORMAL HIGH (ref 11.5–15.5)
WBC: 10.8 10*3/uL — ABNORMAL HIGH (ref 4.0–10.5)
nRBC: 1.6 % — ABNORMAL HIGH (ref 0.0–0.2)

## 2020-01-30 MED ORDER — MIDAZOLAM HCL 2 MG/2ML IJ SOLN
INTRAMUSCULAR | Status: AC
  Filled 2020-01-30: qty 2

## 2020-01-30 MED ORDER — FENTANYL CITRATE (PF) 100 MCG/2ML IJ SOLN
INTRAMUSCULAR | Status: AC | PRN
  Administered 2020-01-30 (×2): 25 ug via INTRAVENOUS

## 2020-01-30 MED ORDER — CALCITONIN (SALMON) 200 UNIT/ML IJ SOLN
4.0000 [IU]/kg | Freq: Two times a day (BID) | INTRAMUSCULAR | Status: AC
  Administered 2020-01-30 – 2020-01-31 (×2): 314 [IU] via SUBCUTANEOUS
  Filled 2020-01-30 (×2): qty 1.57

## 2020-01-30 MED ORDER — FENTANYL CITRATE (PF) 100 MCG/2ML IJ SOLN
INTRAMUSCULAR | Status: AC
  Filled 2020-01-30: qty 2

## 2020-01-30 MED ORDER — MIDAZOLAM HCL 2 MG/2ML IJ SOLN
INTRAMUSCULAR | Status: AC | PRN
  Administered 2020-01-30: 0.5 mg via INTRAVENOUS

## 2020-01-30 NOTE — Progress Notes (Signed)
Physical Therapy Treatment Patient Details Name: Carolyn Myers MRN: [id number] DOB: [birth year] Today's Date: 01/30/2020    History of Present Illness Pt is an 84 yo female with CKD stage III, bilateral renal artery stenosis, HTN, and gout, who presented with worsening of right hip pain, and AKI following abnormal bloodwork at PCP on 2/1.    PT Comments    Patient seen for mobility progression. Pt tolerated short distance gait in room. Pt fatigued after sitting up in chair for 3 hours prior to arrival. Pt requires increased assistance to stand from low surface. Continue to progress as tolerated.     Follow Up Recommendations  SNF;Supervision/Assistance - 24 hour(could progress to home if increased family assistance)     Equipment Recommendations  Rolling walker with 5" wheels;3in1 (PT)    Recommendations for Other Services       Precautions / Restrictions Precautions Precautions: Fall Restrictions Weight Bearing Restrictions: No    Mobility  Bed Mobility Overal bed mobility: Needs Assistance Bed Mobility: Sit to Sidelying         Sit to sidelying: Min assist General bed mobility comments: assist to bring bilat LE into bed  Transfers Overall transfer level: Needs assistance Equipment used: Rolling walker (2 wheeled) Transfers: Sit to/from Stand Sit to Stand: Min guard;Mod assist         General transfer comment: min guard to stand from recliner and mod A to power up from low commode using grab bar; cues for safe hand placement   Ambulation/Gait Ambulation/Gait assistance: Min guard Gait Distance (Feet): (30 ft total in room) Assistive device: Rolling walker (2 wheeled) Gait Pattern/deviations: Step-through pattern;Decreased stride length Gait velocity: decreased   General Gait Details: slow, steady gait; min guard for safety   Stairs             Wheelchair Mobility    Modified Rankin (Stroke Patients Only)       Balance Overall balance  assessment: Needs assistance Sitting-balance support: No upper extremity supported;Feet supported Sitting balance-Leahy Scale: Good     Standing balance support: Bilateral upper extremity supported Standing balance-Leahy Scale: Fair                              Cognition Arousal/Alertness: Awake/alert Behavior During Therapy: WFL for tasks assessed/performed Overall Cognitive Status: Within Functional Limits for tasks assessed                                        Exercises      General Comments        Pertinent Vitals/Pain Pain Assessment: Faces Faces Pain Scale: Hurts a little bit Pain Location: R thigh Pain Descriptors / Indicators: Guarding Pain Intervention(s): Monitored during session;Repositioned    Home Living                      Prior Function            PT Goals (current goals can now be found in the care plan section) Progress towards PT goals: Progressing toward goals    Frequency    Min 2X/week      PT Plan Current plan remains appropriate    Co-evaluation              AM-PAC PT "6 Clicks" Mobility   Outcome Measure  Help needed  turning from your back to your side while in a flat bed without using bedrails?: A Little Help needed moving from lying on your back to sitting on the side of a flat bed without using bedrails?: A Little Help needed moving to and from a bed to a chair (including a wheelchair)?: A Little Help needed standing up from a chair using your arms (e.g., wheelchair or bedside chair)?: A Little Help needed to walk in hospital room?: A Little Help needed climbing 3-5 steps with a railing? : A Lot 6 Click Score: 17    End of Session Equipment Utilized During Treatment: Gait belt Activity Tolerance: Patient tolerated treatment well Patient left: with call bell/phone within reach;with family/visitor present;in bed Nurse Communication: Mobility status;Other (comment)(pt has small  sores on buttocks) PT Visit Diagnosis: Difficulty in walking, not elsewhere classified (R26.2);Muscle weakness (generalized) (M62.81)     Time: [id number] PT Time Calculation (min) (ACUTE ONLY): 20 min  Charges:  $Gait Training: 8-22 mins                     Earney Navy, PTA Acute Rehabilitation Services Pager: [phone] Office: [phone]     Darliss Cheney 01/30/2020, 4:36 PM

## 2020-01-30 NOTE — Progress Notes (Signed)
PROGRESS NOTE    Carolyn Myers  UUV:[id number] DOB: [birth year] DOA: 01/23/2020 PCP: Darreld Mclean, MD   Brief Narrative:  HPI on 01/23/2020 by Dr. Wynetta Fines [name] is a 84 y.o. female with medical history significant of CKD stage III, bilateral renal artery stenosis, chronic right hip pain, hypertension, gout, thyroidism, presented with worsening of right hip pain, and AKI.  Patient was recently treated for E. coli UTI, by her PCP on 01/17/2020 with Augmentin.  Before that she was evaluated by orthopedic surgery for worsening of her right hip pain and was placed on Tylenol 3, and image study showed no significant right hip pathology but some lumbar vertebral arthritis.  He went to see her PCP yesterday and blood work showed significant elevation of creatinine level from her baseline 1.92 to 5.3.  She denied taking any NSAIDs on a regular basis. Regarding the right hip pain, she said the nature has been aching and sometimes shooting down to the back side of her right leg, she has been using a walker for ambulation.  Interim history Admitted for acute kidney injury, nephrology consulted and appreciated.  Patient noted to have UTI and has completed Augmentin therapy prior to admission.  Only complaining of right hip and back pain.  Patient noted to have a hemoglobin of 6.7 this morning, 2 units of PRBC have been transfused. Pending renal biopsy results and bone marrow biopsy done today 01/30/20. Assessment & Plan   Acute kidney injury on chronic kidney disease, stage IIIb -Possibly secondary to urinary tract infection, decreased oral intake, with ARB use, possible AIN from Augmentin use versus hypercalcemia -Creatinine on admission 5.18 -Renal ultrasound shows no obstructive pathology -Creatinine currently down to 3.14 -Nephrology consulted and appreciated, pending renal biopsy results -Currently on IVF  Anemia of chronic disease -Hemoglobin was down to 6.7, 2 units PRBC  transfused -hemoglobin currently 8.5 -FOBT pending -Anemia panel shows adequate iron and storage -Continue to monitor CBC  Recurrent UTI -Patient recently completed Augmentin -UA showed few bacteria, 11-20 WBC, large leukocytes, negative nitrites -Urine culture showed no growth -Has been placed on ceftriaxone- discontinued  Hypercalcemia -PTH 24 (WNL) and pending PTHrP -kappa free chain 49.9; lamda free light chains 12252.6 -Mspike 0.6 -25-hydroxy vitamin D 20.67 (low) -?MM- bone biopsy pending  -patient given calcitonin on 2/5  Right hip pain/ back pain -?if due to the above vs MM -Bone survey: Multiple small rounded lucencies are noted in the visualized skull which may represent venous lakes, but lytic lesions related to multiple myeloma metastatic disease cannot be excluded.  Lytic destructive lesion seen in the proximal right fibular shaft consistent with metastatic disease or malignancy -PT consulted and recommended SNF -oncology consulted and appreciated, suspect will need bone marrow biopsy- planned on 2/9 -patient continues to complain of pain -continue oral and IV pain dilaudid PRN  Gout -uric acid 17.1  Essential Hypertension -ARB held acute kidney injury -Continue hydralazine  Hypothyroidism -Continue Synthroid  Hypokalemia -resolved with repletion -monitor BMP  DVT Prophylaxis Heparin  Code Status: DNR  Family Communication: None at bedside. Spoke with daughter on 2/8.  Disposition Plan: Admitted.  Patient from home, presented with hip pain.  Found to have worsening creatinine on chronic kidney disease, stage III.  Nephrology consulted and appreciated. Currently pending renal and bone marrow biopsy results. Disposition possible SNF.  Consultants Nephrology Interventional radiology Oncology  Procedures  Renal ultrasound Left renal biopsy  CT-guided aspirate and core biopsy of right posterior iliac bone  Antibiotics   Anti-infectives (From  admission, onward)   Start     Dose/Rate Route Frequency Ordered Stop   01/23/20 1930  cefTRIAXone (ROCEPHIN) 1 g in sodium chloride 0.9 % 100 mL IVPB     1 g 200 mL/hr over 30 Minutes Intravenous Every 24 hours 01/23/20 1919 01/27/20 1911      Subjective:   Carolyn Myers seen and examined today.  Patient feels very sleepy after bone marrow biopsy. Denies pain at this time. Denies chest pain, shortness of breath, abdominal pain, N/V/D/C, dizziness, headahce.   Objective:   Vitals:   01/30/20 0835 01/30/20 0840 01/30/20 0845 01/30/20 0850  BP: (!) 145/55 (!) 143/57 (!) 151/59 (!) 135/56  Pulse: 66 65 67 65  Resp:      Temp:      TempSrc:      SpO2: 100% 100% 100% 100%  Weight:      Height:        Intake/Output Summary (Last 24 hours) at 01/30/2020 0859 Last data filed at 01/30/2020 0857 Gross per 24 hour  Intake 586 ml  Output 700 ml  Net -114 ml   Filed Weights   01/27/20 2300 01/29/20 0352 01/30/20 0352  Weight: 74.7 kg 73.3 kg 78.3 kg   Exam  General: Well developed, well nourished, NAD, appears stated age  43: NCAT, mucous membranes moist.   Cardiovascular: S1 S2 auscultated, RRR  Respiratory: Clear to auscultation bilaterally   Abdomen: Soft, nontender, nondistended, + bowel sounds  Extremities: warm dry without cyanosis clubbing or edema  Neuro: AAOx3, nonfocal  Psych: Appropriate mood and affect  Data Reviewed: I have personally reviewed following labs and imaging studies  CBC: Recent Labs  Lab 01/23/20 1323 01/23/20 1323 01/24/20 0523 01/24/20 1930 01/25/20 0331 01/25/20 0331 01/26/20 0640 01/27/20 0513 01/28/20 0610 01/29/20 0321 01/30/20 0417  WBC 14.6*  --  7.9  --  8.8  --  8.7  --   --   --  10.8*  NEUTROABS  --   --   --   --   --   --   --   --   --   --  5.0  HGB 8.1*   < > 6.7*   < > 9.6*   < > 9.7* 9.7* 8.8* 8.8* 8.5*  HCT 25.3*   < > 20.9*   < > 29.4*   < > 30.4* 29.8* 27.6* 27.0* 26.8*  MCV 88.5  --  88.9  --  84.2  --   84.0  --   --   --  87.0  PLT 164  --  138*  --  129*  --  123*  --   --   --  110*   < > = values in this interval not displayed.   Basic Metabolic Panel: Recent Labs  Lab 01/24/20 0523 01/24/20 0523 01/25/20 0331 01/25/20 0331 01/26/20 0640 01/27/20 0513 01/28/20 0610 01/29/20 0321 01/30/20 0417  NA 139   < > 137   < > 139 139 139 139 136  K 3.7   < > 3.3*   < > 4.0 4.0 4.2 4.1 4.1  CL 102   < > 102   < > 108 106 108 107 100  CO2 26   < > 24   < > 21* 21* 20* 20* 22  GLUCOSE 100*   < > 99   < > 95 108* 115* 100* 92  BUN 49*   < > 44*   < >  35* 30* 30* 29* 33*  CREATININE 4.77*   < > 3.94*   < > 3.35* 3.11* 3.15* 3.10* 3.14*  CALCIUM 11.0*   < > 10.8*   < > 11.1* 10.3 11.0* 10.7* 11.2*  MG  --   --  2.2  --   --   --   --   --   --   PHOS 5.1*  --  4.5  --   --   --  4.4 4.6 5.4*   < > = values in this interval not displayed.   GFR: Estimated Creatinine Clearance: 13.9 mL/min (A) (by C-G formula based on SCr of 3.14 mg/dL (H)). Liver Function Tests: Recent Labs  Lab 01/24/20 0523 01/25/20 0331 01/28/20 0610 01/29/20 0321 01/30/20 0417  ALBUMIN 2.9* 2.9* 3.1* 3.0* 3.0*   No results for input(s): LIPASE, AMYLASE in the last 168 hours. No results for input(s): AMMONIA in the last 168 hours. Coagulation Profile: Recent Labs  Lab 01/26/20 0640  INR 1.0   Cardiac Enzymes: Recent Labs  Lab 01/23/20 1850  CKTOTAL 91   BNP (last 3 results) No results for input(s): PROBNP in the last 8760 hours. HbA1C: No results for input(s): HGBA1C in the last 72 hours. CBG: No results for input(s): GLUCAP in the last 168 hours. Lipid Profile: No results for input(s): CHOL, HDL, LDLCALC, TRIG, CHOLHDL, LDLDIRECT in the last 72 hours. Thyroid Function Tests: No results for input(s): TSH, T4TOTAL, FREET4, T3FREE, THYROIDAB in the last 72 hours. Anemia Panel: No results for input(s): VITAMINB12, FOLATE, FERRITIN, TIBC, IRON, RETICCTPCT in the last 72 hours. Urine analysis:      Component Value Date/Time   COLORURINE YELLOW 01/23/2020 1855   APPEARANCEUR CLOUDY (A) 01/23/2020 1855   LABSPEC 1.012 01/23/2020 1855   PHURINE 8.0 01/23/2020 1855   GLUCOSEU NEGATIVE 01/23/2020 1855   HGBUR NEGATIVE 01/23/2020 1855   BILIRUBINUR NEGATIVE 01/23/2020 1855   BILIRUBINUR negative 01/17/2020 1535   KETONESUR NEGATIVE 01/23/2020 1855   PROTEINUR 30 (A) 01/23/2020 1855   UROBILINOGEN 0.2 01/17/2020 1535   NITRITE NEGATIVE 01/23/2020 1855   LEUKOCYTESUR LARGE (A) 01/23/2020 1855   Sepsis Labs: '@LABRCNTIP'$ (procalcitonin:4,lacticidven:4)  ) Recent Results (from the past 240 hour(s))  SARS CORONAVIRUS 2 (TAT 6-24 HRS) Nasopharyngeal Nasopharyngeal Swab     Status: None   Collection Time: 01/23/20  3:06 PM   Specimen: Nasopharyngeal Swab  Result Value Ref Range Status   SARS Coronavirus 2 NEGATIVE NEGATIVE Final    Comment: (NOTE) SARS-CoV-2 target nucleic acids are NOT DETECTED. The SARS-CoV-2 RNA is generally detectable in upper and lower respiratory specimens during the acute phase of infection. Negative results do not preclude SARS-CoV-2 infection, do not rule out co-infections with other pathogens, and should not be used as the sole basis for treatment or other patient management decisions. Negative results must be combined with clinical observations, patient history, and epidemiological information. The expected result is Negative. Fact Sheet for Patients: SugarRoll.be Fact Sheet for Healthcare Providers: https://www.woods-mathews.com/ This test is not yet approved or cleared by the [place] FDA and  has been authorized for detection and/or diagnosis of SARS-CoV-2 by FDA under an Emergency Use Authorization (EUA). This EUA will remain  in effect (meaning this test can be used) for the duration of the COVID-19 declaration under Section 56 4(b)(1) of the Act, 21 U.S.C. section 360bbb-3(b)(1), unless the  authorization is terminated or revoked sooner. Performed at [facility], [place] [address], [place], [place] [address]  Culture, Urine     Status: None   Collection Time: 01/23/20  6:54 PM   Specimen: Urine, Clean Catch  Result Value Ref Range Status   Specimen Description URINE, CLEAN CATCH  Final   Special Requests Normal  Final   Culture   Final    NO GROWTH Performed at [facility], [address], [place], [place] [address]    Report Status 01/24/2020 FINAL  Final      Radiology Studies: No results found.   Scheduled Meds: . hydrALAZINE  100 mg Oral TID  . levothyroxine  88 mcg Oral Q0600  . NIFEdipine  30 mg Oral QHS  . rosuvastatin  10 mg Oral QHS   Continuous Infusions:    LOS: 7 days   Time Spent in minutes   30 minutes  Cecil Vandyke D.O. on 01/30/2020 at 8:59 AM  Between 7am to 7pm - Please see pager noted on [email or web address]  After 7pm go to [email or web address]  And look for the night coverage person covering for me after hours  [facility] Office  [phone]

## 2020-01-30 NOTE — Progress Notes (Signed)
Patient ID: Carolyn Myers, female   DOB: 1936-01-15, 84 y.o.   MRN: [id number] [facility] Progress Note   Assessment/ Plan:   1. Acute kidney Injury on chronic kidney disease stage III versus progression: Baseline creatinine ranging 1.7-1.9 and now with acute kidney injury that appears to be consistent with injury from multiple myeloma (lambda light chain).  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  She does not have any acute indications for dialysis and we will continue to follow her as an outpatient following discharge.  I have scheduled her to see me in the office on 02/28/2019 at 1 PM.  2.  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  Calcium level rising again, retreat with calcitonin. 3.  Multiple myeloma: Based on presence of M spike, elevated lambda light chains and lytic lesions on both surveillance.  Status post bone marrow biopsy today. 4.  Hypertension: Blood pressure remains significantly elevated, will add nightly nifedipine XL.  Subjective:   Denies any problems overnight and underwent bone marrow biopsy uneventfully this morning.   Objective:   BP (!) 146/60   Pulse 65   Temp 98.6 F (37 C) (Oral)   Resp 17   Ht '5\' 3"'$  (1.6 m)   Wt 78.3 kg   SpO2 100%   BMI 30.58 kg/m   Intake/Output Summary (Last 24 hours) at 01/30/2020 1158 Last data filed at 01/30/2020 0857 Gross per 24 hour  Intake 466 ml  Output 700 ml  Net -234 ml   Weight change: 5 kg  Physical Exam: Gen: Comfortably resting in bed, watching television CVS: Pulse regular rhythm, normal rate, S1 and S2 normal Resp: Clear to auscultation, no rales/rhonchi Abd: Soft, obese, nontender Ext: No lower extremity edema  Imaging: CT BONE  MARROW BIOPSY & ASPIRATION  Result Date: 01/30/2020 INDICATION: 84 year old female with a history multiple myeloma EXAM: CT BONE MARROW BIOPSY AND ASPIRATION MEDICATIONS: None. ANESTHESIA/SEDATION: Moderate (conscious) sedation was employed during this procedure. A total of Versed 0.5 mg and Fentanyl 50 mcg was administered intravenously. Moderate Sedation Time: 10 minutes. The patient's level of consciousness and vital signs were monitored continuously by radiology nursing throughout the procedure under my direct supervision. FLUOROSCOPY TIME:  CT COMPLICATIONS: None PROCEDURE: The procedure risks, benefits, and alternatives were explained to the patient. Questions regarding the procedure were encouraged and answered. The patient understands and consents to the procedure. Scout CT of the pelvis was performed for surgical planning purposes. The right posterior pelvis was prepped with Chlorhexidine in a sterile fashion, and a sterile drape was applied covering the operative field. A sterile gown and sterile gloves were used for the procedure. Local anesthesia was provided with 1% Lidocaine. Right posterior iliac bone was targeted for biopsy. The skin and subcutaneous tissues were infiltrated with 1% lidocaine without epinephrine. A small stab incision was made with an 11 blade scalpel, and an 11 gauge Murphy needle was advanced with CT guidance to the posterior cortex. Manual forced was used to advance the needle through the posterior cortex and the stylet was removed. A bone marrow aspirate was retrieved and passed to a cytotechnologist in the room. The Murphy needle was then advanced without the stylet for a core biopsy. The core biopsy was retrieved and also passed to a cytotechnologist. Manual pressure was used for  hemostasis and a sterile dressing was placed. No complications were encountered no significant blood loss was encountered. Patient tolerated the procedure well and remained hemodynamically stable  throughout. IMPRESSION: Status post CT-guided bone marrow biopsy, with tissue specimen sent to pathology for complete histopathologic analysis Signed, Dulcy Fanny. Earleen Newport, DO [facility] Radiology Electronically Signed   By: Corrie Mckusick D.O.   On: 01/30/2020 10:31    Labs: BMET Recent Labs  Lab 01/24/20 0523 01/25/20 0331 01/26/20 0640 01/27/20 0513 01/28/20 0610 01/29/20 0321 01/30/20 0417  NA 139 137 139 139 139 139 136  K 3.7 3.3* 4.0 4.0 4.2 4.1 4.1  CL 102 102 108 106 108 107 100  CO2 26 24 21* 21* 20* 20* 22  GLUCOSE 100* 99 95 108* 115* 100* 92  BUN 49* 44* 35* 30* 30* 29* 33*  CREATININE 4.77* 3.94* 3.35* 3.11* 3.15* 3.10* 3.14*  CALCIUM 11.0* 10.8* 11.1* 10.3 11.0* 10.7* 11.2*  PHOS 5.1* 4.5  --   --  4.4 4.6 5.4*   CBC Recent Labs  Lab 01/24/20 0523 01/24/20 1930 01/25/20 0331 01/25/20 0331 01/26/20 0640 01/26/20 0640 01/27/20 0513 01/28/20 0610 01/29/20 0321 01/30/20 0417  WBC 7.9  --  8.8  --  8.7  --   --   --   --  10.8*  NEUTROABS  --   --   --   --   --   --   --   --   --  5.0  HGB 6.7*   < > 9.6*   < > 9.7*   < > 9.7* 8.8* 8.8* 8.5*  HCT 20.9*   < > 29.4*   < > 30.4*   < > 29.8* 27.6* 27.0* 26.8*  MCV 88.9  --  84.2  --  84.0  --   --   --   --  87.0  PLT 138*  --  129*  --  123*  --   --   --   --  110*   < > = values in this interval not displayed.   Medications:    . hydrALAZINE  100 mg Oral TID  . levothyroxine  88 mcg Oral Q0600  . NIFEdipine  30 mg Oral QHS  . rosuvastatin  10 mg Oral QHS   Elmarie Shiley, MD 01/30/2020, 11:58 AM

## 2020-01-30 NOTE — Procedures (Signed)
Interventional Radiology Procedure Note  Procedure: CT guided aspirate and core biopsy of right posterior iliac bone Complications: None Recommendations: - Bedrest supine x 1 hrs - OTC's PRN  Pain - Follow biopsy results  Signed,  Charlii Yost S. Terin Cragle, DO    

## 2020-01-31 LAB — MULTIPLE MYELOMA PANEL, SERUM
Albumin SerPl Elph-Mcnc: 3.1 g/dL (ref 2.9–4.4)
Albumin/Glob SerPl: 1.1 (ref 0.7–1.7)
Alpha 1: 0.3 g/dL (ref 0.0–0.4)
Alpha2 Glob SerPl Elph-Mcnc: 0.7 g/dL (ref 0.4–1.0)
B-Globulin SerPl Elph-Mcnc: 0.9 g/dL (ref 0.7–1.3)
Gamma Glob SerPl Elph-Mcnc: 1.1 g/dL (ref 0.4–1.8)
Globulin, Total: 3.1 g/dL (ref 2.2–3.9)
IgA: 80 mg/dL (ref 64–422)
IgG (Immunoglobin G), Serum: 1093 mg/dL (ref 586–1602)
IgM (Immunoglobulin M), Srm: 27 mg/dL (ref 26–217)
M Protein SerPl Elph-Mcnc: 0.5 g/dL — ABNORMAL HIGH
Total Protein ELP: 6.2 g/dL (ref 6.0–8.5)

## 2020-01-31 LAB — CBC
HCT: 25.5 % — ABNORMAL LOW (ref 36.0–46.0)
Hemoglobin: 8.1 g/dL — ABNORMAL LOW (ref 12.0–15.0)
MCH: 27.6 pg (ref 26.0–34.0)
MCHC: 31.8 g/dL (ref 30.0–36.0)
MCV: 87 fL (ref 80.0–100.0)
Platelets: 113 10*3/uL — ABNORMAL LOW (ref 150–400)
RBC: 2.93 MIL/uL — ABNORMAL LOW (ref 3.87–5.11)
RDW: 15.9 % — ABNORMAL HIGH (ref 11.5–15.5)
WBC: 9.6 10*3/uL (ref 4.0–10.5)
nRBC: 2.1 % — ABNORMAL HIGH (ref 0.0–0.2)

## 2020-01-31 LAB — RENAL FUNCTION PANEL
Albumin: 3 g/dL — ABNORMAL LOW (ref 3.5–5.0)
Anion gap: 11 (ref 5–15)
BUN: 38 mg/dL — ABNORMAL HIGH (ref 8–23)
CO2: 20 mmol/L — ABNORMAL LOW (ref 22–32)
Calcium: 10.8 mg/dL — ABNORMAL HIGH (ref 8.9–10.3)
Chloride: 104 mmol/L (ref 98–111)
Creatinine, Ser: 3.4 mg/dL — ABNORMAL HIGH (ref 0.44–1.00)
GFR calc Af Amer: 14 mL/min — ABNORMAL LOW (ref >60)
GFR calc non Af Amer: 12 mL/min — ABNORMAL LOW (ref >60)
Glucose, Bld: 108 mg/dL — ABNORMAL HIGH (ref 70–99)
Phosphorus: 5.1 mg/dL — ABNORMAL HIGH (ref 2.5–4.6)
Potassium: 3.9 mmol/L (ref 3.5–5.1)
Sodium: 135 mmol/L (ref 135–145)

## 2020-01-31 LAB — SARS CORONAVIRUS 2 (TAT 6-24 HRS): SARS Coronavirus 2: NEGATIVE

## 2020-01-31 NOTE — Progress Notes (Signed)
Physical Therapy Treatment Patient Details Name: Carolyn Myers MRN: [id number] DOB: 1936-01-13 Today's Date: 01/31/2020    History of Present Illness Pt is an 84 yo female with CKD stage III, bilateral renal artery stenosis, HTN, and gout, who presented with worsening of right hip pain, and AKI following abnormal bloodwork at PCP on 2/1.    PT Comments    Pt limited by pain during session, reporting rib pain of 10/10. Pt is able to ambulate short household distances with use of RW, and continues to require physical assistance with bed mobility and transfers due to LE weakness. Pt will benefit from continued acute PT POC to improve activity tolerance, strength, and to reduce physical assistance requirements during functional mobility. PT continues to recommend SNF although pt could return home with assistance for all functional mobility, a RW, and HHPT, if this better aligns with pt goals.   Follow Up Recommendations  SNF;Supervision/Assistance - 24 hour(could progress to home health but needs assist for all mobil)     Equipment Recommendations  Rolling walker with 5" wheels    Recommendations for Other Services       Precautions / Restrictions Precautions Precautions: Fall Restrictions Weight Bearing Restrictions: No    Mobility  Bed Mobility Overal bed mobility: Needs Assistance Bed Mobility: Supine to Sit;Sit to Supine     Supine to sit: Mod assist Sit to supine: Min assist      Transfers Overall transfer level: Needs assistance Equipment used: Rolling walker (2 wheeled) Transfers: Sit to/from Stand Sit to Stand: Min assist            Ambulation/Gait Ambulation/Gait assistance: Min guard Gait Distance (Feet): 30 Feet Assistive device: Rolling walker (2 wheeled) Gait Pattern/deviations: Step-to pattern Gait velocity: reduced Gait velocity interpretation: <1.8 ft/sec, indicate of risk for recurrent falls General Gait Details: pt with short step to  pattern, reduced stride length and gait speed   Stairs             Wheelchair Mobility    Modified Rankin (Stroke Patients Only)       Balance Overall balance assessment: Needs assistance Sitting-balance support: No upper extremity supported;Feet supported Sitting balance-Leahy Scale: Good Sitting balance - Comments: supervision   Standing balance support: Bilateral upper extremity supported Standing balance-Leahy Scale: Fair Standing balance comment: close supervision with BUE support of RW                            Cognition Arousal/Alertness: Awake/alert Behavior During Therapy: WFL for tasks assessed/performed Overall Cognitive Status: Within Functional Limits for tasks assessed                                        Exercises      General Comments General comments (skin integrity, edema, etc.): vss on RA      Pertinent Vitals/Pain Pain Assessment: 0-10 Pain Score: 10-Worst pain ever Pain Location: ribs Pain Descriptors / Indicators: Aching Pain Intervention(s): Limited activity within patient's tolerance;Patient requesting pain meds-RN notified    Home Living                      Prior Function            PT Goals (current goals can now be found in the care plan section) Acute Rehab PT Goals Patient Stated Goal: to  improve mobility and get stronger Progress towards PT goals: Not progressing toward goals - comment(limited by pain)    Frequency    Min 2X/week      PT Plan Current plan remains appropriate    Co-evaluation              AM-PAC PT "6 Clicks" Mobility   Outcome Measure  Help needed turning from your back to your side while in a flat bed without using bedrails?: A Little Help needed moving from lying on your back to sitting on the side of a flat bed without using bedrails?: A Lot Help needed moving to and from a bed to a chair (including a wheelchair)?: A Little Help needed standing  up from a chair using your arms (e.g., wheelchair or bedside chair)?: A Little Help needed to walk in hospital room?: A Little Help needed climbing 3-5 steps with a railing? : A Lot 6 Click Score: 16    End of Session Equipment Utilized During Treatment: (none) Activity Tolerance: Patient limited by pain Patient left: in bed;with call bell/phone within reach;with bed alarm set Nurse Communication: Mobility status PT Visit Diagnosis: Difficulty in walking, not elsewhere classified (R26.2);Muscle weakness (generalized) (M62.81)     Time: [id number] PT Time Calculation (min) (ACUTE ONLY): 16 min  Charges:  $Gait Training: 8-22 mins                     Zenaida Niece, PT, DPT Acute Rehabilitation Pager: [phone]    Zenaida Niece 01/31/2020, 9:23 AM

## 2020-01-31 NOTE — Progress Notes (Addendum)
PROGRESS NOTE    [name]  ZDG:[id number] DOB: [birth year] DOA: 01/23/2020 PCP: Darreld Mclean, MD     Brief Narrative:  Carolyn Myers is an 84 y.o.femalewith medical history significant ofCKD stage III, bilateral renal artery stenosis,chronic right hip pain, hypertension, gout, thyroidism, presented with worsening of right hip pain, and AKI. Patient was recently treated for E. coli UTI, by her PCP on 01/17/2020 withAugmentin.Before that she was evaluated by orthopedic surgery for worsening of her right hip pain and was placed on Tylenol 3, and image study showed no significant right hip pathology but some lumbar vertebral arthritis.He went to see her PCP yesterday and blood work showed significant elevation of creatinine level from her baseline 1.92 to5.3.She denied taking any NSAIDs on a regular basis. Regarding the right hip pain, she said the nature has been aching and sometimes shooting down to the backsideof her right leg,she has been using a walker for ambulation.  She was admitted for acute kidney injury, nephrology consulted and appreciated.  Patient noted to have UTI and has completed Augmentin therapy prior to admission.  Only complaining of right hip and back pain.  Patient noted to have a hemoglobin of 6.7 and 2 units of PRBC have been transfused. Pending renal biopsy 2/5 and bone marrow biopsy 2/9.   New events last 24 hours / Subjective: Complains of some chronic bilateral rib pain.  Assessment & Plan:   Active Problems:   AKI (acute kidney injury) ([place])   Multiple myeloma not having achieved remission ([place])   Acute kidney injury on chronic kidney disease, stage IIIb -Baseline creatinine ranging 1.7-1.9. Creatinine on admission 5.18 -Renal ultrasound shows no obstructive pathology -Nephrology following, pending renal biopsy results 2/5 preliminary results showed MIDD consistent with presumed diagnosis of myeloma  Hypercalcemia -PTH 24 (WNL)  and PTHrP pending -Kappa free chain 49.9; lamda free light chains 12252.6 -Mspike 0.6 -25-hydroxy vitamin D 20.67 (low) -Bone marrow biopsy 2/9 pending results -Given calcitonin  Anemia of chronic disease -S/p 2 units PRBC transfused 2/3  -Continue to monitor CBC  Recurrent UTI -Patient recently completed Augmentin -UA showed few bacteria, 11-20 WBC, large leukocytes, negative nitrites -Urine culture showed no growth. Stop antibiotics.   Right hip pain/ back pain -?if due to the above vs MM -Bone survey: Multiple small rounded lucencies are noted in the visualized skull which may represent venous lakes, but lytic lesions related to multiple myeloma metastatic disease cannot be excluded.  Lytic destructive lesion seen in the proximal right fibular shaft consistent with metastatic disease or malignancy -Oncology consulted, bone marrow biopsy completed 2/9   Gout -Uric acid 17.1  Essential Hypertension -ARB held acute kidney injury -Continue hydralazine, nifedipine   Hypothyroidism -Continue Synthroid  HLD -Continue crestor    DVT prophylaxis: SCDs  Code Status: DNR Family Communication: None at bedside Disposition Plan: Patient is from home prior to admission.  She has been treated inpatient due to AKI on CKD, underwent renal and bone marrow biopsy.  SNF placement and insurance authorization is pending at this time.   Consultants:   Nephrology  IR  Oncology  Procedures:   Left renal biopsy 2/5   Bone marrow biopsy right posterior iliac bone 2/9  Antimicrobials:  Anti-infectives (From admission, onward)   Start     Dose/Rate Route Frequency Ordered Stop   01/23/20 1930  cefTRIAXone (ROCEPHIN) 1 g in sodium chloride 0.9 % 100 mL IVPB     1 g 200 mL/hr over 30 Minutes Intravenous  Every 24 hours 01/23/20 1919 01/27/20 1911        Objective: Vitals:   01/30/20 2002 01/30/20 2142 01/31/20 0454 01/31/20 0918  BP: 105/88 (!) 111/35 (!) 129/43 (!)  130/49  Pulse: (!) 57 (!) 51 (!) 56 (!) 59  Resp: 18  18   Temp: 98.8 F (37.1 C)  98.6 F (37 C) 98.2 F (36.8 C)  TempSrc: Oral  Oral Oral  SpO2: 94%  95% 98%  Weight:   77.6 kg   Height:        Intake/Output Summary (Last 24 hours) at 01/31/2020 1015 Last data filed at 01/31/2020 0500 Gross per 24 hour  Intake 340 ml  Output 700 ml  Net -360 ml   Filed Weights   01/29/20 0352 01/30/20 0352 01/31/20 0454  Weight: 73.3 kg 78.3 kg 77.6 kg    Examination:  General exam: Appears calm and comfortable  Respiratory system: Clear to auscultation. Respiratory effort normal. No respiratory distress. No conversational dyspnea.  Cardiovascular system: S1 & S2 heard, RRR. + systolic murmur. No pedal edema. Gastrointestinal system: Abdomen is nondistended, soft and nontender. Normal bowel sounds heard. Central nervous system: Alert and oriented. No focal neurological deficits. Speech clear.  Extremities: Symmetric in appearance  Skin: No rashes, lesions or ulcers on exposed skin  Psychiatry: Judgement and insight appear normal. Mood & affect appropriate.   Data Reviewed: I have personally reviewed following labs and imaging studies  CBC: Recent Labs  Lab 01/25/20 0331 01/25/20 0331 01/26/20 0640 01/26/20 0640 01/27/20 0513 01/28/20 0610 01/29/20 0321 01/30/20 0417 01/31/20 0544  WBC 8.8  --  8.7  --   --   --   --  10.8* 9.6  NEUTROABS  --   --   --   --   --   --   --  5.0  --   HGB 9.6*   < > 9.7*   < > 9.7* 8.8* 8.8* 8.5* 8.1*  HCT 29.4*   < > 30.4*   < > 29.8* 27.6* 27.0* 26.8* 25.5*  MCV 84.2  --  84.0  --   --   --   --  87.0 87.0  PLT 129*  --  123*  --   --   --   --  110* 113*   < > = values in this interval not displayed.   Basic Metabolic Panel: Recent Labs  Lab 01/25/20 0331 01/26/20 0640 01/27/20 0513 01/28/20 0610 01/29/20 0321 01/30/20 0417 01/31/20 0544  NA 137   < > 139 139 139 136 135  K 3.3*   < > 4.0 4.2 4.1 4.1 3.9  CL 102   < > 106 108 107  100 104  CO2 24   < > 21* 20* 20* 22 20*  GLUCOSE 99   < > 108* 115* 100* 92 108*  BUN 44*   < > 30* 30* 29* 33* 38*  CREATININE 3.94*   < > 3.11* 3.15* 3.10* 3.14* 3.40*  CALCIUM 10.8*   < > 10.3 11.0* 10.7* 11.2* 10.8*  MG 2.2  --   --   --   --   --   --   PHOS 4.5  --   --  4.4 4.6 5.4* 5.1*   < > = values in this interval not displayed.   GFR: Estimated Creatinine Clearance: 12.8 mL/min (A) (by C-G formula based on SCr of 3.4 mg/dL (H)). Liver Function Tests: Recent Labs  Lab 01/25/20 [phone]  01/28/20 0610 01/29/20 0321 01/30/20 0417 01/31/20 0544  ALBUMIN 2.9* 3.1* 3.0* 3.0* 3.0*   No results for input(s): LIPASE, AMYLASE in the last 168 hours. No results for input(s): AMMONIA in the last 168 hours. Coagulation Profile: Recent Labs  Lab 01/26/20 0640  INR 1.0   Cardiac Enzymes: No results for input(s): CKTOTAL, CKMB, CKMBINDEX, TROPONINI in the last 168 hours. BNP (last 3 results) No results for input(s): PROBNP in the last 8760 hours. HbA1C: No results for input(s): HGBA1C in the last 72 hours. CBG: No results for input(s): GLUCAP in the last 168 hours. Lipid Profile: No results for input(s): CHOL, HDL, LDLCALC, TRIG, CHOLHDL, LDLDIRECT in the last 72 hours. Thyroid Function Tests: No results for input(s): TSH, T4TOTAL, FREET4, T3FREE, THYROIDAB in the last 72 hours. Anemia Panel: No results for input(s): VITAMINB12, FOLATE, FERRITIN, TIBC, IRON, RETICCTPCT in the last 72 hours. Sepsis Labs: No results for input(s): PROCALCITON, LATICACIDVEN in the last 168 hours.  Recent Results (from the past 240 hour(s))  SARS CORONAVIRUS 2 (TAT 6-24 HRS) Nasopharyngeal Nasopharyngeal Swab     Status: None   Collection Time: 01/23/20  3:06 PM   Specimen: Nasopharyngeal Swab  Result Value Ref Range Status   SARS Coronavirus 2 NEGATIVE NEGATIVE Final    Comment: (NOTE) SARS-CoV-2 target nucleic acids are NOT DETECTED. The SARS-CoV-2 RNA is generally detectable in upper and  lower respiratory specimens during the acute phase of infection. Negative results do not preclude SARS-CoV-2 infection, do not rule out co-infections with other pathogens, and should not be used as the sole basis for treatment or other patient management decisions. Negative results must be combined with clinical observations, patient history, and epidemiological information. The expected result is Negative. Fact Sheet for Patients: SugarRoll.be Fact Sheet for Healthcare Providers: https://www.woods-mathews.com/ This test is not yet approved or cleared by the [place] FDA and  has been authorized for detection and/or diagnosis of SARS-CoV-2 by FDA under an Emergency Use Authorization (EUA). This EUA will remain  in effect (meaning this test can be used) for the duration of the COVID-19 declaration under Section 56 4(b)(1) of the Act, 21 U.S.C. section 360bbb-3(b)(1), unless the authorization is terminated or revoked sooner. Performed at [facility], [place] [address] [place], [place] [address]   Culture, Urine     Status: None   Collection Time: 01/23/20  6:54 PM   Specimen: Urine, Clean Catch  Result Value Ref Range Status   Specimen Description URINE, CLEAN CATCH  Final   Special Requests Normal  Final   Culture   Final    NO GROWTH Performed at [facility], [place] [address], [place],  [address]    Report Status 01/24/2020 FINAL  Final      Radiology Studies: CT BONE MARROW BIOPSY & ASPIRATION  Result Date: 01/30/2020 INDICATION: 84 year old female with a history multiple myeloma EXAM: CT BONE MARROW BIOPSY AND ASPIRATION MEDICATIONS: None. ANESTHESIA/SEDATION: Moderate (conscious) sedation was employed during this procedure. A total of Versed 0.5 mg and Fentanyl 50 mcg was administered intravenously. Moderate Sedation Time: 10 minutes. The patient's level of consciousness and vital signs were monitored continuously  by radiology nursing throughout the procedure under my direct supervision. FLUOROSCOPY TIME:  CT COMPLICATIONS: None PROCEDURE: The procedure risks, benefits, and alternatives were explained to the patient. Questions regarding the procedure were encouraged and answered. The patient understands and consents to the procedure. Scout CT of the pelvis was performed for surgical planning purposes. The  right posterior pelvis was prepped with Chlorhexidine in a sterile fashion, and a sterile drape was applied covering the operative field. A sterile gown and sterile gloves were used for the procedure. Local anesthesia was provided with 1% Lidocaine. Right posterior iliac bone was targeted for biopsy. The skin and subcutaneous tissues were infiltrated with 1% lidocaine without epinephrine. A small stab incision was made with an 11 blade scalpel, and an 11 gauge Murphy needle was advanced with CT guidance to the posterior cortex. Manual forced was used to advance the needle through the posterior cortex and the stylet was removed. A bone marrow aspirate was retrieved and passed to a cytotechnologist in the room. The Murphy needle was then advanced without the stylet for a core biopsy. The core biopsy was retrieved and also passed to a cytotechnologist. Manual pressure was used for hemostasis and a sterile dressing was placed. No complications were encountered no significant blood loss was encountered. Patient tolerated the procedure well and remained hemodynamically stable throughout. IMPRESSION: Status post CT-guided bone marrow biopsy, with tissue specimen sent to pathology for complete histopathologic analysis Signed, Dulcy Fanny. Earleen Newport, DO [facility] Radiology Electronically Signed   By: Corrie Mckusick D.O.   On: 01/30/2020 10:31      Scheduled Meds: . hydrALAZINE  100 mg Oral TID  . levothyroxine  88 mcg Oral Q0600  . NIFEdipine  30 mg Oral QHS  . rosuvastatin  10 mg  Oral QHS   Continuous Infusions:   LOS: 8 days      Time spent: 40 minutes   Dessa Phi, DO [facility] 01/31/2020, 10:15 AM   Available via Epic secure chat 7am-7pm After these hours, please refer to coverage provider listed on [email or web address]

## 2020-01-31 NOTE — TOC Progression Note (Addendum)
Transition of Care [facility]) - Progression Note    Patient Details  Name: Carolyn Myers MRN: [id number] Date of Birth: 02/23/1936  Transition of Care [facility]) CM/SW [place],  Phone Number: [phone] 01/31/2020, 9:45 AM  Clinical Narrative:     CSW informed by MD patient nearing medical readiness to dc to [facility], CSW has re started insurance auth with [facility] today all clinicals have been faxed, updated covid test has been ordered. CSW followed up with [facility] who reports they have a bed avail for dc tomorrow morning pending Covid test results and insurance auth.    Expected Discharge Plan: [place] Barriers to Discharge: Continued Medical Work up  Expected Discharge Plan and Services Expected Discharge Plan: [place] Choice: [place] arrangements for the past 2 months: Single Family Home                                       Social Determinants of Health (SDOH) Interventions    Readmission Risk Interventions No flowsheet data found.

## 2020-01-31 NOTE — Progress Notes (Signed)
Patient ID: Carolyn Myers, female   DOB: [birth year], 84 y.o.   MRN: [id number] [facility] Progress Note   Assessment/ Plan:   1. Acute kidney Injury on chronic kidney disease stage III versus progression: Baseline creatinine ranging 1.7-1.9 and now with acute kidney injury that appears to be consistent with injury from multiple myeloma (lambda light chain).  Preliminary report of renal biopsy showed MIDD that appears to be consistent with her presumptive diagnosis of myeloma (pending confirmation by bone marrow biopsy). Creatinine today higher which indeed may be the effect of her hypercalcemia.  No acute electrolyte abnormalities or indications for dialysis. 2.  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  Improving with calcitonin. 3.  Multiple myeloma: Based on presence of M spike, elevated lambda light chains and lytic lesions on both surveillance.  Underwent bone marrow biopsy yesterday, results pending. 4.  Hypertension: Blood pressure improving with addition of nifedipine XL.  Subjective:   Without acute events overnight, denies any chest pain or shortness of breath.   Objective:   BP (!) 129/43 (BP Location: Right Arm)   Pulse (!) 56   Temp 98.6 F (37 C) (Oral)   Resp 18   Ht '5\' 3"'$  (1.6 m)   Wt 77.6 kg   SpO2 95%   BMI 30.30 kg/m   Intake/Output Summary (Last 24 hours) at 01/31/2020 0744 Last data filed at 01/31/2020 0500 Gross per 24 hour  Intake 340 ml  Output 700 ml  Net -360 ml   Weight change: -0.7 kg  Physical Exam: Gen: Comfortably sleeping in bed CVS: Pulse regular rhythm, normal rate, S1 and S2 normal Resp: Clear to auscultation, no rales/rhonchi Abd: Soft, obese, nontender Ext: No lower extremity edema  Imaging: CT BONE MARROW BIOPSY & ASPIRATION  Result Date: 01/30/2020 INDICATION: 84 year old female with a history multiple myeloma EXAM: CT BONE MARROW  BIOPSY AND ASPIRATION MEDICATIONS: None. ANESTHESIA/SEDATION: Moderate (conscious) sedation was employed during this procedure. A total of Versed 0.5 mg and Fentanyl 50 mcg was administered intravenously. Moderate Sedation Time: 10 minutes. The patient's level of consciousness and vital signs were monitored continuously by radiology nursing throughout the procedure under my direct supervision. FLUOROSCOPY TIME:  CT COMPLICATIONS: None PROCEDURE: The procedure risks, benefits, and alternatives were explained to the patient. Questions regarding the procedure were encouraged and answered. The patient understands and consents to the procedure. Scout CT of the pelvis was performed for surgical planning purposes. The right posterior pelvis was prepped with Chlorhexidine in a sterile fashion, and a sterile drape was applied covering the operative field. A sterile gown and sterile gloves were used for the procedure. Local anesthesia was provided with 1% Lidocaine. Right posterior iliac bone was targeted for biopsy. The skin and subcutaneous tissues were infiltrated with 1% lidocaine without epinephrine. A small stab incision was made with an 11 blade scalpel, and an 11 gauge Murphy needle was advanced with CT guidance to the posterior cortex. Manual forced was used to advance the needle through the posterior cortex and the stylet was removed. A bone marrow aspirate was retrieved and passed to a cytotechnologist in the room. The Murphy needle was then advanced without the stylet for a core biopsy. The core biopsy was retrieved and also passed to a cytotechnologist. Manual pressure was used for hemostasis and a sterile dressing was placed. No complications were encountered no significant blood loss was encountered. Patient tolerated the procedure well and remained hemodynamically stable  throughout. IMPRESSION: Status post CT-guided bone marrow biopsy, with tissue specimen sent to pathology for complete histopathologic  analysis Signed, Dulcy Fanny. Earleen Newport, DO [facility] Radiology Electronically Signed   By: Corrie Mckusick D.O.   On: 01/30/2020 10:31    Labs: BMET Recent Labs  Lab 01/25/20 0331 01/26/20 0640 01/27/20 0513 01/28/20 0610 01/29/20 0321 01/30/20 0417 01/31/20 0544  NA 137 139 139 139 139 136 135  K 3.3* 4.0 4.0 4.2 4.1 4.1 3.9  CL 102 108 106 108 107 100 104  CO2 24 21* 21* 20* 20* 22 20*  GLUCOSE 99 95 108* 115* 100* 92 108*  BUN 44* 35* 30* 30* 29* 33* 38*  CREATININE 3.94* 3.35* 3.11* 3.15* 3.10* 3.14* 3.40*  CALCIUM 10.8* 11.1* 10.3 11.0* 10.7* 11.2* 10.8*  PHOS 4.5  --   --  4.4 4.6 5.4* 5.1*   CBC Recent Labs  Lab 01/25/20 0331 01/25/20 0331 01/26/20 0640 01/26/20 0640 01/27/20 0513 01/28/20 0610 01/29/20 0321 01/30/20 0417  WBC 8.8  --  8.7  --   --   --   --  10.8*  NEUTROABS  --   --   --   --   --   --   --  5.0  HGB 9.6*   < > 9.7*   < > 9.7* 8.8* 8.8* 8.5*  HCT 29.4*   < > 30.4*   < > 29.8* 27.6* 27.0* 26.8*  MCV 84.2  --  84.0  --   --   --   --  87.0  PLT 129*  --  123*  --   --   --   --  110*   < > = values in this interval not displayed.   Medications:    . hydrALAZINE  100 mg Oral TID  . levothyroxine  88 mcg Oral Q0600  . NIFEdipine  30 mg Oral QHS  . rosuvastatin  10 mg Oral QHS   Elmarie Shiley, MD 01/31/2020, 7:44 AM

## 2020-01-31 NOTE — Progress Notes (Signed)
Paged [place] at [employer]. Please call daughter Veatrice Kells (info in chart) with results of renal biopsy, renal test results.  Paged at [employer].Marland KitchenUpdate: Daughter Vonda Antigua wants results of bone marrow biopsy.   Upon secure message from [place], results not yet available and will contact patient/family. Oncology will also contact patient about results. Patient informed.

## 2020-01-31 NOTE — Plan of Care (Signed)

## 2020-02-01 DIAGNOSIS — M6281 Muscle weakness (generalized): Secondary | ICD-10-CM | POA: Diagnosis not present

## 2020-02-01 DIAGNOSIS — N179 Acute kidney failure, unspecified: Secondary | ICD-10-CM | POA: Diagnosis not present

## 2020-02-01 DIAGNOSIS — R488 Other symbolic dysfunctions: Secondary | ICD-10-CM | POA: Diagnosis not present

## 2020-02-01 DIAGNOSIS — I959 Hypotension, unspecified: Secondary | ICD-10-CM | POA: Diagnosis not present

## 2020-02-01 DIAGNOSIS — C9000 Multiple myeloma not having achieved remission: Secondary | ICD-10-CM | POA: Diagnosis not present

## 2020-02-01 DIAGNOSIS — E785 Hyperlipidemia, unspecified: Secondary | ICD-10-CM | POA: Diagnosis not present

## 2020-02-01 DIAGNOSIS — I129 Hypertensive chronic kidney disease with stage 1 through stage 4 chronic kidney disease, or unspecified chronic kidney disease: Secondary | ICD-10-CM | POA: Diagnosis not present

## 2020-02-01 DIAGNOSIS — E039 Hypothyroidism, unspecified: Secondary | ICD-10-CM | POA: Diagnosis not present

## 2020-02-01 DIAGNOSIS — R52 Pain, unspecified: Secondary | ICD-10-CM | POA: Diagnosis not present

## 2020-02-01 DIAGNOSIS — R5383 Other fatigue: Secondary | ICD-10-CM | POA: Diagnosis not present

## 2020-02-01 DIAGNOSIS — D631 Anemia in chronic kidney disease: Secondary | ICD-10-CM | POA: Diagnosis not present

## 2020-02-01 DIAGNOSIS — N390 Urinary tract infection, site not specified: Secondary | ICD-10-CM | POA: Diagnosis not present

## 2020-02-01 DIAGNOSIS — Z7982 Long term (current) use of aspirin: Secondary | ICD-10-CM | POA: Diagnosis not present

## 2020-02-01 DIAGNOSIS — R498 Other voice and resonance disorders: Secondary | ICD-10-CM | POA: Diagnosis not present

## 2020-02-01 DIAGNOSIS — M2550 Pain in unspecified joint: Secondary | ICD-10-CM | POA: Diagnosis not present

## 2020-02-01 DIAGNOSIS — H04123 Dry eye syndrome of bilateral lacrimal glands: Secondary | ICD-10-CM | POA: Diagnosis not present

## 2020-02-01 DIAGNOSIS — M25551 Pain in right hip: Secondary | ICD-10-CM | POA: Diagnosis not present

## 2020-02-01 DIAGNOSIS — I10 Essential (primary) hypertension: Secondary | ICD-10-CM | POA: Diagnosis not present

## 2020-02-01 DIAGNOSIS — R2681 Unsteadiness on feet: Secondary | ICD-10-CM | POA: Diagnosis not present

## 2020-02-01 DIAGNOSIS — R831 Abnormal level of hormones in cerebrospinal fluid: Secondary | ICD-10-CM | POA: Diagnosis not present

## 2020-02-01 DIAGNOSIS — R319 Hematuria, unspecified: Secondary | ICD-10-CM | POA: Diagnosis not present

## 2020-02-01 DIAGNOSIS — N183 Chronic kidney disease, stage 3 unspecified: Secondary | ICD-10-CM | POA: Diagnosis not present

## 2020-02-01 DIAGNOSIS — Z7401 Bed confinement status: Secondary | ICD-10-CM | POA: Diagnosis not present

## 2020-02-01 DIAGNOSIS — D649 Anemia, unspecified: Secondary | ICD-10-CM | POA: Diagnosis not present

## 2020-02-01 DIAGNOSIS — D6959 Other secondary thrombocytopenia: Secondary | ICD-10-CM | POA: Diagnosis not present

## 2020-02-01 DIAGNOSIS — Z79899 Other long term (current) drug therapy: Secondary | ICD-10-CM | POA: Diagnosis not present

## 2020-02-01 DIAGNOSIS — N189 Chronic kidney disease, unspecified: Secondary | ICD-10-CM | POA: Diagnosis not present

## 2020-02-01 LAB — BASIC METABOLIC PANEL
Anion gap: 10 (ref 5–15)
BUN: 37 mg/dL — ABNORMAL HIGH (ref 8–23)
CO2: 21 mmol/L — ABNORMAL LOW (ref 22–32)
Calcium: 11.2 mg/dL — ABNORMAL HIGH (ref 8.9–10.3)
Chloride: 103 mmol/L (ref 98–111)
Creatinine, Ser: 3.29 mg/dL — ABNORMAL HIGH (ref 0.44–1.00)
GFR calc Af Amer: 15 mL/min — ABNORMAL LOW (ref >60)
GFR calc non Af Amer: 13 mL/min — ABNORMAL LOW (ref >60)
Glucose, Bld: 102 mg/dL — ABNORMAL HIGH (ref 70–99)
Potassium: 4.2 mmol/L (ref 3.5–5.1)
Sodium: 134 mmol/L — ABNORMAL LOW (ref 135–145)

## 2020-02-01 LAB — CBC
HCT: 25.8 % — ABNORMAL LOW (ref 36.0–46.0)
Hemoglobin: 8.3 g/dL — ABNORMAL LOW (ref 12.0–15.0)
MCH: 27.5 pg (ref 26.0–34.0)
MCHC: 32.2 g/dL (ref 30.0–36.0)
MCV: 85.4 fL (ref 80.0–100.0)
Platelets: 118 10*3/uL — ABNORMAL LOW (ref 150–400)
RBC: 3.02 MIL/uL — ABNORMAL LOW (ref 3.87–5.11)
RDW: 15.9 % — ABNORMAL HIGH (ref 11.5–15.5)
WBC: 11 10*3/uL — ABNORMAL HIGH (ref 4.0–10.5)
nRBC: 2 % — ABNORMAL HIGH (ref 0.0–0.2)

## 2020-02-01 LAB — PTH-RELATED PEPTIDE: PTH-related peptide: <2 pmol/L

## 2020-02-01 MED ORDER — NIFEDIPINE ER 30 MG PO TB24: 30.0000 mg | ORAL_TABLET | Freq: Every day | ORAL | 0 refills | Status: DC

## 2020-02-01 MED ORDER — SODIUM CHLORIDE 0.9 % IV SOLN: INTRAVENOUS | Status: DC

## 2020-02-01 MED ORDER — SODIUM CHLORIDE 0.9 % IV SOLN: INTRAVENOUS | Status: AC

## 2020-02-01 MED ORDER — DENOSUMAB 120 MG/1.7ML ~~LOC~~ SOLN
120.0000 mg | Freq: Once | SUBCUTANEOUS | Status: AC
  Administered 2020-02-01: 15:00:00 120 mg via SUBCUTANEOUS
  Filled 2020-02-01: qty 1.7

## 2020-02-01 MED ORDER — OXYCODONE HCL 5 MG PO TABS: 5.0000 mg | ORAL_TABLET | ORAL | 0 refills | Status: DC | PRN

## 2020-02-01 NOTE — TOC Transition Note (Addendum)
Transition of Care [facility]) - CM/SW Discharge Note   Patient Details  Name: SAMMI APRAHAMIAN MRN: [id number] Date of Birth: [birth year]  Transition of Care [facility]) CM/SW Contact:  Alberteen Sam, [place] Phone Number: [phone] 02/01/2020, 1:03 PM   Clinical Narrative:     Patient will DC to: [facility] [place] Anticipated DC date: 02/01/2020 Family notified: Vonda Antigua Transport [phone]  Per MD patient ready for DC to [facility] . RN, patient, patient's family, and facility notified of DC. Discharge Summary sent to facility. RN given number for report  [phone] ask for 700 hall nurse (patient will go to room 708). DC packet on chart. Ambulance transport will be requested for patient for 4:30 pm per RN request. [place] signing off.  [place], [place]   Final next level of care: Skilled Nursing Facility Barriers to Discharge: No Barriers Identified   Patient Goals and CMS Choice Patient states their goals for this hospitalization and ongoing recovery are:: to go to rehab before home CMS Medicare.gov Compare Post Acute Care list provided to:: Patient Represenative (must comment)(daughter Elssa) Choice offered to / list presented to : Adult Children  Discharge Placement PASRR number recieved: 01/25/20            Patient chooses bed at: [facility] [place] Patient to be transferred to facility by: [place] Name of family member notified: Elssa Patient and family notified of of transfer: 02/01/20  Discharge Plan and Services     Post Acute Care Choice: [place]                               Social Determinants of Health (SDOH) Interventions     Readmission Risk Interventions No flowsheet data found.

## 2020-02-01 NOTE — Discharge Summary (Signed)
Physician Discharge Summary  Carolyn Myers TIW:[id number] DOB: 1936-10-31 DOA: 01/23/2020  PCP: Darreld Mclean, MD  Admit date: 01/23/2020 Discharge date: 02/01/2020  Admitted From: Home Disposition:  SNF   Recommendations for Outpatient Follow-up:  1. Follow up with PCP in 1 week 2. Follow up with Oncology regarding bone marrow biopsy result 3. Follow up with [facility] Dr. Posey Pronto 02/28/2020 4. Please repeat BMP to follow up on Cr and Ca levels after discharge   Discharge Condition: Stable CODE STATUS: DNR  Diet recommendation: Regular diet   Brief/Interim Summary: Carolyn Myers is an 84 y.o.femalewith medical history significant ofCKD stage III, bilateral renal artery stenosis,chronic right hip pain, hypertension, gout, thyroidism, presented with worsening of right hip pain, and AKI. Patient was recently treated for E. coli UTI, by her PCP on 01/17/2020 withAugmentin.Before that she was evaluated by orthopedic surgery for worsening of her right hip pain and was placed on Tylenol 3, and image study showed no significant right hip pathology but some lumbar vertebral arthritis. She went to see her PCP yesterday and blood work showed significant elevation of creatinine level from her baseline 1.92 to5.3.She denied taking any NSAIDs on a regular basis. Regarding the right hip pain, she said the nature has been aching and sometimes shooting down to the backsideof her right leg,she has been using a walker for ambulation.  She was admitted for acute kidney injury, nephrology consulted and appreciated. Patient noted to have UTI and has completed Augmentin therapy prior to admission. Only complaining of right hip and back pain. Patient noted to have a hemoglobin of 6.7 and 2 units of PRBC have been transfused. Pending renal biopsy 2/5 and bone marrow biopsy 2/9.   Discharge Diagnoses:  Active Problems:   AKI (acute kidney injury) ([place])   Multiple myeloma  not having achieved remission ([place])    Acute kidney injury on chronic kidney disease, stage IIIb -Baseline creatinine ranging 1.7-1.9. Creatinine on admission 5.18 -Renal ultrasound shows no obstructive pathology -Nephrology consulted, pending renal biopsy results 2/5 preliminary results showed MIDD consistent with presumed diagnosis of myeloma  Hypercalcemia -PTH 24 (WNL) and PTHrP ordered 2/2 pending -Kappa free chain 49.9; lamda free light chains 12252.6 -Mspike 0.6 -25-hydroxy vitamin D 20.67 (low) -Bone marrow biopsy 2/9 pending results -Given calcitonin -Given IVF and Xgeva prior to discharge 2/11  -Cannot give bisphosphonate due to renal function   Anemia of chronic disease -S/p 2 units PRBC transfused 2/3  -Continue to monitor CBC  Recurrent UTI -Patient recently completed Augmentin -UA showed few bacteria, 11-20 WBC, large leukocytes, negative nitrites -Urine culture showed no growth. Stop antibiotics.   Right hip pain/ back pain -?if due to the above vs MM -Bone survey: Multiple small rounded lucencies are noted in the visualized skull which may represent venous lakes, but lytic lesions related to multiple myeloma metastatic disease cannot be excluded. Lytic destructive lesion seen in the proximal right fibular shaft consistent with metastatic disease or malignancy -Oncology consulted, bone marrow biopsy completed 2/9   Gout -Uric acid 17.1  Essential Hypertension -ARB held acute kidney injury -Continue hydralazine, nifedipine   Hypothyroidism -Continue Synthroid  HLD -Continue crestor    Discharge Instructions  Discharge Instructions    Diet general   Complete by: As directed    Increase activity slowly   Complete by: As directed      Allergies as of 02/01/2020      Reactions   Pineapple Swelling, Other (See Comments)   Reaction  to fresh pineapple - tongue swells, but breathing is not affected      Medication List    STOP taking these  medications   acetaminophen-codeine 300-30 MG tablet Commonly known as: TYLENOL #3   amLODipine 10 MG tablet Commonly known as: NORVASC   losartan 50 MG tablet Commonly known as: COZAAR   meclizine 12.5 MG tablet Commonly known as: ANTIVERT   nitroGLYCERIN 0.4 MG SL tablet Commonly known as: NITROSTAT   ondansetron 4 MG tablet Commonly known as: Zofran   ranitidine 300 MG tablet Commonly known as: ZANTAC     TAKE these medications   aspirin 81 MG EC tablet Take 1 tablet (81 mg total) by mouth daily.   [employer] LUBRICANT EYE DROPS PF OP Place 1-2 drops into both eyes 3 (three) times daily as needed (for dry eyes).   diclofenac Sodium 1 % Gel Commonly known as: VOLTAREN Apply 2 g topically 4 (four) times daily. Use as needed for painful joints.  Max 32 grams per day What changed:   when to take this  reasons to take this  additional instructions   hydrALAZINE 100 MG tablet Commonly known as: APRESOLINE TAKE 1 TABLET BY MOUTH 3 TIMES DAILY. MONITOR YOUR BLOOD PRESSURE, HOLD FOR BP 100/55 AND PULSE 55 What changed: See the new instructions.   levothyroxine 88 MCG tablet Commonly known as: SYNTHROID TAKE 1 TABLET BY MOUTH DAILY BEFORE BREAKFAST. What changed:   how much to take  how to take this  when to take this  additional instructions   NIFEdipine 30 MG 24 hr tablet Commonly known as: ADALAT CC Take 1 tablet (30 mg total) by mouth at bedtime.   oxyCODONE 5 MG immediate release tablet Commonly known as: Roxicodone Take 1 tablet (5 mg total) by mouth every 4 (four) hours as needed for severe pain.   rosuvastatin 10 MG tablet Commonly known as: CRESTOR TAKE 1 TABLET BY MOUTH EVERY DAY What changed: when to take this      Follow-up Information    Elmarie Shiley, MD. Go on 02/28/2020.   Specialty: Nephrology Why: At 1 PM Contact information: [place] [address] [phone]        Copland, Gay Filler, MD. Schedule an appointment as soon  as possible for a visit in 1 week(s).   Specialty: Family Medicine Contact information: [address] [place] [address] [place] ([address]). Schedule an appointment as soon as possible for a visit in 1 week(s).   Contact information: [place] [id number] [place] [address] [phone]         Allergies  Allergen Reactions  . Pineapple Swelling and Other (See Comments)    Reaction to fresh pineapple - tongue swells, but breathing is not affected    Consultations:  Nephrology  IR  Oncology   Procedures/Studies: DG Chest 2 View  Result Date: 01/17/2020 CLINICAL DATA:  84 year old female with weakness and malaise for 1 week. Back pain and urinary symptoms. EXAM: CHEST - 2 VIEW COMPARISON:  08/22/2018 chest radiographs and earlier. FINDINGS: AP and lateral views. Lung volumes and mediastinal contours are stable and within normal limits. Visualized tracheal air column is within normal limits. Both lungs appear clear. No pneumothorax or pleural effusion. No acute osseous abnormality identified. Negative visible bowel gas pattern. IMPRESSION: No acute cardiopulmonary abnormality. Electronically Signed   By: Herminio Heads.D.  On: 01/17/2020 16:07   US Renal  Result Date: 01/23/2020 CLINICAL DATA:  Right-sided flank pain for 2 weeks EXAM: RENAL / URINARY TRACT ULTRASOUND COMPLETE COMPARISON:  02/21/2016 FINDINGS: Right Kidney: Renal measurements: 10.6 x 5.0 by 5.1 cm = volume: 141 mL. Diffuse increased renal cortical echotexture and decreased corticomedullary differentiation, compatible with medical renal disease. Multifocal areas of cortical thinning and scarring. Multiple small simple renal cysts measuring up to 1.5 cm. Left Kidney: Renal measurements: 9.5 x 5.9 x 5.0 cm = volume: 148 mL. Diffuse increased renal cortical echotexture and decreased corticomedullary differentiation, compatible  with medical renal disease. Bladder: Appears normal for degree of bladder distention. Other: No evidence of urinary tract calculi or obstructive uropathy. IMPRESSION: 1. Increased renal cortical echotexture consistent with medical renal disease. 2. Chronic right renal cortical thinning and scarring, not appreciably changed since prior CT exam. 3. Small simple cysts right kidney. Electronically Signed   By: Randa Ngo M.D.   On: 01/23/2020 15:49   DG Bone Survey Met  Result Date: 01/25/2020 CLINICAL DATA:  Low back pain.  Chronic kidney disease stage 3. EXAM: METASTATIC BONE SURVEY COMPARISON:  None. FINDINGS: Multiple small rounded lucencies are noted in the visualized skull which may represent venous lakes, but lytic lesions cannot be excluded. There is noted a lytic destructive lesion seen involving the proximal right fibular shaft concerning for metastatic disease or malignancy. No other focal abnormality is noted. IMPRESSION: Multiple small rounded lucencies are noted in the visualized skull which may represent venous lakes, but lytic lesions related to multiple myeloma metastatic disease cannot be excluded. Also noted is lytic destructive lesion seen involving the proximal right fibular shaft consistent with metastatic disease or malignancy. Electronically Signed   By: Marijo Conception M.D.   On: 01/25/2020 11:27   CT BONE MARROW BIOPSY & ASPIRATION  Result Date: 01/30/2020 INDICATION: 84 year old female with a history multiple myeloma EXAM: CT BONE MARROW BIOPSY AND ASPIRATION MEDICATIONS: None. ANESTHESIA/SEDATION: Moderate (conscious) sedation was employed during this procedure. A total of Versed 0.5 mg and Fentanyl 50 mcg was administered intravenously. Moderate Sedation Time: 10 minutes. The patient's level of consciousness and vital signs were monitored continuously by radiology nursing throughout the procedure under my direct supervision. FLUOROSCOPY TIME:  CT COMPLICATIONS: None PROCEDURE: The  procedure risks, benefits, and alternatives were explained to the patient. Questions regarding the procedure were encouraged and answered. The patient understands and consents to the procedure. Scout CT of the pelvis was performed for surgical planning purposes. The right posterior pelvis was prepped with Chlorhexidine in a sterile fashion, and a sterile drape was applied covering the operative field. A sterile gown and sterile gloves were used for the procedure. Local anesthesia was provided with 1% Lidocaine. Right posterior iliac bone was targeted for biopsy. The skin and subcutaneous tissues were infiltrated with 1% lidocaine without epinephrine. A small stab incision was made with an 11 blade scalpel, and an 11 gauge Murphy needle was advanced with CT guidance to the posterior cortex. Manual forced was used to advance the needle through the posterior cortex and the stylet was removed. A bone marrow aspirate was retrieved and passed to a cytotechnologist in the room. The Murphy needle was then advanced without the stylet for a core biopsy. The core biopsy was retrieved and also passed to a cytotechnologist. Manual pressure was used for hemostasis and a sterile dressing was placed. No complications were encountered no significant blood loss was encountered. Patient tolerated the procedure well and  remained hemodynamically stable throughout. IMPRESSION: Status post CT-guided bone marrow biopsy, with tissue specimen sent to pathology for complete histopathologic analysis Signed, Dulcy Fanny. Earleen Newport, DO [facility] Electronically Signed   By: Corrie Mckusick D.O.   On: 01/30/2020 10:31   US BIOPSY (KIDNEY)  Result Date: 01/26/2020 INDICATION: 84 year old female with acute on chronic kidney disease concerning for possible amyloidosis or multiple myeloma. EXAM: ULTRASOUND GUIDED RENAL BIOPSY COMPARISON:  None. MEDICATIONS: Fentanyl 50 mcg IV; Versed 1 mg IV  ANESTHESIA/SEDATION: The patient's vital signs and level of consciousness were monitored continuously by radiology nursing under my direct supervision. Total Moderate Sedation time Thirteen minutes COMPLICATIONS: None immediate PROCEDURE: Informed written consent was obtained from the patient after a discussion of the risks, benefits and alternatives to treatment. The patient understands and consents the procedure. A timeout was performed prior to the initiation of the procedure. Ultrasound scanning was performed of the bilateral flanks. The inferior pole of the left kidney was selected for biopsy due to location and sonographic window. The procedure was planned. The operative site was prepped and draped in the usual sterile fashion. The overlying soft tissues were anesthetized with 1% lidocaine with epinephrine. A 17 gauge core needle biopsy device was advanced into the inferior cortex of the left kidney and 3 core biopsies were obtained under direct ultrasound guidance. Images were saved for documentation purposes. The biopsy device was removed and hemostasis was obtained with manual compression. Post procedural scanning was negative for significant post procedural hemorrhage or additional complication. A dressing was placed. The patient tolerated the procedure well without immediate post procedural complication. IMPRESSION: Technically successful ultrasound guided left renal biopsy. Electronically Signed   By: Jacqulynn Cadet M.D.   On: 01/26/2020 15:27   DG HIP UNILAT W OR W/O PELVIS 2-3 VIEWS RIGHT  Result Date: 01/22/2020 CLINICAL DATA:  Right hip pain, no history of injury, pain for 3 weeks EXAM: DG HIP (WITH OR WITHOUT PELVIS) 2-3V RIGHT COMPARISON:  None. FINDINGS: Frontal view of the pelvis as well as frontal and frogleg lateral views of the right hip are obtained. No acute displaced fracture. Joint spaces are well preserved. There is prominent spondylosis within the lower lumbar spine greatest at  L4-5 and L5-S1. Sacroiliac joints appear normal. IMPRESSION: 1. Unremarkable pelvis and right hip. 2. Lower lumbar spondylosis. Electronically Signed   By: Randa Ngo M.D.   On: 01/22/2020 16:37       Discharge Exam: Vitals:   02/01/20 0420 02/01/20 0923  BP: (!) 146/42 129/60  Pulse: 62 63  Resp: 16   Temp: 98.8 F (37.1 C) 98.1 F (36.7 C)  SpO2: 94% 98%    General: Pt is alert, awake, not in acute distress Cardiovascular: RRR, S1/S2 +, no edema Respiratory: CTA bilaterally, no wheezing, no rhonchi, no respiratory distress, no conversational dyspnea  Abdominal: Soft, NT, ND, bowel sounds + Extremities: no edema, no cyanosis Psych: Normal mood and affect, stable judgement and insight     The results of significant diagnostics from this hospitalization (including imaging, microbiology, ancillary and laboratory) are listed below for reference.     Microbiology: Recent Results (from the past 240 hour(s))  SARS CORONAVIRUS 2 (TAT 6-24 HRS) Nasopharyngeal Nasopharyngeal Swab     Status: None   Collection Time: 01/23/20  3:06 PM   Specimen: Nasopharyngeal Swab  Result Value Ref Range Status   SARS Coronavirus 2 NEGATIVE NEGATIVE Final    Comment: (NOTE) SARS-CoV-2 target nucleic acids  are NOT DETECTED. The SARS-CoV-2 RNA is generally detectable in upper and lower respiratory specimens during the acute phase of infection. Negative results do not preclude SARS-CoV-2 infection, do not rule out co-infections with other pathogens, and should not be used as the sole basis for treatment or other patient management decisions. Negative results must be combined with clinical observations, patient history, and epidemiological information. The expected result is Negative. Fact Sheet for Patients: SugarRoll.be Fact Sheet for Healthcare Providers: https://www.woods-mathews.com/ This test is not yet approved or cleared by the [place] FDA  and  has been authorized for detection and/or diagnosis of SARS-CoV-2 by FDA under an Emergency Use Authorization (EUA). This EUA will remain  in effect (meaning this test can be used) for the duration of the COVID-19 declaration under Section 56 4(b)(1) of the Act, 21 U.S.C. section 360bbb-3(b)(1), unless the authorization is terminated or revoked sooner. Performed at [facility], [place] [address] [place], [place] [address]   Culture, Urine     Status: None   Collection Time: 01/23/20  6:54 PM   Specimen: Urine, Clean Catch  Result Value Ref Range Status   Specimen Description URINE, CLEAN CATCH  Final   Special Requests Normal  Final   Culture   Final    NO GROWTH Performed at [facility], [place] [address], [place], [place] [address]    Report Status 01/24/2020 FINAL  Final  SARS CORONAVIRUS 2 (TAT 6-24 HRS) Nasopharyngeal Nasopharyngeal Swab     Status: None   Collection Time: 01/31/20  6:22 PM   Specimen: Nasopharyngeal Swab  Result Value Ref Range Status   SARS Coronavirus 2 NEGATIVE NEGATIVE Final    Comment: (NOTE) SARS-CoV-2 target nucleic acids are NOT DETECTED. The SARS-CoV-2 RNA is generally detectable in upper and lower respiratory specimens during the acute phase of infection. Negative results do not preclude SARS-CoV-2 infection, do not rule out co-infections with other pathogens, and should not be used as the sole basis for treatment or other patient management decisions. Negative results must be combined with clinical observations, patient history, and epidemiological information. The expected result is Negative. Fact Sheet for Patients: SugarRoll.be Fact Sheet for Healthcare Providers: https://www.woods-mathews.com/ This test is not yet approved or cleared by the [place] FDA and  has been authorized for detection and/or diagnosis of SARS-CoV-2 by FDA under an Emergency Use Authorization (EUA). This EUA  will remain  in effect (meaning this test can be used) for the duration of the COVID-19 declaration under Section 56 4(b)(1) of the Act, 21 U.S.C. section 360bbb-3(b)(1), unless the authorization is terminated or revoked sooner. Performed at [facility], [place] [address] [place], [place] [address]      Labs: BNP (last 3 results) No results for input(s): BNP in the last 8760 hours. Basic Metabolic Panel: Recent Labs  Lab 01/28/20 0610 01/29/20 0321 01/30/20 0417 01/31/20 0544 02/01/20 0415  NA 139 139 136 135 134*  K 4.2 4.1 4.1 3.9 4.2  CL 108 107 100 104 103  CO2 20* 20* 22 20* 21*  GLUCOSE 115* 100* 92 108* 102*  BUN 30* 29* 33* 38* 37*  CREATININE 3.15* 3.10* 3.14* 3.40* 3.29*  CALCIUM 11.0* 10.7* 11.2* 10.8* 11.2*  PHOS 4.4 4.6 5.4* 5.1*  --    Liver Function Tests: Recent Labs  Lab 01/28/20 0610 01/29/20 0321 01/30/20 0417 01/31/20 0544  ALBUMIN 3.1* 3.0* 3.0* 3.0*   No results for input(s): LIPASE, AMYLASE in the last 168  hours. No results for input(s): AMMONIA in the last 168 hours. CBC: Recent Labs  Lab 01/26/20 0640 01/27/20 0513 01/28/20 0610 01/29/20 0321 01/30/20 0417 01/31/20 0544 02/01/20 0415  WBC 8.7  --   --   --  10.8* 9.6 11.0*  NEUTROABS  --   --   --   --  5.0  --   --   HGB 9.7*   < > 8.8* 8.8* 8.5* 8.1* 8.3*  HCT 30.4*   < > 27.6* 27.0* 26.8* 25.5* 25.8*  MCV 84.0  --   --   --  87.0 87.0 85.4  PLT 123*  --   --   --  110* 113* 118*   < > = values in this interval not displayed.   Cardiac Enzymes: No results for input(s): CKTOTAL, CKMB, CKMBINDEX, TROPONINI in the last 168 hours. BNP: Invalid input(s): POCBNP CBG: No results for input(s): GLUCAP in the last 168 hours. D-Dimer No results for input(s): DDIMER in the last 72 hours. Hgb A1c No results for input(s): HGBA1C in the last 72 hours. Lipid Profile No results for input(s): CHOL, HDL, LDLCALC, TRIG, CHOLHDL, LDLDIRECT in the last 72 hours. Thyroid function  studies No results for input(s): TSH, T4TOTAL, T3FREE, THYROIDAB in the last 72 hours.  Invalid input(s): FREET3 Anemia work up No results for input(s): VITAMINB12, FOLATE, FERRITIN, TIBC, IRON, RETICCTPCT in the last 72 hours. Urinalysis    Component Value Date/Time   COLORURINE YELLOW 01/23/2020 1855   APPEARANCEUR CLOUDY (A) 01/23/2020 1855   LABSPEC 1.012 01/23/2020 1855   PHURINE 8.0 01/23/2020 1855   GLUCOSEU NEGATIVE 01/23/2020 1855   HGBUR NEGATIVE 01/23/2020 1855   BILIRUBINUR NEGATIVE 01/23/2020 1855   BILIRUBINUR negative 01/17/2020 1535   KETONESUR NEGATIVE 01/23/2020 1855   PROTEINUR 30 (A) 01/23/2020 1855   UROBILINOGEN 0.2 01/17/2020 1535   NITRITE NEGATIVE 01/23/2020 1855   LEUKOCYTESUR LARGE (A) 01/23/2020 1855   Sepsis Labs Invalid input(s): PROCALCITONIN,  WBC,  LACTICIDVEN Microbiology Recent Results (from the past 240 hour(s))  SARS CORONAVIRUS 2 (TAT 6-24 HRS) Nasopharyngeal Nasopharyngeal Swab     Status: None   Collection Time: 01/23/20  3:06 PM   Specimen: Nasopharyngeal Swab  Result Value Ref Range Status   SARS Coronavirus 2 NEGATIVE NEGATIVE Final    Comment: (NOTE) SARS-CoV-2 target nucleic acids are NOT DETECTED. The SARS-CoV-2 RNA is generally detectable in upper and lower respiratory specimens during the acute phase of infection. Negative results do not preclude SARS-CoV-2 infection, do not rule out co-infections with other pathogens, and should not be used as the sole basis for treatment or other patient management decisions. Negative results must be combined with clinical observations, patient history, and epidemiological information. The expected result is Negative. Fact Sheet for Patients: SugarRoll.be Fact Sheet for Healthcare Providers: https://www.woods-mathews.com/ This test is not yet approved or cleared by the [place] FDA and  has been authorized for detection and/or diagnosis of  SARS-CoV-2 by FDA under an Emergency Use Authorization (EUA). This EUA will remain  in effect (meaning this test can be used) for the duration of the COVID-19 declaration under Section 56 4(b)(1) of the Act, 21 U.S.C. section 360bbb-3(b)(1), unless the authorization is terminated or revoked sooner. Performed at [facility], [place] [address] [place], [place] [address]   Culture, Urine     Status: None   Collection Time: 01/23/20  6:54 PM   Specimen: Urine, Clean Catch  Result Value Ref Range Status   Specimen  Description URINE, CLEAN CATCH  Final   Special Requests Normal  Final   Culture   Final    NO GROWTH Performed at [facility], [place] [address], [place], [place] [address]    Report Status 01/24/2020 FINAL  Final  SARS CORONAVIRUS 2 (TAT 6-24 HRS) Nasopharyngeal Nasopharyngeal Swab     Status: None   Collection Time: 01/31/20  6:22 PM   Specimen: Nasopharyngeal Swab  Result Value Ref Range Status   SARS Coronavirus 2 NEGATIVE NEGATIVE Final    Comment: (NOTE) SARS-CoV-2 target nucleic acids are NOT DETECTED. The SARS-CoV-2 RNA is generally detectable in upper and lower respiratory specimens during the acute phase of infection. Negative results do not preclude SARS-CoV-2 infection, do not rule out co-infections with other pathogens, and should not be used as the sole basis for treatment or other patient management decisions. Negative results must be combined with clinical observations, patient history, and epidemiological information. The expected result is Negative. Fact Sheet for Patients: SugarRoll.be Fact Sheet for Healthcare Providers: https://www.woods-mathews.com/ This test is not yet approved or cleared by the [place] FDA and  has been authorized for detection and/or diagnosis of SARS-CoV-2 by FDA under an Emergency Use Authorization (EUA). This EUA will remain  in effect (meaning this test can be used) for  the duration of the COVID-19 declaration under Section 56 4(b)(1) of the Act, 21 U.S.C. section 360bbb-3(b)(1), unless the authorization is terminated or revoked sooner. Performed at [facility], [place] [address] [place],  [address]      Patient was seen and examined on the day of discharge and was found to be in stable condition. Time coordinating discharge: 40 minutes including assessment and coordination of care, as well as examination of the patient.   SIGNED:  Dessa Phi, DO [facility] 02/01/2020, 10:19 AM

## 2020-02-01 NOTE — Progress Notes (Signed)
Spoke with pharm at 1200 regarding safe admin of med and admin directions for Denosumab.   Paged Choi at 1214. Please call back regarding Denosumab injection. thank you! Upon callback informed of adv effects and patient no longer on tele, requested DO discuss adverse effects with patient and daughter; will reorder tele. Assisted patient with using phone in room to receive call from Leonard.  At 1228 callback from [facility] stating she received verbal consent from patient and daughter to administer Denosumab. Maylene Roes stated also reviewed side effects.

## 2020-02-01 NOTE — Progress Notes (Signed)
Malini A Faddis to be D/C'd Skilled nursing facility per MD order.  Discussed with the patient and all questions fully answered.  VSS, Skin clean, dry and intact without evidence of skin break down, no evidence of skin tears noted. IV catheter discontinued intact. Site without signs and symptoms of complications. Dressing and pressure applied.  An After Visit Summary was printed and given to the patient/PTAR for facility. Patient received prescription.  D/c education completed with patient/family including follow up instructions, medication list, d/c activities limitations if indicated, with other d/c instructions as indicated by MD - patient able to verbalize understanding, all questions fully answered.   Patient instructed to return to ED, call 911, or call MD for any changes in condition.   Patient escorted via PTAR.   Howard Pouch 02/01/2020 6:55 PM

## 2020-02-01 NOTE — Progress Notes (Addendum)
Patient ID: Carolyn Myers, female   DOB: [birth year], 84 y.o.   MRN: [id number]  [facility] Progress Note   Assessment/ Plan:   1. Acute kidney Injury on chronic kidney disease stage III versus progression: Baseline creatinine ranging 1.7-1.9 and now with acute kidney injury that appears to be consistent with injury from multiple myeloma (lambda light chain)-she will likely establish a higher baseline creatinine.  Preliminary report of renal biopsy showed MIDD that appears to be consistent with her presumptive diagnosis of myeloma (pending confirmation by bone marrow biopsy).  Renal function relatively stable without acute indication for dialysis. 2.  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  Calcium slightly higher today, will give half liter normal saline and order calcitonin if does not get xgeva. 3.  Multiple myeloma: Based on presence of M spike, elevated lambda light chains and lytic lesions on both surveillance.  Underwent bone marrow biopsy yesterday, results pending. 4.  Hypertension: Blood pressure improving with addition of nifedipine XL.  Subjective:   Reports that she is feeling fine, denies any chest pain or shortness of breath.   Objective:   BP (!) 146/42 (BP Location: Right Arm)   Pulse 62   Temp 98.8 F (37.1 C) (Oral)   Resp 16   Ht 5' 3" (1.6 m)   Wt 72.9 kg   SpO2 94%   BMI 28.47 kg/m   Intake/Output Summary (Last 24 hours) at 02/01/2020 2694 Last data filed at 02/01/2020 0423 Gross per 24 hour  Intake 560 ml  Output 600 ml  Net -40 ml   Weight change: -4.7 kg  Physical Exam: Gen: Comfortably sitting up in bed CVS: Pulse regular rhythm, normal rate, S1 and S2 normal Resp: Clear to auscultation, no rales/rhonchi Abd: Soft, obese, nontender Ext: 1+ right lower extremity edema, no edema left lower extremity  Imaging: CT BONE MARROW BIOPSY &  ASPIRATION  Result Date: 01/30/2020 INDICATION: 84 year old female with a history multiple myeloma EXAM: CT BONE MARROW BIOPSY AND ASPIRATION MEDICATIONS: None. ANESTHESIA/SEDATION: Moderate (conscious) sedation was employed during this procedure. A total of Versed 0.5 mg and Fentanyl 50 mcg was administered intravenously. Moderate Sedation Time: 10 minutes. The patient's level of consciousness and vital signs were monitored continuously by radiology nursing throughout the procedure under my direct supervision. FLUOROSCOPY TIME:  CT COMPLICATIONS: None PROCEDURE: The procedure risks, benefits, and alternatives were explained to the patient. Questions regarding the procedure were encouraged and answered. The patient understands and consents to the procedure. Scout CT of the pelvis was performed for surgical planning purposes. The right posterior pelvis was prepped with Chlorhexidine in a sterile fashion, and a sterile drape was applied covering the operative field. A sterile gown and sterile gloves were used for the procedure. Local anesthesia was provided with 1% Lidocaine. Right posterior iliac bone was targeted for biopsy. The skin and subcutaneous tissues were infiltrated with 1% lidocaine without epinephrine. A small stab incision was made with an 11 blade scalpel, and an 11 gauge Murphy needle was advanced with CT guidance to the posterior cortex. Manual forced was used to advance the needle through the posterior cortex and the stylet was removed. A bone marrow aspirate was retrieved and passed to a cytotechnologist in the room. The Murphy needle was then advanced without the stylet for a core biopsy. The core biopsy was retrieved and also passed to a cytotechnologist. Manual pressure was used for hemostasis and a sterile dressing was placed.  No complications were encountered no significant blood loss was encountered. Patient tolerated the procedure well and remained hemodynamically stable throughout.  IMPRESSION: Status post CT-guided bone marrow biopsy, with tissue specimen sent to pathology for complete histopathologic analysis Signed, Dulcy Fanny. Earleen Newport, DO [facility] Electronically Signed   By: Corrie Mckusick D.O.   On: 01/30/2020 10:31    Labs: BMET Recent Labs  Lab 01/26/20 0640 01/27/20 0513 01/28/20 0610 01/29/20 0321 01/30/20 0417 01/31/20 0544 02/01/20 0415  NA 139 139 139 139 136 135 134*  K 4.0 4.0 4.2 4.1 4.1 3.9 4.2  CL 108 106 108 107 100 104 103  CO2 21* 21* 20* 20* 22 20* 21*  GLUCOSE 95 108* 115* 100* 92 108* 102*  BUN 35* 30* 30* 29* 33* 38* 37*  CREATININE 3.35* 3.11* 3.15* 3.10* 3.14* 3.40* 3.29*  CALCIUM 11.1* 10.3 11.0* 10.7* 11.2* 10.8* 11.2*  PHOS  --   --  4.4 4.6 5.4* 5.1*  --    CBC Recent Labs  Lab 01/26/20 0640 01/27/20 0513 01/29/20 0321 01/30/20 0417 01/31/20 0544 02/01/20 0415  WBC 8.7  --   --  10.8* 9.6 11.0*  NEUTROABS  --   --   --  5.0  --   --   HGB 9.7*   < > 8.8* 8.5* 8.1* 8.3*  HCT 30.4*   < > 27.0* 26.8* 25.5* 25.8*  MCV 84.0  --   --  87.0 87.0 85.4  PLT 123*  --   --  110* 113* 118*   < > = values in this interval not displayed.   Medications:    . hydrALAZINE  100 mg Oral TID  . levothyroxine  88 mcg Oral Q0600  . NIFEdipine  30 mg Oral QHS  . rosuvastatin  10 mg Oral QHS   Elmarie Shiley, MD 02/01/2020, 8:42 AM

## 2020-02-01 NOTE — Progress Notes (Signed)
Verdon Cummins RN assist with Denosumab administration at 1435.   Called report to [facility] at 1653. Called Kim RN. Informed of Denosumab given.

## 2020-02-02 ENCOUNTER — Telehealth: Payer: Self-pay | Admitting: *Deleted

## 2020-02-02 NOTE — Telephone Encounter (Signed)
LVM for pt to call office to schedule hospital follow up appt.  

## 2020-02-04 DIAGNOSIS — I10 Essential (primary) hypertension: Secondary | ICD-10-CM | POA: Diagnosis not present

## 2020-02-04 DIAGNOSIS — M25551 Pain in right hip: Secondary | ICD-10-CM | POA: Diagnosis not present

## 2020-02-04 DIAGNOSIS — N189 Chronic kidney disease, unspecified: Secondary | ICD-10-CM | POA: Diagnosis not present

## 2020-02-04 DIAGNOSIS — E039 Hypothyroidism, unspecified: Secondary | ICD-10-CM | POA: Diagnosis not present

## 2020-02-04 DIAGNOSIS — C9000 Multiple myeloma not having achieved remission: Secondary | ICD-10-CM | POA: Diagnosis not present

## 2020-02-05 ENCOUNTER — Telehealth: Payer: Self-pay | Admitting: Family Medicine

## 2020-02-05 DIAGNOSIS — C9000 Multiple myeloma not having achieved remission: Secondary | ICD-10-CM

## 2020-02-05 NOTE — Telephone Encounter (Signed)
I have made two attempts and have been unable to reach patient. I have mailed the patient a letter requesting they call the office to schedule a hospital follow up appointment.   

## 2020-02-05 NOTE — Telephone Encounter (Signed)
Pt's daughter calling requesting a call back states her mother got a call from the Hospital about lab results but her mom didn't understand what they were saying. Carolyn Myers is wanting to know if Dr. Lorelei Pont can call her and go over results .

## 2020-02-06 NOTE — Telephone Encounter (Signed)
Called patients daughter to get clarification on which labs she needed help with. She would like to know about her bone scan, bone and kidney biopsy. Could you please clarify on these? Im not really sure which ones contain her results as these are never ordered at our clinic.

## 2020-02-06 NOTE — Telephone Encounter (Signed)
Called her daughter- I reviewed BMBx results and per my understanding she likely has multiple myeloma.  I do not see her renal biopsy results back yet.  I placed a referral for her to see Dr[facility] En[facility].  Reminded them about nephrology appointment scheduled for March 10.  Asked her daughter to alert me if they do not hear from oncology by the end of this week, let me know what else I can do to help.  Carolyn Myers is currently in a rehab facility, she is feeling better 

## 2020-02-07 ENCOUNTER — Encounter: Payer: Self-pay | Admitting: *Deleted

## 2020-02-07 ENCOUNTER — Ambulatory Visit: Payer: Medicare HMO | Admitting: Family Medicine

## 2020-02-07 LAB — SURGICAL PATHOLOGY

## 2020-02-07 NOTE — Progress Notes (Signed)
Received referral for this patient. Upon chart review, saw that patient has also been referred to [facility] and that patient is currently residing in a rehab facility in [place], her daughter to get clarification on their needs for her mother. She stated that the patient was planning on staying at the rehab facility until approximately March 3rd, 2021. Her and her sister believed that she would initiate care at [facility] while at the rehab facility, but once discharged wished to be seen here, at [facility]. At this time, patient doesn't have an appointment at either location. Elssa was also going to speak to someone at the rehab to see if transport to this office would be possible so that care could be initiated here.  Will reach out to [facility] to inform them of patient plans. Since the patient will likely only be at rehab another two weeks, they may wish to just allow the patient to establish care here depending on the urgency of her needs.   Spoke to [place] at [facility] and patient will be seen there on Monday. We will address future needs when patient is discharged from rehab facility.

## 2020-02-08 ENCOUNTER — Encounter (HOSPITAL_COMMUNITY): Payer: Self-pay | Admitting: Oncology

## 2020-02-08 DIAGNOSIS — D649 Anemia, unspecified: Secondary | ICD-10-CM | POA: Diagnosis not present

## 2020-02-08 DIAGNOSIS — N189 Chronic kidney disease, unspecified: Secondary | ICD-10-CM | POA: Diagnosis not present

## 2020-02-08 DIAGNOSIS — I10 Essential (primary) hypertension: Secondary | ICD-10-CM | POA: Diagnosis not present

## 2020-02-08 DIAGNOSIS — E039 Hypothyroidism, unspecified: Secondary | ICD-10-CM | POA: Diagnosis not present

## 2020-02-08 DIAGNOSIS — M25551 Pain in right hip: Secondary | ICD-10-CM | POA: Diagnosis not present

## 2020-02-08 NOTE — Progress Notes (Signed)
[place]  [phone] [phone]  ID: Carolyn Myers OB: 05-13-1936  MR#: [id number]  ZYS#:[id number]  Patient Care Team: Darreld Mclean, MD as PCP - General (Family Medicine) Belva Crome, MD as PCP - Cardiology (Cardiology)  CHIEF COMPLAINT: Multiple Myeloma.  INTERVAL HISTORY: Patient is an 84 year old female who was recently discharged from the hospital to a skilled nursing facility for rehab.  All inpatient, she had a bone marrow biopsy confirming her diagnosis of multiple myeloma.  She has chronic weakness and fatigue, but states this is improving.  She has right lower leg pain, but otherwise feels well.  She has no neurologic complaints.  She denies any recent fevers.  She has a fair appetite, but denies weight loss.  She has no chest pain, shortness of breath, cough, or hemoptysis.  She denies any nausea, vomiting, constipation, or diarrhea.  She has no urinary complaints.  Patient otherwise feels well and offers no further specific complaints today.  REVIEW OF SYSTEMS:   Review of Systems  Constitutional: Positive for malaise/fatigue. Negative for fever and weight loss.  Respiratory: Negative.  Negative for cough, hemoptysis and shortness of breath.   Cardiovascular: Negative.  Negative for chest pain and leg swelling.  Gastrointestinal: Negative.  Negative for abdominal pain.  Genitourinary: Negative.  Negative for dysuria.  Musculoskeletal: Negative for back pain.       Right lower leg pain  Skin: Negative.  Negative for rash.  Neurological: Positive for weakness. Negative for dizziness, focal weakness and headaches.  Psychiatric/Behavioral: Negative.  The patient is not nervous/anxious and does not have insomnia.     As per HPI. Otherwise, a complete review of systems is negative.  PAST MEDICAL HISTORY: Past Medical History:  Diagnosis Date  . Arthritis    "minor in my shoulders" (02/21/2016)  . Chicken pox   .  Diverticulitis   . Frequent headaches    "maybe twice/week" (03/12/2016)  . Gout    Right Foot  . History of blood transfusion    Reported transfusion following hysterectomy  . History of gout   . Hyperlipidemia   . Hypertension   . Hypothyroidism   . Migraine    "maybe twice/year" (02/21/2016)  . Palpitations    "doctor thought it was related to my thyroid"  . Sleep apnea    "I don't wear my mask" (02/21/2016)    PAST SURGICAL HISTORY: Past Surgical History:  Procedure Laterality Date  . ABDOMINAL HYSTERECTOMY     "for fibroid tumors"  . TUBAL LIGATION      FAMILY HISTORY: Family History  Problem Relation Age of Onset  . Heart disease Mother   . Heart attack Mother        died from it  . Hypertension Mother   . Arthritis Mother   . Stroke Father   . Cancer Maternal Grandmother   . Heart attack Maternal Aunt   . Renal Disease Maternal Aunt   . Multiple myeloma Maternal Uncle   . Emphysema Maternal Uncle   . Heart disease Sister   . Thyroid disease Sister   . Healthy Son        x3  . Healthy Daughter        x5  . Sickle cell trait Grandchild     ADVANCED DIRECTIVES (Y/N):  N  HEALTH MAINTENANCE: Social History   Tobacco Use  . Smoking status: Never Smoker  . Smokeless tobacco: Never Used  Substance Use Topics  . Alcohol  use: No  . Drug use: No     Colonoscopy:  PAP:  Bone density:  Lipid panel:  Allergies  Allergen Reactions  . Pineapple Swelling and Other (See Comments)    Reaction to fresh pineapple - tongue swells, but breathing is not affected    Current Outpatient Medications  Medication Sig Dispense Refill  . aspirin EC 81 MG EC tablet Take 1 tablet (81 mg total) by mouth daily.    . diclofenac Sodium (VOLTAREN) 1 % GEL Apply 2 g topically 4 (four) times daily. Use as needed for painful joints.  Max 32 grams per day (Patient taking differently: Apply 2 g topically 4 (four) times daily as needed (to affected sites/painful joints (right  leg/hip) ("MAX 32 GRAMS/DAY")). ) 100 g 3  . hydrALAZINE (APRESOLINE) 100 MG tablet TAKE 1 TABLET BY MOUTH 3 TIMES DAILY. MONITOR YOUR BLOOD PRESSURE, HOLD FOR BP 100/55 AND PULSE 55 (Patient taking differently: Take 100 mg by mouth See admin instructions. Take 100 mg by mouth three times a day and MONITOR YOUR BLOOD PRESSURE- HOLD FOR B/P 100/55 AND PULSE 55) 270 tablet 1  . levothyroxine (SYNTHROID) 88 MCG tablet TAKE 1 TABLET BY MOUTH DAILY BEFORE BREAKFAST. (Patient taking differently: Take 88 mcg by mouth daily before breakfast. ) 90 tablet 1  . oxyCODONE (ROXICODONE) 5 MG immediate release tablet Take 1 tablet (5 mg total) by mouth every 4 (four) hours as needed for severe pain. 30 tablet 0  . rosuvastatin (CRESTOR) 10 MG tablet TAKE 1 TABLET BY MOUTH EVERY DAY (Patient taking differently: Take 10 mg by mouth at bedtime. ) 90 tablet 1  . Carboxymethylcellulose Sodium ([employer] LUBRICANT EYE DROPS PF OP) Place 1-2 drops into both eyes 3 (three) times daily as needed (for dry eyes).    Marland Kitchen NIFEdipine (ADALAT CC) 30 MG 24 hr tablet Take 1 tablet (30 mg total) by mouth at bedtime. 30 tablet 0   No current facility-administered medications for this visit.    OBJECTIVE: Vitals:   02/12/20 1044  BP: 140/65  Pulse: 69  Resp: 16  Temp: (!) 95.5 F (35.3 C)  SpO2: 98%     Body mass index is 29.05 kg/m.    ECOG FS:2 - Symptomatic, <50% confined to bed  General: Well-developed, well-nourished, no acute distress.  Sitting in a wheelchair. Eyes: Pink conjunctiva, anicteric sclera. HEENT: Normocephalic, moist mucous membranes. Lungs: No audible wheezing or coughing. Heart: Regular rate and rhythm. Abdomen: Soft, nontender, no obvious distention. Musculoskeletal: No edema, cyanosis, or clubbing. Neuro: Alert, answering all questions appropriately. Cranial nerves grossly intact. Skin: No rashes or petechiae noted. Psych: Normal affect. Lymphatics: No cervical, calvicular, axillary or inguinal  LAD.   LAB RESULTS:  Lab Results  Component Value Date   NA 134 (L) 02/12/2020   K 5.1 02/12/2020   CL 104 02/12/2020   CO2 20 (L) 02/12/2020   GLUCOSE 120 (H) 02/12/2020   BUN 50 (H) 02/12/2020   CREATININE 4.82 (H) 02/12/2020   CALCIUM 7.8 (L) 02/12/2020   PROT 7.4 01/17/2020   ALBUMIN 3.0 (L) 01/31/2020   AST 24 01/17/2020   ALT 6 01/17/2020   ALKPHOS 80 01/17/2020   BILITOT 0.6 01/17/2020   GFRNONAA 8 (L) 02/12/2020   GFRAA 9 (L) 02/12/2020    Lab Results  Component Value Date   WBC 9.4 02/12/2020   NEUTROABS 4.3 02/12/2020   HGB 7.3 (L) 02/12/2020   HCT 23.8 (L) 02/12/2020   MCV 87.8 02/12/2020  PLT 76 (L) 02/12/2020     STUDIES: DG Chest 2 View  Result Date: 01/17/2020 CLINICAL DATA:  84 year old female with weakness and malaise for 1 week. Back pain and urinary symptoms. EXAM: CHEST - 2 VIEW COMPARISON:  08/22/2018 chest radiographs and earlier. FINDINGS: AP and lateral views. Lung volumes and mediastinal contours are stable and within normal limits. Visualized tracheal air column is within normal limits. Both lungs appear clear. No pneumothorax or pleural effusion. No acute osseous abnormality identified. Negative visible bowel gas pattern. IMPRESSION: No acute cardiopulmonary abnormality. Electronically Signed   By: Genevie Ann M.D.   On: 01/17/2020 16:07   US Renal  Result Date: 01/23/2020 CLINICAL DATA:  Right-sided flank pain for 2 weeks EXAM: RENAL / URINARY TRACT ULTRASOUND COMPLETE COMPARISON:  02/21/2016 FINDINGS: Right Kidney: Renal measurements: 10.6 x 5.0 by 5.1 cm = volume: 141 mL. Diffuse increased renal cortical echotexture and decreased corticomedullary differentiation, compatible with medical renal disease. Multifocal areas of cortical thinning and scarring. Multiple small simple renal cysts measuring up to 1.5 cm. Left Kidney: Renal measurements: 9.5 x 5.9 x 5.0 cm = volume: 148 mL. Diffuse increased renal cortical echotexture and decreased  corticomedullary differentiation, compatible with medical renal disease. Bladder: Appears normal for degree of bladder distention. Other: No evidence of urinary tract calculi or obstructive uropathy. IMPRESSION: 1. Increased renal cortical echotexture consistent with medical renal disease. 2. Chronic right renal cortical thinning and scarring, not appreciably changed since prior CT exam. 3. Small simple cysts right kidney. Electronically Signed   By: Randa Ngo M.D.   On: 01/23/2020 15:49   DG Bone Survey Met  Result Date: 01/25/2020 CLINICAL DATA:  Low back pain.  Chronic kidney disease stage 3. EXAM: METASTATIC BONE SURVEY COMPARISON:  None. FINDINGS: Multiple small rounded lucencies are noted in the visualized skull which may represent venous lakes, but lytic lesions cannot be excluded. There is noted a lytic destructive lesion seen involving the proximal right fibular shaft concerning for metastatic disease or malignancy. No other focal abnormality is noted. IMPRESSION: Multiple small rounded lucencies are noted in the visualized skull which may represent venous lakes, but lytic lesions related to multiple myeloma metastatic disease cannot be excluded. Also noted is lytic destructive lesion seen involving the proximal right fibular shaft consistent with metastatic disease or malignancy. Electronically Signed   By: Marijo Conception M.D.   On: 01/25/2020 11:27   CT BONE MARROW BIOPSY & ASPIRATION  Result Date: 01/30/2020 INDICATION: 84 year old female with a history multiple myeloma EXAM: CT BONE MARROW BIOPSY AND ASPIRATION MEDICATIONS: None. ANESTHESIA/SEDATION: Moderate (conscious) sedation was employed during this procedure. A total of Versed 0.5 mg and Fentanyl 50 mcg was administered intravenously. Moderate Sedation Time: 10 minutes. The patient's level of consciousness and vital signs were monitored continuously by radiology nursing throughout the procedure under my direct supervision. FLUOROSCOPY  TIME:  CT COMPLICATIONS: None PROCEDURE: The procedure risks, benefits, and alternatives were explained to the patient. Questions regarding the procedure were encouraged and answered. The patient understands and consents to the procedure. Scout CT of the pelvis was performed for surgical planning purposes. The right posterior pelvis was prepped with Chlorhexidine in a sterile fashion, and a sterile drape was applied covering the operative field. A sterile gown and sterile gloves were used for the procedure. Local anesthesia was provided with 1% Lidocaine. Right posterior iliac bone was targeted for biopsy. The skin and subcutaneous tissues were infiltrated with 1% lidocaine without epinephrine. A small stab incision  was made with an 11 blade scalpel, and an 11 gauge Murphy needle was advanced with CT guidance to the posterior cortex. Manual forced was used to advance the needle through the posterior cortex and the stylet was removed. A bone marrow aspirate was retrieved and passed to a cytotechnologist in the room. The Murphy needle was then advanced without the stylet for a core biopsy. The core biopsy was retrieved and also passed to a cytotechnologist. Manual pressure was used for hemostasis and a sterile dressing was placed. No complications were encountered no significant blood loss was encountered. Patient tolerated the procedure well and remained hemodynamically stable throughout. IMPRESSION: Status post CT-guided bone marrow biopsy, with tissue specimen sent to pathology for complete histopathologic analysis Signed, Dulcy Fanny. Earleen Newport, DO [facility] Electronically Signed   By: Corrie Mckusick D.O.   On: 01/30/2020 10:31   US BIOPSY (KIDNEY)  Result Date: 01/26/2020 INDICATION: 84 year old female with acute on chronic kidney disease concerning for possible amyloidosis or multiple myeloma. EXAM: ULTRASOUND GUIDED RENAL BIOPSY COMPARISON:  None.  MEDICATIONS: Fentanyl 50 mcg IV; Versed 1 mg IV ANESTHESIA/SEDATION: The patient's vital signs and level of consciousness were monitored continuously by radiology nursing under my direct supervision. Total Moderate Sedation time Thirteen minutes COMPLICATIONS: None immediate PROCEDURE: Informed written consent was obtained from the patient after a discussion of the risks, benefits and alternatives to treatment. The patient understands and consents the procedure. A timeout was performed prior to the initiation of the procedure. Ultrasound scanning was performed of the bilateral flanks. The inferior pole of the left kidney was selected for biopsy due to location and sonographic window. The procedure was planned. The operative site was prepped and draped in the usual sterile fashion. The overlying soft tissues were anesthetized with 1% lidocaine with epinephrine. A 17 gauge core needle biopsy device was advanced into the inferior cortex of the left kidney and 3 core biopsies were obtained under direct ultrasound guidance. Images were saved for documentation purposes. The biopsy device was removed and hemostasis was obtained with manual compression. Post procedural scanning was negative for significant post procedural hemorrhage or additional complication. A dressing was placed. The patient tolerated the procedure well without immediate post procedural complication. IMPRESSION: Technically successful ultrasound guided left renal biopsy. Electronically Signed   By: Jacqulynn Cadet M.D.   On: 01/26/2020 15:27   DG HIP UNILAT W OR W/O PELVIS 2-3 VIEWS RIGHT  Result Date: 01/22/2020 CLINICAL DATA:  Right hip pain, no history of injury, pain for 3 weeks EXAM: DG HIP (WITH OR WITHOUT PELVIS) 2-3V RIGHT COMPARISON:  None. FINDINGS: Frontal view of the pelvis as well as frontal and frogleg lateral views of the right hip are obtained. No acute displaced fracture. Joint spaces are well preserved. There is prominent  spondylosis within the lower lumbar spine greatest at L4-5 and L5-S1. Sacroiliac joints appear normal. IMPRESSION: 1. Unremarkable pelvis and right hip. 2. Lower lumbar spondylosis. Electronically Signed   By: Randa Ngo M.D.   On: 01/22/2020 16:37    ASSESSMENT: Multiple Myeloma  PLAN:    1. Multiple Myeloma: Bone marrow biopsy results reviewed independently with 67 to 100% of her bone marrow replaced with plasma cell neoplasm.  Her immunoglobulins are all within normal limits, but her lambda light chains are incalculable.  Metastatic bone survey results reviewed independently and report as above with multiple lucencies consistent with multiple myeloma.  She also has a destructive lesion in her right fibula  likely causing her pain.  She has evidence of endorgan damage with worsening renal failure, anemia, and thrombocytopenia.  She recently had hypercalcemia, but this is since resolved.  She is moving back to [place] after discharge from rehab, therefore a referral was sent for evaluation and to initiate chemotherapy at [facility].  Given her leg pain from the destructive lesion noted, she will have follow-up with radiation oncology later this week to discuss palliative XRT.  No further intervention is needed at this time. 2.  Chronic renal failure: Patient's creatinine has trended up to 4.82.  Will repeat laboratory work when she returns for radiation oncology visit. 3.  Anemia: Patient's hemoglobin is trending down, likely secondary to her myeloma.  Repeat laboratory work as above. 4.  Thrombocytopenia: Platelet count is trending down secondary to her myeloma.  Repeat laboratory work as above. 5.  Hypercalcemia: Resolved.  Patient expressed understanding and was in agreement with this plan. She also understands that She can call clinic at any time with any questions, concerns, or complaints.   Cancer Staging No matching staging information was found for the patient.  Lloyd Huger, MD   02/13/2020 7:00 AM

## 2020-02-09 LAB — SURGICAL PATHOLOGY

## 2020-02-12 ENCOUNTER — Other Ambulatory Visit: Payer: Self-pay

## 2020-02-12 ENCOUNTER — Encounter: Payer: Self-pay | Admitting: Oncology

## 2020-02-12 ENCOUNTER — Inpatient Hospital Stay: Payer: No Typology Code available for payment source

## 2020-02-12 ENCOUNTER — Inpatient Hospital Stay: Payer: No Typology Code available for payment source | Attending: Oncology | Admitting: Oncology

## 2020-02-12 VITALS — BP 140/65 | HR 69 | Temp 95.5°F | Resp 16 | Wt 164.0 lb

## 2020-02-12 DIAGNOSIS — D6959 Other secondary thrombocytopenia: Secondary | ICD-10-CM | POA: Diagnosis not present

## 2020-02-12 DIAGNOSIS — D649 Anemia, unspecified: Secondary | ICD-10-CM | POA: Insufficient documentation

## 2020-02-12 DIAGNOSIS — C9000 Multiple myeloma not having achieved remission: Secondary | ICD-10-CM | POA: Diagnosis not present

## 2020-02-12 DIAGNOSIS — N183 Chronic kidney disease, stage 3 unspecified: Secondary | ICD-10-CM | POA: Diagnosis not present

## 2020-02-12 DIAGNOSIS — I129 Hypertensive chronic kidney disease with stage 1 through stage 4 chronic kidney disease, or unspecified chronic kidney disease: Secondary | ICD-10-CM | POA: Insufficient documentation

## 2020-02-12 DIAGNOSIS — E039 Hypothyroidism, unspecified: Secondary | ICD-10-CM | POA: Diagnosis not present

## 2020-02-12 LAB — BASIC METABOLIC PANEL
Anion gap: 10 (ref 5–15)
BUN: 50 mg/dL — ABNORMAL HIGH (ref 8–23)
CO2: 20 mmol/L — ABNORMAL LOW (ref 22–32)
Calcium: 7.8 mg/dL — ABNORMAL LOW (ref 8.9–10.3)
Chloride: 104 mmol/L (ref 98–111)
Creatinine, Ser: 4.82 mg/dL — ABNORMAL HIGH (ref 0.44–1.00)
GFR calc Af Amer: 9 mL/min — ABNORMAL LOW (ref >60)
GFR calc non Af Amer: 8 mL/min — ABNORMAL LOW (ref >60)
Glucose, Bld: 120 mg/dL — ABNORMAL HIGH (ref 70–99)
Potassium: 5.1 mmol/L (ref 3.5–5.1)
Sodium: 134 mmol/L — ABNORMAL LOW (ref 135–145)

## 2020-02-12 LAB — CBC WITH DIFFERENTIAL/PLATELET
Abs Immature Granulocytes: 0.6 10*3/uL — ABNORMAL HIGH (ref 0.00–0.07)
Basophils Absolute: 0 10*3/uL (ref 0.0–0.1)
Basophils Relative: 0 %
Eosinophils Absolute: 0.1 10*3/uL (ref 0.0–0.5)
Eosinophils Relative: 1 %
HCT: 23.8 % — ABNORMAL LOW (ref 36.0–46.0)
Hemoglobin: 7.3 g/dL — ABNORMAL LOW (ref 12.0–15.0)
Lymphocytes Relative: 27 %
Lymphs Abs: 2.5 10*3/uL (ref 0.7–4.0)
MCH: 26.9 pg (ref 26.0–34.0)
MCHC: 30.7 g/dL (ref 30.0–36.0)
MCV: 87.8 fL (ref 80.0–100.0)
Metamyelocytes Relative: 4 %
Monocytes Absolute: 1.9 10*3/uL — ABNORMAL HIGH (ref 0.1–1.0)
Monocytes Relative: 20 %
Myelocytes: 2 %
Neutro Abs: 4.3 10*3/uL (ref 1.7–7.7)
Neutrophils Relative %: 46 %
Platelets: 76 10*3/uL — ABNORMAL LOW (ref 150–400)
RBC: 2.71 MIL/uL — ABNORMAL LOW (ref 3.87–5.11)
RDW: 17.2 % — ABNORMAL HIGH (ref 11.5–15.5)
Smear Review: DECREASED
WBC Morphology: ABNORMAL
WBC: 9.4 10*3/uL (ref 4.0–10.5)
nRBC: 7.4 % — ABNORMAL HIGH (ref 0.0–0.2)

## 2020-02-12 NOTE — Progress Notes (Signed)
New patient visit she is doing well no complaints.

## 2020-02-13 LAB — IGG, IGA, IGM
IgA: 67 mg/dL (ref 64–422)
IgG (Immunoglobin G), Serum: 1069 mg/dL (ref 586–1602)
IgM (Immunoglobulin M), Srm: 20 mg/dL — ABNORMAL LOW (ref 26–217)

## 2020-02-13 LAB — PROTEIN ELECTROPHORESIS, SERUM
A/G Ratio: 1.1 (ref 0.7–1.7)
Albumin ELP: 3.5 g/dL (ref 2.9–4.4)
Alpha-1-Globulin: 0.4 g/dL (ref 0.0–0.4)
Alpha-2-Globulin: 0.7 g/dL (ref 0.4–1.0)
Beta Globulin: 0.9 g/dL (ref 0.7–1.3)
Gamma Globulin: 1.2 g/dL (ref 0.4–1.8)
Globulin, Total: 3.2 g/dL (ref 2.2–3.9)
M-Spike, %: 0.7 g/dL — ABNORMAL HIGH
Total Protein ELP: 6.7 g/dL (ref 6.0–8.5)

## 2020-02-13 LAB — KAPPA/LAMBDA LIGHT CHAINS
Kappa free light chain: 38.6 mg/L — ABNORMAL HIGH (ref 3.3–19.4)
Kappa, lambda light chain ratio: 0 — ABNORMAL LOW (ref 0.26–1.65)

## 2020-02-15 ENCOUNTER — Telehealth: Payer: Self-pay | Admitting: Family Medicine

## 2020-02-15 ENCOUNTER — Encounter: Payer: Self-pay | Admitting: Radiation Oncology

## 2020-02-15 ENCOUNTER — Other Ambulatory Visit: Payer: Self-pay

## 2020-02-15 ENCOUNTER — Ambulatory Visit
Admission: RE | Admit: 2020-02-15 | Discharge: 2020-02-15 | Disposition: A | Discharge status: Home / Self Care | Payer: No Typology Code available for payment source | Source: Ambulatory Visit | Attending: Radiation Oncology | Admitting: Radiation Oncology

## 2020-02-15 ENCOUNTER — Other Ambulatory Visit: Payer: Self-pay | Admitting: Oncology

## 2020-02-15 ENCOUNTER — Inpatient Hospital Stay: Payer: No Typology Code available for payment source

## 2020-02-15 VITALS — BP 128/58 | HR 73 | Temp 97.4°F | Resp 20 | Wt 164.0 lb

## 2020-02-15 DIAGNOSIS — C9000 Multiple myeloma not having achieved remission: Secondary | ICD-10-CM | POA: Insufficient documentation

## 2020-02-15 DIAGNOSIS — D649 Anemia, unspecified: Secondary | ICD-10-CM | POA: Diagnosis not present

## 2020-02-15 DIAGNOSIS — Z7982 Long term (current) use of aspirin: Secondary | ICD-10-CM | POA: Insufficient documentation

## 2020-02-15 DIAGNOSIS — I129 Hypertensive chronic kidney disease with stage 1 through stage 4 chronic kidney disease, or unspecified chronic kidney disease: Secondary | ICD-10-CM | POA: Insufficient documentation

## 2020-02-15 DIAGNOSIS — R831 Abnormal level of hormones in cerebrospinal fluid: Secondary | ICD-10-CM | POA: Insufficient documentation

## 2020-02-15 DIAGNOSIS — N183 Chronic kidney disease, stage 3 unspecified: Secondary | ICD-10-CM | POA: Diagnosis not present

## 2020-02-15 DIAGNOSIS — N189 Chronic kidney disease, unspecified: Secondary | ICD-10-CM | POA: Diagnosis not present

## 2020-02-15 DIAGNOSIS — R5383 Other fatigue: Secondary | ICD-10-CM | POA: Diagnosis not present

## 2020-02-15 DIAGNOSIS — Z79899 Other long term (current) drug therapy: Secondary | ICD-10-CM | POA: Diagnosis not present

## 2020-02-15 DIAGNOSIS — D6959 Other secondary thrombocytopenia: Secondary | ICD-10-CM | POA: Diagnosis not present

## 2020-02-15 DIAGNOSIS — E039 Hypothyroidism, unspecified: Secondary | ICD-10-CM | POA: Diagnosis not present

## 2020-02-15 LAB — CBC WITH DIFFERENTIAL/PLATELET
Abs Immature Granulocytes: 0.5 10*3/uL — ABNORMAL HIGH (ref 0.00–0.07)
Band Neutrophils: 2 %
Basophils Absolute: 0.4 10*3/uL — ABNORMAL HIGH (ref 0.0–0.1)
Basophils Relative: 4 %
Eosinophils Absolute: 0.4 10*3/uL (ref 0.0–0.5)
Eosinophils Relative: 5 %
HCT: 22.9 % — ABNORMAL LOW (ref 36.0–46.0)
Hemoglobin: 7 g/dL — ABNORMAL LOW (ref 12.0–15.0)
Lymphocytes Relative: 35 %
Lymphs Abs: 3.1 10*3/uL (ref 0.7–4.0)
MCH: 27 pg (ref 26.0–34.0)
MCHC: 30.6 g/dL (ref 30.0–36.0)
MCV: 88.4 fL (ref 80.0–100.0)
Metamyelocytes Relative: 3 %
Monocytes Absolute: 1.5 10*3/uL — ABNORMAL HIGH (ref 0.1–1.0)
Monocytes Relative: 17 %
Myelocytes: 3 %
Neutro Abs: 2.9 10*3/uL (ref 1.7–7.7)
Neutrophils Relative %: 31 %
Platelets: 71 10*3/uL — ABNORMAL LOW (ref 150–400)
RBC: 2.59 MIL/uL — ABNORMAL LOW (ref 3.87–5.11)
RDW: 17.6 % — ABNORMAL HIGH (ref 11.5–15.5)
Smear Review: DECREASED
WBC: 8.9 10*3/uL (ref 4.0–10.5)
nRBC: 8 % — ABNORMAL HIGH (ref 0.0–0.2)

## 2020-02-15 LAB — BASIC METABOLIC PANEL
Anion gap: 9 (ref 5–15)
BUN: 48 mg/dL — ABNORMAL HIGH (ref 8–23)
CO2: 18 mmol/L — ABNORMAL LOW (ref 22–32)
Calcium: 7.5 mg/dL — ABNORMAL LOW (ref 8.9–10.3)
Chloride: 108 mmol/L (ref 98–111)
Creatinine, Ser: 4.15 mg/dL — ABNORMAL HIGH (ref 0.44–1.00)
GFR calc Af Amer: 11 mL/min — ABNORMAL LOW (ref >60)
GFR calc non Af Amer: 9 mL/min — ABNORMAL LOW (ref >60)
Glucose, Bld: 148 mg/dL — ABNORMAL HIGH (ref 70–99)
Potassium: 5.2 mmol/L — ABNORMAL HIGH (ref 3.5–5.1)
Sodium: 135 mmol/L (ref 135–145)

## 2020-02-15 LAB — SAMPLE TO BLOOD BANK

## 2020-02-15 NOTE — Telephone Encounter (Signed)
Patient 's daughter called stating that she would like to request in home healthcare for mother. Please advise

## 2020-02-15 NOTE — Telephone Encounter (Signed)
Pt daughters dropped off document to be filled out by provider (FMLA- 2 pages in a yellow big envelope) [name] would like to be called when document ready to pick up at [phone]. Document put at front office tray under providers name.

## 2020-02-15 NOTE — Consult Note (Signed)
NEW PATIENT EVALUATION  Name: Carolyn Myers  MRN: [id number]  Date:   02/15/2020     DOB: October 21, 1936   This 84 y.o. female patient presents to the clinic for initial evaluation of lytic lesion of the right fibula and patient with known multiple myeloma.  REFERRING PHYSICIAN: Copland, Gay Filler, MD  CHIEF COMPLAINT:  Chief Complaint  Patient presents with  . Multiple Myeloma    DIAGNOSIS: The encounter diagnosis was Multiple myeloma not having achieved remission ([place]).   PREVIOUS INVESTIGATIONS:  Bone survey reviewed Pathology report reviewed Clinical notes reviewed  HPI: Patient is an 84 year old female who presents with chronic weakness and fatigue.  She was found to have on bone marrow biopsy multiple myeloma with 67 to 100% of her bone marrow replaced with plasma cell neoplasm.  Lambda light chains were incalculable.  Bone survey showed multiple lucencies within the skull also a destructive lesion in her right fibula causing right leg pain.  She is nonambulatory at this time.  Patient also has chronic renal failure with creatinine tending towards 4.8.  Patient's family is transferring her to [facility] she is seen today for consideration of palliation to her right tibia.  PLANNED TREATMENT REGIMEN: Single fraction palliation to right fibula  PAST MEDICAL HISTORY:  has a past medical history of Arthritis, Chicken pox, Diverticulitis, Frequent headaches, Gout, History of blood transfusion, History of gout, Hyperlipidemia, Hypertension, Hypothyroidism, Migraine, Palpitations, and Sleep apnea.    PAST SURGICAL HISTORY:  Past Surgical History:  Procedure Laterality Date  . ABDOMINAL HYSTERECTOMY     "for fibroid tumors"  . TUBAL LIGATION      FAMILY HISTORY: family history includes Arthritis in her mother; Cancer in her maternal grandmother; Emphysema in her maternal uncle; Healthy in her daughter and son; Heart attack in her maternal aunt and mother; Heart disease in her  mother and sister; Hypertension in her mother; Multiple myeloma in her maternal uncle; Renal Disease in her maternal aunt; Sickle cell trait in her grandchild; Stroke in her father; Thyroid disease in her sister.  SOCIAL HISTORY:  reports that she has never smoked. She has never used smokeless tobacco. She reports that she does not drink alcohol or use drugs.  ALLERGIES: Pineapple  MEDICATIONS:  Current Outpatient Medications  Medication Sig Dispense Refill  . aspirin EC 81 MG EC tablet Take 1 tablet (81 mg total) by mouth daily.    . Carboxymethylcellulose Sodium ([employer] LUBRICANT EYE DROPS PF OP) Place 1-2 drops into both eyes 3 (three) times daily as needed (for dry eyes).    Marland Kitchen diclofenac Sodium (VOLTAREN) 1 % GEL Apply 2 g topically 4 (four) times daily. Use as needed for painful joints.  Max 32 grams per day (Patient taking differently: Apply 2 g topically 4 (four) times daily as needed (to affected sites/painful joints (right leg/hip) ("MAX 32 GRAMS/DAY")). ) 100 g 3  . levothyroxine (SYNTHROID) 88 MCG tablet TAKE 1 TABLET BY MOUTH DAILY BEFORE BREAKFAST. (Patient taking differently: Take 88 mcg by mouth daily before breakfast. ) 90 tablet 1  . NIFEdipine (ADALAT CC) 30 MG 24 hr tablet Take 1 tablet (30 mg total) by mouth at bedtime. 30 tablet 0  . oxyCODONE (ROXICODONE) 5 MG immediate release tablet Take 1 tablet (5 mg total) by mouth every 4 (four) hours as needed for severe pain. 30 tablet 0  . rosuvastatin (CRESTOR) 10 MG tablet TAKE 1 TABLET BY MOUTH EVERY DAY 90 tablet 1  . hydrALAZINE (APRESOLINE) 100 MG tablet  TAKE 1 TABLET BY MOUTH 3 TIMES DAILY. MONITOR YOUR BLOOD PRESSURE, HOLD FOR BP 100/55 AND PULSE 55 (Patient not taking: Take 100 mg by mouth three times a day and MONITOR YOUR BLOOD PRESSURE- HOLD FOR B/P 100/55 AND PULSE 55) 270 tablet 1   No current facility-administered medications for this encounter.    ECOG PERFORMANCE STATUS:  2 - Symptomatic, <50% confined to  bed  REVIEW OF SYSTEMS: Multiple medical problems including arthritis hypertension hyperlipidemia hypothyroidism. Patient denies any weight loss, fatigue, weakness, fever, chills or night sweats. Patient denies any loss of vision, blurred vision. Patient denies any ringing  of the ears or hearing loss. No irregular heartbeat. Patient denies heart murmur or history of fainting. Patient denies any chest pain or pain radiating to her upper extremities. Patient denies any shortness of breath, difficulty breathing at night, cough or hemoptysis. Patient denies any swelling in the lower legs. Patient denies any nausea vomiting, vomiting of blood, or coffee ground material in the vomitus. Patient denies any stomach pain. Patient states has had normal bowel movements no significant constipation or diarrhea. Patient denies any dysuria, hematuria or significant nocturia. Patient denies any problems walking, swelling in the joints or loss of balance. Patient denies any skin changes, loss of hair or loss of weight. Patient denies any excessive worrying or anxiety or significant depression. Patient denies any problems with insomnia. Patient denies excessive thirst, polyuria, polydipsia. Patient denies any swollen glands, patient denies easy bruising or easy bleeding. Patient denies any recent infections, allergies or URI. Patient "s visual fields have not changed significantly in recent time.   PHYSICAL EXAM: BP (!) 128/58   Pulse 73   Temp (!) 97.4 F (36.3 C)   Resp 20   Wt 164 lb (74.4 kg) Comment: stated wt  BMI 29.05 kg/m  [place] female wheelchair-bound in NAD.  Well-developed well-nourished patient in NAD. HEENT reveals PERLA, EOMI, discs not visualized.  Oral cavity is clear. No oral mucosal lesions are identified. Neck is clear without evidence of cervical or supraclavicular adenopathy. Lungs are clear to A&P. Cardiac examination is essentially unremarkable with regular rate and rhythm without  murmur rub or thrill. Abdomen is benign with no organomegaly or masses noted. Motor sensory and DTR levels are equal and symmetric in the upper and lower extremities. Cranial nerves II through XII are grossly intact. Proprioception is intact. No peripheral adenopathy or edema is identified. No motor or sensory levels are noted. Crude visual fields are within normal range.  LABORATORY DATA: Pathology report reviewed    RADIOLOGY RESULTS: Bone survey reviewed compatible with above-stated findings   IMPRESSION: Myeloma involvement of right fibula an 84 year old female with multiple comorbidities and multiple myeloma  PLAN: At this time is to go ahead with single fraction palliative radiation therapy 800 centigrade x1 to her right fibula.  Risks and benefits of treatment including possible minor skin reaction fatigue were discussed with the patient and her daughter.  I have set her up for CT simulation tomorrow and will give her her first fraction next week.  Patient and daughter both comprehend her treatment plan well.  Studies that showed better local control with a 10-day course of treatment although hypofractionated single course of treatment is acceptable and is preferred in this woman with multiple comorbidities extensive multiple myeloma and renal failure.  I would like to take this opportunity to thank you for allowing me to participate in the care of your patient.Noreene Filbert, MD

## 2020-02-16 ENCOUNTER — Ambulatory Visit
Admission: RE | Admit: 2020-02-16 | Discharge: 2020-02-16 | Disposition: A | Discharge status: Home / Self Care | Payer: Medicare Other | Source: Ambulatory Visit | Attending: Radiation Oncology | Admitting: Radiation Oncology

## 2020-02-16 ENCOUNTER — Other Ambulatory Visit: Payer: Self-pay

## 2020-02-16 DIAGNOSIS — C9000 Multiple myeloma not having achieved remission: Secondary | ICD-10-CM | POA: Insufficient documentation

## 2020-02-16 NOTE — Telephone Encounter (Signed)
Patient 's daughter called stating that she would like to request in home healthcare for mother. Forms in providers folder up front. Will pick up Monday when I am back at [place] location.

## 2020-02-17 NOTE — Progress Notes (Signed)
[facility]  [phone]) [phone] [phone]) [phone]  ID: Carolyn Myers OB: 29-Jun-1936  MR#: [id number]  [id number]#:[id number]  Patient Care Team: Darreld Mclean, MD as PCP - General (Family Medicine) Belva Crome, MD as PCP - Cardiology (Cardiology)  CHIEF COMPLAINT: Multiple Myeloma.  INTERVAL HISTORY: Patient returns to clinic today for repeat laboratory work and further evaluation.  She also was seen in radiation oncology and had a one-time treatment for her right lower leg pathologic fracture.  She is lethargic.  She continues to have chronic weakness and fatigue.  Her right lower leg pain is only mildly improved. She has no neurologic complaints.  She denies any recent fevers.  She has a fair appetite, but denies weight loss.  She has no chest pain, shortness of breath, cough, or hemoptysis.  She denies any nausea, vomiting, constipation, or diarrhea.  She has no urinary complaints.  Patient offers no further specific complaints today.  REVIEW OF SYSTEMS:   Review of Systems  Constitutional: Positive for malaise/fatigue. Negative for fever and weight loss.  Respiratory: Negative.  Negative for cough, hemoptysis and shortness of breath.   Cardiovascular: Negative.  Negative for chest pain and leg swelling.  Gastrointestinal: Negative.  Negative for abdominal pain.  Genitourinary: Negative.  Negative for dysuria.  Musculoskeletal: Negative for back pain.       Right lower leg pain  Skin: Negative.  Negative for rash.  Neurological: Positive for weakness. Negative for dizziness, focal weakness and headaches.  Psychiatric/Behavioral: Negative.  The patient is not nervous/anxious and does not have insomnia.     As per HPI. Otherwise, a complete review of systems is negative.  PAST MEDICAL HISTORY: Past Medical History:  Diagnosis Date  . Arthritis    "minor in my shoulders" (02/21/2016)  . Chicken pox   . Diverticulitis   . Frequent headaches    "maybe  twice/week" (03/12/2016)  . Gout    Right Foot  . History of blood transfusion    Reported transfusion following hysterectomy  . History of gout   . Hyperlipidemia   . Hypertension   . Hypothyroidism   . Migraine    "maybe twice/year" (02/21/2016)  . Palpitations    "doctor thought it was related to my thyroid"  . Sleep apnea    "I don't wear my mask" (02/21/2016)    PAST SURGICAL HISTORY: Past Surgical History:  Procedure Laterality Date  . ABDOMINAL HYSTERECTOMY     "for fibroid tumors"  . TUBAL LIGATION      FAMILY HISTORY: Family History  Problem Relation Age of Onset  . Heart disease Mother   . Heart attack Mother        died from it  . Hypertension Mother   . Arthritis Mother   . Stroke Father   . Cancer Maternal Grandmother   . Heart attack Maternal Aunt   . Renal Disease Maternal Aunt   . Multiple myeloma Maternal Uncle   . Emphysema Maternal Uncle   . Heart disease Sister   . Thyroid disease Sister   . Healthy Son        x3  . Healthy Daughter        x5  . Sickle cell trait Grandchild     ADVANCED DIRECTIVES (Y/N):  N  HEALTH MAINTENANCE: Social History   Tobacco Use  . Smoking status: Never Smoker  . Smokeless tobacco: Never Used  Substance Use Topics  . Alcohol use: No  . Drug use: No  Colonoscopy:  PAP:  Bone density:  Lipid panel:  Allergies  Allergen Reactions  . Pineapple Swelling and Other (See Comments)    Reaction to fresh pineapple - tongue swells, but breathing is not affected    Current Outpatient Medications  Medication Sig Dispense Refill  . aspirin EC 81 MG EC tablet Take 1 tablet (81 mg total) by mouth daily.    . Carboxymethylcellulose Sodium ([employer] LUBRICANT EYE DROPS PF OP) Place 1-2 drops into both eyes 3 (three) times daily as needed (for dry eyes).    Marland Kitchen diclofenac Sodium (VOLTAREN) 1 % GEL Apply 2 g topically 4 (four) times daily. Use as needed for painful joints.  Max 32 grams per day (Patient taking differently:  Apply 2 g topically 4 (four) times daily as needed (to affected sites/painful joints (right leg/hip) ("MAX 32 GRAMS/DAY")). ) 100 g 3  . hydrALAZINE (APRESOLINE) 100 MG tablet TAKE 1 TABLET BY MOUTH 3 TIMES DAILY. MONITOR YOUR BLOOD PRESSURE, HOLD FOR BP 100/55 AND PULSE 55 270 tablet 1  . levothyroxine (SYNTHROID) 88 MCG tablet TAKE 1 TABLET BY MOUTH DAILY BEFORE BREAKFAST. (Patient taking differently: Take 88 mcg by mouth daily before breakfast. ) 90 tablet 1  . NIFEdipine (ADALAT CC) 30 MG 24 hr tablet Take 1 tablet (30 mg total) by mouth at bedtime. 30 tablet 0  . oxyCODONE (ROXICODONE) 5 MG immediate release tablet Take 1 tablet (5 mg total) by mouth every 4 (four) hours as needed for severe pain. 30 tablet 0  . rosuvastatin (CRESTOR) 10 MG tablet TAKE 1 TABLET BY MOUTH EVERY DAY 90 tablet 1  . sodium zirconium cyclosilicate (LOKELMA) 10 g PACK packet Take 10 g by mouth daily. 7 packet 0   No current facility-administered medications for this visit.    OBJECTIVE: Vitals:   02/20/20 1524  BP: (!) 121/50  Pulse: 67  Resp: 18  Temp: 98 F (36.7 C)  SpO2: 100%     Body mass index is 26.75 kg/m.    ECOG FS:2 - Symptomatic, <50% confined to bed  General: Well-developed, well-nourished, no acute distress.  Sitting in a wheelchair. Eyes: Pink conjunctiva, anicteric sclera. HEENT: Normocephalic, moist mucous membranes. Lungs: No audible wheezing or coughing. Heart: Regular rate and rhythm. Abdomen: Soft, nontender, no obvious distention. Musculoskeletal: No edema, cyanosis, or clubbing. Neuro: Alert, answering all questions appropriately. Cranial nerves grossly intact. Skin: No rashes or petechiae noted. Psych: Normal affect.  LAB RESULTS:  Lab Results  Component Value Date   NA 132 (L) 02/20/2020   K 6.4 (HH) 02/20/2020   CL 107 02/20/2020   CO2 13 (L) 02/20/2020   GLUCOSE 132 (H) 02/20/2020   BUN 58 (H) 02/20/2020   CREATININE 3.46 (H) 02/20/2020   CALCIUM 7.4 (L)  02/20/2020   PROT 7.4 01/17/2020   ALBUMIN 3.0 (L) 01/31/2020   AST 24 01/17/2020   ALT 6 01/17/2020   ALKPHOS 80 01/17/2020   BILITOT 0.6 01/17/2020   GFRNONAA 12 (L) 02/20/2020   GFRAA 14 (L) 02/20/2020    Lab Results  Component Value Date   WBC 9.3 02/20/2020   NEUTROABS 3.5 02/20/2020   HGB 7.1 (L) 02/20/2020   HCT 23.5 (L) 02/20/2020   MCV 89.4 02/20/2020   PLT 53 (L) 02/20/2020     STUDIES: US Renal  Result Date: 01/23/2020 CLINICAL DATA:  Right-sided flank pain for 2 weeks EXAM: RENAL / URINARY TRACT ULTRASOUND COMPLETE COMPARISON:  02/21/2016 FINDINGS: Right Kidney: Renal measurements: 10.6 x 5.0 by  5.1 cm = volume: 141 mL. Diffuse increased renal cortical echotexture and decreased corticomedullary differentiation, compatible with medical renal disease. Multifocal areas of cortical thinning and scarring. Multiple small simple renal cysts measuring up to 1.5 cm. Left Kidney: Renal measurements: 9.5 x 5.9 x 5.0 cm = volume: 148 mL. Diffuse increased renal cortical echotexture and decreased corticomedullary differentiation, compatible with medical renal disease. Bladder: Appears normal for degree of bladder distention. Other: No evidence of urinary tract calculi or obstructive uropathy. IMPRESSION: 1. Increased renal cortical echotexture consistent with medical renal disease. 2. Chronic right renal cortical thinning and scarring, not appreciably changed since prior CT exam. 3. Small simple cysts right kidney. Electronically Signed   By: Randa Ngo M.D.   On: 01/23/2020 15:49   DG Bone Survey Met  Result Date: 01/25/2020 CLINICAL DATA:  Low back pain.  Chronic kidney disease stage 3. EXAM: METASTATIC BONE SURVEY COMPARISON:  None. FINDINGS: Multiple small rounded lucencies are noted in the visualized skull which may represent venous lakes, but lytic lesions cannot be excluded. There is noted a lytic destructive lesion seen involving the proximal right fibular shaft concerning for  metastatic disease or malignancy. No other focal abnormality is noted. IMPRESSION: Multiple small rounded lucencies are noted in the visualized skull which may represent venous lakes, but lytic lesions related to multiple myeloma metastatic disease cannot be excluded. Also noted is lytic destructive lesion seen involving the proximal right fibular shaft consistent with metastatic disease or malignancy. Electronically Signed   By: Marijo Conception M.D.   On: 01/25/2020 11:27   CT BONE MARROW BIOPSY & ASPIRATION  Result Date: 01/30/2020 INDICATION: 84 year old female with a history multiple myeloma EXAM: CT BONE MARROW BIOPSY AND ASPIRATION MEDICATIONS: None. ANESTHESIA/SEDATION: Moderate (conscious) sedation was employed during this procedure. A total of Versed 0.5 mg and Fentanyl 50 mcg was administered intravenously. Moderate Sedation Time: 10 minutes. The patient's level of consciousness and vital signs were monitored continuously by radiology nursing throughout the procedure under my direct supervision. FLUOROSCOPY TIME:  CT COMPLICATIONS: None PROCEDURE: The procedure risks, benefits, and alternatives were explained to the patient. Questions regarding the procedure were encouraged and answered. The patient understands and consents to the procedure. Scout CT of the pelvis was performed for surgical planning purposes. The right posterior pelvis was prepped with Chlorhexidine in a sterile fashion, and a sterile drape was applied covering the operative field. A sterile gown and sterile gloves were used for the procedure. Local anesthesia was provided with 1% Lidocaine. Right posterior iliac bone was targeted for biopsy. The skin and subcutaneous tissues were infiltrated with 1% lidocaine without epinephrine. A small stab incision was made with an 11 blade scalpel, and an 11 gauge Murphy needle was advanced with CT guidance to the posterior cortex. Manual forced was used to advance the needle through the posterior  cortex and the stylet was removed. A bone marrow aspirate was retrieved and passed to a cytotechnologist in the room. The Murphy needle was then advanced without the stylet for a core biopsy. The core biopsy was retrieved and also passed to a cytotechnologist. Manual pressure was used for hemostasis and a sterile dressing was placed. No complications were encountered no significant blood loss was encountered. Patient tolerated the procedure well and remained hemodynamically stable throughout. IMPRESSION: Status post CT-guided bone marrow biopsy, with tissue specimen sent to pathology for complete histopathologic analysis Signed, Dulcy Fanny. Earleen Newport, DO [facility] Radiology Electronically Signed   By: York Cerise  Earleen Newport D.O.   On: 01/30/2020 10:31   US BIOPSY (KIDNEY)  Result Date: 01/26/2020 INDICATION: 84 year old female with acute on chronic kidney disease concerning for possible amyloidosis or multiple myeloma. EXAM: ULTRASOUND GUIDED RENAL BIOPSY COMPARISON:  None. MEDICATIONS: Fentanyl 50 mcg IV; Versed 1 mg IV ANESTHESIA/SEDATION: The patient's vital signs and level of consciousness were monitored continuously by radiology nursing under my direct supervision. Total Moderate Sedation time Thirteen minutes COMPLICATIONS: None immediate PROCEDURE: Informed written consent was obtained from the patient after a discussion of the risks, benefits and alternatives to treatment. The patient understands and consents the procedure. A timeout was performed prior to the initiation of the procedure. Ultrasound scanning was performed of the bilateral flanks. The inferior pole of the left kidney was selected for biopsy due to location and sonographic window. The procedure was planned. The operative site was prepped and draped in the usual sterile fashion. The overlying soft tissues were anesthetized with 1% lidocaine with epinephrine. A 17 gauge core needle biopsy device was  advanced into the inferior cortex of the left kidney and 3 core biopsies were obtained under direct ultrasound guidance. Images were saved for documentation purposes. The biopsy device was removed and hemostasis was obtained with manual compression. Post procedural scanning was negative for significant post procedural hemorrhage or additional complication. A dressing was placed. The patient tolerated the procedure well without immediate post procedural complication. IMPRESSION: Technically successful ultrasound guided left renal biopsy. Electronically Signed   By: Jacqulynn Cadet M.D.   On: 01/26/2020 15:27   DG HIP UNILAT W OR W/O PELVIS 2-3 VIEWS RIGHT  Result Date: 01/22/2020 CLINICAL DATA:  Right hip pain, no history of injury, pain for 3 weeks EXAM: DG HIP (WITH OR WITHOUT PELVIS) 2-3V RIGHT COMPARISON:  None. FINDINGS: Frontal view of the pelvis as well as frontal and frogleg lateral views of the right hip are obtained. No acute displaced fracture. Joint spaces are well preserved. There is prominent spondylosis within the lower lumbar spine greatest at L4-5 and L5-S1. Sacroiliac joints appear normal. IMPRESSION: 1. Unremarkable pelvis and right hip. 2. Lower lumbar spondylosis. Electronically Signed   By: Randa Ngo M.D.   On: 01/22/2020 16:37    ASSESSMENT: Multiple Myeloma  PLAN:    1. Multiple Myeloma: Bone marrow biopsy results reviewed independently with 67 to 100% of her bone marrow replaced with plasma cell neoplasm.  Her immunoglobulins are all within normal limits, but her lambda light chains are incalculable.  Metastatic bone survey results reviewed independently and reported as above with multiple lucencies consistent with multiple myeloma.  She also has a destructive lesion in her right fibula likely causing her pain.  She has evidence of endorgan damage with worsening renal failure, anemia, and thrombocytopenia.  She recently had hypercalcemia, but this is since resolved.  She is  now back home in [place] and has follow-up with primary care tomorrow and medical oncology on February 28, 2020.  No further follow-up is planned at [facility].   2.  Chronic renal failure: Likely secondary to underlying myeloma.  Patient creatinine continues to improve and is now 3.46.  3.  Anemia: Hemoglobin significantly decreased, but stable at 7.1. 4.  Thrombocytopenia: Platelet count continues to trend down is now 53.  Follow-up with medical oncology in [place] as above. 5.  Hypercalcemia: Resolved. 6.  Hyperkalemia: Patient's potassium levels have increased significantly and are now 6.4.  Case discussed with nephrology, and they do not believe she needs emergent dialysis.  Patient  was given a prescription for Lokelma 10 mg daily.  Have asked patient's primary care to follow-up with laboratory work on Friday, February 23, 2020 to see if additional treatment is necessary.  Patient expressed understanding and was in agreement with this plan. She also understands that She can call clinic at any time with any questions, concerns, or complaints.    Lloyd Huger, MD   02/20/2020 3:55 PM

## 2020-02-18 DIAGNOSIS — C9000 Multiple myeloma not having achieved remission: Secondary | ICD-10-CM | POA: Diagnosis not present

## 2020-02-19 ENCOUNTER — Encounter: Payer: Self-pay | Admitting: Oncology

## 2020-02-19 ENCOUNTER — Other Ambulatory Visit: Payer: Self-pay

## 2020-02-19 NOTE — Progress Notes (Signed)
Patient unable to answer the phone, daughter supplied information. She reports that her mother's pain is increasing, she also reports dry hard stools, and decreased appetite.

## 2020-02-20 ENCOUNTER — Ambulatory Visit
Admission: RE | Admit: 2020-02-20 | Discharge: 2020-02-20 | Disposition: A | Discharge status: Home / Self Care | Payer: Medicare Other | Source: Ambulatory Visit | Attending: Radiation Oncology | Admitting: Radiation Oncology

## 2020-02-20 ENCOUNTER — Other Ambulatory Visit: Payer: Self-pay

## 2020-02-20 ENCOUNTER — Inpatient Hospital Stay (HOSPITAL_BASED_OUTPATIENT_CLINIC_OR_DEPARTMENT_OTHER): Payer: Medicare Other | Admitting: Oncology

## 2020-02-20 ENCOUNTER — Inpatient Hospital Stay: Payer: Medicare Other | Attending: Oncology

## 2020-02-20 VITALS — BP 121/50 | HR 67 | Temp 98.0°F | Resp 18 | Wt 151.0 lb

## 2020-02-20 DIAGNOSIS — C9000 Multiple myeloma not having achieved remission: Secondary | ICD-10-CM | POA: Diagnosis not present

## 2020-02-20 DIAGNOSIS — E875 Hyperkalemia: Secondary | ICD-10-CM | POA: Diagnosis not present

## 2020-02-20 DIAGNOSIS — N183 Chronic kidney disease, stage 3 unspecified: Secondary | ICD-10-CM | POA: Insufficient documentation

## 2020-02-20 DIAGNOSIS — E039 Hypothyroidism, unspecified: Secondary | ICD-10-CM | POA: Insufficient documentation

## 2020-02-20 DIAGNOSIS — D649 Anemia, unspecified: Secondary | ICD-10-CM | POA: Diagnosis not present

## 2020-02-20 LAB — CBC WITH DIFFERENTIAL/PLATELET
Abs Immature Granulocytes: 0.9 10*3/uL — ABNORMAL HIGH (ref 0.00–0.07)
Basophils Absolute: 0.1 10*3/uL (ref 0.0–0.1)
Basophils Relative: 1 %
Eosinophils Absolute: 0 10*3/uL (ref 0.0–0.5)
Eosinophils Relative: 0 %
HCT: 23.5 % — ABNORMAL LOW (ref 36.0–46.0)
Hemoglobin: 7.1 g/dL — ABNORMAL LOW (ref 12.0–15.0)
Immature Granulocytes: 10 %
Lymphocytes Relative: 31 %
Lymphs Abs: 2.9 10*3/uL (ref 0.7–4.0)
MCH: 27 pg (ref 26.0–34.0)
MCHC: 30.2 g/dL (ref 30.0–36.0)
MCV: 89.4 fL (ref 80.0–100.0)
Monocytes Absolute: 1.8 10*3/uL — ABNORMAL HIGH (ref 0.1–1.0)
Monocytes Relative: 20 %
Neutro Abs: 3.5 10*3/uL (ref 1.7–7.7)
Neutrophils Relative %: 38 %
Platelets: 53 10*3/uL — ABNORMAL LOW (ref 150–400)
RBC: 2.63 MIL/uL — ABNORMAL LOW (ref 3.87–5.11)
RDW: 18.7 % — ABNORMAL HIGH (ref 11.5–15.5)
Smear Review: DECREASED
WBC: 9.3 10*3/uL (ref 4.0–10.5)
nRBC: 13.9 % — ABNORMAL HIGH (ref 0.0–0.2)

## 2020-02-20 LAB — BASIC METABOLIC PANEL
Anion gap: 12 (ref 5–15)
BUN: 58 mg/dL — ABNORMAL HIGH (ref 8–23)
CO2: 13 mmol/L — ABNORMAL LOW (ref 22–32)
Calcium: 7.4 mg/dL — ABNORMAL LOW (ref 8.9–10.3)
Chloride: 107 mmol/L (ref 98–111)
Creatinine, Ser: 3.46 mg/dL — ABNORMAL HIGH (ref 0.44–1.00)
GFR calc Af Amer: 14 mL/min — ABNORMAL LOW (ref >60)
GFR calc non Af Amer: 12 mL/min — ABNORMAL LOW (ref >60)
Glucose, Bld: 132 mg/dL — ABNORMAL HIGH (ref 70–99)
Potassium: 6.4 mmol/L (ref 3.5–5.1)
Sodium: 132 mmol/L — ABNORMAL LOW (ref 135–145)

## 2020-02-20 MED ORDER — LOKELMA 10 G PO PACK: 10.0000 g | PACK | Freq: Every day | ORAL | 0 refills | Status: AC

## 2020-02-20 NOTE — Patient Instructions (Addendum)
It was good to see you again today, I am sorry you are feeling so bad, but hope we can get you feeling better  I refilled your pain medication, this can be used as needed We will also start you on fluoxetine or Prozac, 1 pill daily.  This should help with your energy level and depression symptoms  For constipation, I would suggest 1-2 doses of MiraLAX daily, and also 1 capsule of docusate sodium stool softener.  Please let me know if this does not help  We will work on getting you set up for home health to assist with bathing, and physical therapy  Please let me know what I can do to help during your upcoming treatment  We need to check your kidney function and potassium on Friday as a lab visit only-I will be in touch with these results as soon as possible

## 2020-02-20 NOTE — Progress Notes (Signed)
[place] at [facility] [address], [place], [place] [address] [phone]  Date:  02/21/2020   Name:  Carolyn Myers   DOB:  31-Mar-1936   MRN:  [id number]  PCP:  Darreld Mclean, MD    Chief Complaint: Constipation (3 days)   History of Present Illness:  Carolyn Myers is a 84 y.o. very pleasant female patient who presents with the following:  Elderly patient who was recently diagnosed with multiple myeloma.  Also history of chronic kidney disease temporarily worsened, hypothyroidism, hyperlipidemia, hypertension She was recently discharged from hospital to SNF for rehab.  She was admitted from 2/2-2/11 Her multiple myeloma diagnosis was confirmed on BMB at the hospital She is now back home and her daughter is taking care of her Current treatment plan per Dr. Grayland Ormond is for chemotherapy, and possible radiation to painful bone lesion in her right fibula.  She was seen in his office yesterday-however, the plan is to transfer her care to [facility] which is closer to her home Dr Grayland Ormond sent me the following message  I believe you have an appointment with her tomorrow. Her creatinine is improving, but her potassium levels were 6.4. I have called in a prescription for [facility] '10mg'$  packets. I ordered a 7-day supply, but not sure she will need all of it. Do you think you could check labs on Friday to see where her potassium levels are and if she needs additional treatment? She follows up with Dr. Lorenso Courier at the [facility] center on February 28, 2020 for evaluation and treatment of her underlying multiple myeloma. Thanks! Please give a call if you have a questions.   She had hypercalcemia, but this is now resolved Her most recent creatinine was 4.15-nephrologist is Dr. Posey Pronto with [facility] She was transfused 2 units of PRBC due to anemia on February 3 Her main concern today is constipation.  She is using  oxycodone as needed for pain, her daughter admits she is not quite sure of her pain control schedule because other family members are helping with her mom's care.  She does need a refill of oxycodone now So far they are not using any stool softeners or laxatives.  They think her last BM was about 3 days ago Her daughter notes her anus seems to be itchy, the patient wants to scratch it  Patient today states that she "just wants to die" due to her current cancer diagnosis and pain.  I asked her if she was in any danger of self-harm, she denies any suicidal ideation or intent.  She expresses a passive wish to die.  She is interested in starting medication for depression, I will start her on fluoxetine now I shared with the patient my hope and expectation that her symptoms will improve, and that her quality of life will get much better.  Her daughter needs FMLA paperwork completed so she can care for her mother.  We will have her out of work completely for the next 2 months during the most intensive.  Aftercare, then will reassess  They also need a home health referral for nursing and PT  Recent TSH also noted to be extremely high-her daughter confirms she is now taking her thyroid replacement  Aspirin 81 Voltaren gel Hydralazine Levothyroxine Nifedipine 30 Oxycodone prescribed in the hospital Crestor  BP Readings from Last 3 Encounters:  02/21/20 (!) 110/54  02/20/20 (!) 121/50  02/15/20 Marland Kitchen)  128/58   Blood pressure is borderline low, about at baseline Patient is taking nifedipine 30, she also has hydralazine which is to be held for hypotension or bradycardia  12/28/2019  1   12/28/2019  Acetaminophen-Cod #3 Tablet  20.00  7 Je Cop   [id number]   Nor (4625)   0  12.86 MME    Patient's daughter reports they are also using oxycodone which was prescribed by the hospital  Patient Active Problem List   Diagnosis Date Noted  . Multiple myeloma not having achieved remission ([place])   . AKI (acute  kidney injury) ([place]) 01/23/2020  . Anemia   . Osteoporosis 09/05/2019  . Frail elderly 08/03/2019  . 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 Hypertensive urgency 08/23/2018  . Atypical chest pain 08/22/2018  . Sleep apnea   . Palpitations   . Migraine   . Hyperlipidemia   . History of gout   . History of blood transfusion   . Frequent headaches   . Diverticulitis   . Chicken pox   . Arthritis   . CKD (chronic kidney disease) stage 3, GFR 30-59 ml/min (HCC) 02/23/2017  . Age-related osteoporosis without current pathological fracture 02/23/2017  . Esophageal reflux 05/08/2016  . Renal artery stenosis ([place]) 02/22/2016  . Hypothyroidism 02/22/2016  . Chest pain with high risk for cardiac etiology 02/21/2016  . Gout   . SOB (shortness of breath) on exertion 10/06/2015  . Accelerated hypertension 10/06/2015  . Essential hypertension, benign 11/26/2013  . Hyperlipidemia LDL goal <100 11/26/2013    Past Medical History:  Diagnosis Date  . Arthritis    "minor in my shoulders" (02/21/2016)  . Chicken pox   . Diverticulitis   . Frequent headaches    "maybe twice/week" (03/12/2016)  . Gout    Right Foot  . History of blood transfusion    Reported transfusion following hysterectomy  . History of gout   . Hyperlipidemia   . Hypertension   . Hypothyroidism   . Migraine    "maybe twice/year" (02/21/2016)  . Palpitations    "doctor thought it was related to my thyroid"  . Sleep apnea    "I don't wear my mask" (02/21/2016)    Past Surgical History:  Procedure Laterality Date  . ABDOMINAL HYSTERECTOMY     "for fibroid tumors"  . TUBAL LIGATION      Social History   Tobacco Use  . Smoking status: Never Smoker  . Smokeless tobacco: Never Used  Substance Use Topics  . Alcohol use: No  . Drug use: No    Family History  Problem Relation Age of Onset  . Heart disease Mother   . Heart attack Mother        died from it  . Hypertension Mother   . Arthritis Mother   . Stroke  Father   . Cancer Maternal Grandmother   . Heart attack Maternal Aunt   . Renal Disease Maternal Aunt   . Multiple myeloma Maternal Uncle   . Emphysema Maternal Uncle   . Heart disease Sister   . Thyroid disease Sister   . Healthy Son        x3  . Healthy Daughter        x5  . Sickle cell trait Grandchild     Allergies  Allergen Reactions  . Pineapple Swelling and Other (See Comments)    Reaction to fresh pineapple - tongue swells, but breathing is not affected    Medication list has  been reviewed and updated.  Current Outpatient Medications on File Prior to Visit  Medication Sig Dispense Refill  . aspirin EC 81 MG EC tablet Take 1 tablet (81 mg total) by mouth daily.    . Carboxymethylcellulose Sodium ([employer] LUBRICANT EYE DROPS PF OP) Place 1-2 drops into both eyes 3 (three) times daily as needed (for dry eyes).    Marland Kitchen diclofenac Sodium (VOLTAREN) 1 % GEL Apply 2 g topically 4 (four) times daily. Use as needed for painful joints.  Max 32 grams per day (Patient taking differently: Apply 2 g topically 4 (four) times daily as needed (to affected sites/painful joints (right leg/hip) ("MAX 32 GRAMS/DAY")). ) 100 g 3  . hydrALAZINE (APRESOLINE) 100 MG tablet TAKE 1 TABLET BY MOUTH 3 TIMES DAILY. MONITOR YOUR BLOOD PRESSURE, HOLD FOR BP 100/55 AND PULSE 55 270 tablet 1  . levothyroxine (SYNTHROID) 88 MCG tablet TAKE 1 TABLET BY MOUTH DAILY BEFORE BREAKFAST. (Patient taking differently: Take 88 mcg by mouth daily before breakfast. ) 90 tablet 1  . NIFEdipine (ADALAT CC) 30 MG 24 hr tablet Take 1 tablet (30 mg total) by mouth at bedtime. 30 tablet 0  . oxyCODONE (ROXICODONE) 5 MG immediate release tablet Take 1 tablet (5 mg total) by mouth every 4 (four) hours as needed for severe pain. 30 tablet 0  . rosuvastatin (CRESTOR) 10 MG tablet TAKE 1 TABLET BY MOUTH EVERY DAY 90 tablet 1  . sodium zirconium cyclosilicate (LOKELMA) 10 g PACK packet Take 10 g by mouth daily. 7 packet 0   No current  facility-administered medications on file prior to visit.    Review of Systems:  As per HPI- otherwise negative.   Physical Examination: Vitals:   02/21/20 0910  BP: (!) 110/54  Pulse: 86  Resp: 20  Temp: (!) 95.6 F (35.3 C)  SpO2: 99%   Vitals:   02/21/20 0910  Weight: 154 lb (69.9 kg)  Height: '5\' 3"'$  (1.6 m)   Body mass index is 27.28 kg/m. Ideal Body Weight: Weight in (lb) to have BMI = 25: 140.8  GEN: no acute distress.  Patient appears at her recent baseline.  Flat affect, she will answer questions when prompted.  She became more animated and vehemently denied any desire for self-harm when I asked her about suicidal ideation HEENT: Atraumatic, Normocephalic.  Ears and Nose: No external deformity. CV: RRR, No M/G/R. No JVD. No thrill. No extra heart sounds. PULM: CTA B, no wheezes, crackles, rhonchi. No retractions. No resp. distress. No accessory muscle use. ABD: S, NT, ND, +BS. No rebound. No HSM. Patient was able to stand for examination of her rectal area.  She does appear to have stool in the vault, I advised her daughter that they should try having her sit on the toilet ASAP EXTR: No c/c/e PSYCH: Normally interactive. Conversant.     Assessment and Plan: Multiple myeloma, remission status unspecified ([place]) - Plan: oxyCODONE (ROXICODONE) 5 MG immediate release tablet, DISCONTINUED: oxyCODONE (ROXICODONE) 5 MG immediate release tablet  Stage 3 chronic kidney disease, unspecified whether stage 3a or 3b CKD - Plan: Basic metabolic panel  Acquired hypothyroidism  Frail elderly  Hyperkalemia - Plan: Basic metabolic panel  Adjustment disorder with depressed mood - Plan: FLUoxetine (PROZAC) 20 MG tablet  Following up today from recent diagnosis of multiple myeloma.  Patient is having significant pain, she is undergoing radiation and will have a consultation with [facility] next week. She is currently taking medication to lower her  potassium level.  We  have scheduled a lab visit for this coming Friday to check on her progress We will have her start on fluoxetine 20 mg for depression symptoms Completed FMLA paperwork for her daughter, will refer to home health Visit complexity level is high, greater than 40 minutes spent in face-to-face care and coordination of care  With labs, will touch base with daughter regarding blood pressure treatment.     This visit occurred during the SARS-CoV-2 public health emergency.  Safety protocols were in place, including screening questions prior to the visit, additional usage of staff PPE, and extensive cleaning of exam room while observing appropriate contact time as indicated for disinfecting solutions.    Signed Lamar Blinks, MD

## 2020-02-21 ENCOUNTER — Encounter: Payer: Self-pay | Admitting: Family Medicine

## 2020-02-21 ENCOUNTER — Other Ambulatory Visit: Payer: Self-pay

## 2020-02-21 ENCOUNTER — Ambulatory Visit (INDEPENDENT_AMBULATORY_CARE_PROVIDER_SITE_OTHER): Payer: Medicare Other | Admitting: Family Medicine

## 2020-02-21 VITALS — BP 110/54 | HR 86 | Temp 95.6°F | Resp 20 | Ht 63.0 in | Wt 154.0 lb

## 2020-02-21 DIAGNOSIS — R54 Age-related physical debility: Secondary | ICD-10-CM

## 2020-02-21 DIAGNOSIS — E875 Hyperkalemia: Secondary | ICD-10-CM | POA: Diagnosis not present

## 2020-02-21 DIAGNOSIS — N183 Chronic kidney disease, stage 3 unspecified: Secondary | ICD-10-CM

## 2020-02-21 DIAGNOSIS — C9000 Multiple myeloma not having achieved remission: Secondary | ICD-10-CM | POA: Diagnosis not present

## 2020-02-21 DIAGNOSIS — E039 Hypothyroidism, unspecified: Secondary | ICD-10-CM | POA: Diagnosis not present

## 2020-02-21 DIAGNOSIS — F4321 Adjustment disorder with depressed mood: Secondary | ICD-10-CM

## 2020-02-21 LAB — SAMPLE TO BLOOD BANK

## 2020-02-21 MED ORDER — FLUOXETINE HCL 20 MG PO TABS: 20.0000 mg | ORAL_TABLET | Freq: Every day | ORAL | 3 refills | Status: DC

## 2020-02-21 MED ORDER — OXYCODONE HCL 5 MG PO TABS: 2.5000 mg | ORAL_TABLET | Freq: Four times a day (QID) | ORAL | 0 refills | Status: DC | PRN

## 2020-02-22 DIAGNOSIS — I129 Hypertensive chronic kidney disease with stage 1 through stage 4 chronic kidney disease, or unspecified chronic kidney disease: Secondary | ICD-10-CM | POA: Diagnosis not present

## 2020-02-22 DIAGNOSIS — E039 Hypothyroidism, unspecified: Secondary | ICD-10-CM | POA: Diagnosis not present

## 2020-02-22 DIAGNOSIS — G8929 Other chronic pain: Secondary | ICD-10-CM | POA: Diagnosis not present

## 2020-02-22 DIAGNOSIS — D631 Anemia in chronic kidney disease: Secondary | ICD-10-CM | POA: Diagnosis not present

## 2020-02-22 DIAGNOSIS — Z87440 Personal history of urinary (tract) infections: Secondary | ICD-10-CM | POA: Diagnosis not present

## 2020-02-22 DIAGNOSIS — E785 Hyperlipidemia, unspecified: Secondary | ICD-10-CM | POA: Diagnosis not present

## 2020-02-22 DIAGNOSIS — M25551 Pain in right hip: Secondary | ICD-10-CM | POA: Diagnosis not present

## 2020-02-22 DIAGNOSIS — C9000 Multiple myeloma not having achieved remission: Secondary | ICD-10-CM | POA: Diagnosis not present

## 2020-02-22 DIAGNOSIS — M109 Gout, unspecified: Secondary | ICD-10-CM | POA: Diagnosis not present

## 2020-02-22 DIAGNOSIS — Z9181 History of falling: Secondary | ICD-10-CM | POA: Diagnosis not present

## 2020-02-22 DIAGNOSIS — Z79891 Long term (current) use of opiate analgesic: Secondary | ICD-10-CM | POA: Diagnosis not present

## 2020-02-22 DIAGNOSIS — N1832 Chronic kidney disease, stage 3b: Secondary | ICD-10-CM | POA: Diagnosis not present

## 2020-02-22 DIAGNOSIS — Z7401 Bed confinement status: Secondary | ICD-10-CM | POA: Diagnosis not present

## 2020-02-23 ENCOUNTER — Ambulatory Visit: Payer: Medicare Other | Admitting: Hematology and Oncology

## 2020-02-23 ENCOUNTER — Other Ambulatory Visit: Payer: Medicare Other

## 2020-02-23 ENCOUNTER — Telehealth: Payer: Self-pay | Admitting: Family Medicine

## 2020-02-23 DIAGNOSIS — C9000 Multiple myeloma not having achieved remission: Secondary | ICD-10-CM | POA: Diagnosis not present

## 2020-02-23 DIAGNOSIS — Z87440 Personal history of urinary (tract) infections: Secondary | ICD-10-CM | POA: Diagnosis not present

## 2020-02-23 DIAGNOSIS — Z7401 Bed confinement status: Secondary | ICD-10-CM | POA: Diagnosis not present

## 2020-02-23 DIAGNOSIS — M109 Gout, unspecified: Secondary | ICD-10-CM | POA: Diagnosis not present

## 2020-02-23 DIAGNOSIS — N1832 Chronic kidney disease, stage 3b: Secondary | ICD-10-CM | POA: Diagnosis not present

## 2020-02-23 DIAGNOSIS — D631 Anemia in chronic kidney disease: Secondary | ICD-10-CM | POA: Diagnosis not present

## 2020-02-23 DIAGNOSIS — I129 Hypertensive chronic kidney disease with stage 1 through stage 4 chronic kidney disease, or unspecified chronic kidney disease: Secondary | ICD-10-CM | POA: Diagnosis not present

## 2020-02-23 DIAGNOSIS — G8929 Other chronic pain: Secondary | ICD-10-CM | POA: Diagnosis not present

## 2020-02-23 DIAGNOSIS — M25551 Pain in right hip: Secondary | ICD-10-CM | POA: Diagnosis not present

## 2020-02-23 DIAGNOSIS — E039 Hypothyroidism, unspecified: Secondary | ICD-10-CM | POA: Diagnosis not present

## 2020-02-23 DIAGNOSIS — Z9181 History of falling: Secondary | ICD-10-CM | POA: Diagnosis not present

## 2020-02-23 DIAGNOSIS — Z79891 Long term (current) use of opiate analgesic: Secondary | ICD-10-CM | POA: Diagnosis not present

## 2020-02-23 DIAGNOSIS — E785 Hyperlipidemia, unspecified: Secondary | ICD-10-CM | POA: Diagnosis not present

## 2020-02-23 NOTE — Telephone Encounter (Signed)
Carolyn Myers with Home pt Nurse called stating the Patient &  family wants to request for a Hospice Referral. Stated that patient has given up and refuses to due therapy.Marland Kitchen

## 2020-02-26 ENCOUNTER — Telehealth: Payer: Self-pay

## 2020-02-26 DIAGNOSIS — N1832 Chronic kidney disease, stage 3b: Secondary | ICD-10-CM | POA: Diagnosis not present

## 2020-02-26 DIAGNOSIS — D631 Anemia in chronic kidney disease: Secondary | ICD-10-CM | POA: Diagnosis not present

## 2020-02-26 DIAGNOSIS — Z79891 Long term (current) use of opiate analgesic: Secondary | ICD-10-CM | POA: Diagnosis not present

## 2020-02-26 DIAGNOSIS — Z87440 Personal history of urinary (tract) infections: Secondary | ICD-10-CM | POA: Diagnosis not present

## 2020-02-26 DIAGNOSIS — G8929 Other chronic pain: Secondary | ICD-10-CM | POA: Diagnosis not present

## 2020-02-26 DIAGNOSIS — C9000 Multiple myeloma not having achieved remission: Secondary | ICD-10-CM | POA: Diagnosis not present

## 2020-02-26 DIAGNOSIS — M109 Gout, unspecified: Secondary | ICD-10-CM | POA: Diagnosis not present

## 2020-02-26 DIAGNOSIS — M25551 Pain in right hip: Secondary | ICD-10-CM | POA: Diagnosis not present

## 2020-02-26 DIAGNOSIS — Z7401 Bed confinement status: Secondary | ICD-10-CM | POA: Diagnosis not present

## 2020-02-26 DIAGNOSIS — Z9181 History of falling: Secondary | ICD-10-CM | POA: Diagnosis not present

## 2020-02-26 DIAGNOSIS — E039 Hypothyroidism, unspecified: Secondary | ICD-10-CM | POA: Diagnosis not present

## 2020-02-26 DIAGNOSIS — I129 Hypertensive chronic kidney disease with stage 1 through stage 4 chronic kidney disease, or unspecified chronic kidney disease: Secondary | ICD-10-CM | POA: Diagnosis not present

## 2020-02-26 DIAGNOSIS — E785 Hyperlipidemia, unspecified: Secondary | ICD-10-CM | POA: Diagnosis not present

## 2020-02-26 NOTE — Telephone Encounter (Signed)
Called her daughter to discuss- she states that her mom is refusing treatment, she says that she has had a good life and is ready to go.  She would like a hospice consultation.  She does have an oncology appointment with a new provider in 2 days.  I will go ahead and place hospice referral, but encouraged daughter to see if her mother will at least attend upcoming oncology appointment.  I want to be sure she has the best information prior to making the decision to go on hospice, as I do think she may feel significantly better and have more quality time with treatment.  Her daughter agrees with this plan

## 2020-02-26 NOTE — Telephone Encounter (Signed)
Verbal order given to Amy at [phone] for hospice care

## 2020-02-26 NOTE — Telephone Encounter (Signed)
Patient Hospice Nurse Dorian Pod called in to get an order for Hospice Care please fax the order to [phone] Thanks,   Please follow up with Dorian Pod at [phone]   Thanks,

## 2020-02-27 ENCOUNTER — Telehealth: Payer: Self-pay | Admitting: Family Medicine

## 2020-02-27 NOTE — Telephone Encounter (Signed)
Called Abigail Butts back, she states her blood pressure was 90/38 after she called [place] she made a call to the patients daughter and realized the patient had been admitted with [facility] hospice services. She was advised a nurse will be going out to see her immediately.

## 2020-02-27 NOTE — Telephone Encounter (Signed)
Called her daughter Vonda Antigua She is not taking hydralazine Her mom is drinking water  The hospice nurse has not been out to check on her yet. They are expecting her to come out today I advised her to go to ER to get IVF if her BP does not come up.  We are hoping she will be willing to go to oncology appt tomorrow to get some more info prior to committing to only hospice care [place]

## 2020-02-27 NOTE — Telephone Encounter (Signed)
Patient is extreme hypotensive per Abigail Butts with [facility].  Please advise   All other vitals sign are normal.

## 2020-02-28 ENCOUNTER — Inpatient Hospital Stay: Payer: Medicare Other | Attending: Hematology and Oncology | Admitting: Hematology and Oncology

## 2020-02-28 ENCOUNTER — Other Ambulatory Visit: Payer: Self-pay

## 2020-02-28 ENCOUNTER — Telehealth: Payer: Self-pay | Admitting: Family Medicine

## 2020-02-28 ENCOUNTER — Inpatient Hospital Stay: Payer: Medicare Other

## 2020-02-28 VITALS — BP 115/50 | HR 70 | Temp 98.5°F | Resp 17 | Ht 63.0 in | Wt 152.0 lb

## 2020-02-28 DIAGNOSIS — C9000 Multiple myeloma not having achieved remission: Secondary | ICD-10-CM | POA: Diagnosis not present

## 2020-02-28 DIAGNOSIS — Z7189 Other specified counseling: Secondary | ICD-10-CM

## 2020-02-28 DIAGNOSIS — E039 Hypothyroidism, unspecified: Secondary | ICD-10-CM | POA: Insufficient documentation

## 2020-02-28 NOTE — Telephone Encounter (Signed)
Caller Name: Olivia Mackie w/[facility] Phone: [phone]  Olivia Mackie notes that pt daughter, [name], did want to have Hospice/Palliative services thru [facility] after researching the companies. She confirms that [facility] showed up at the pts home stating Dr. Lorelei Pont ordered their services. This was prior to orders being sent. The referral was sent to [facility] by myself on 02/27/2020.   Per Linus Orn this situation has happened one other time in the past 5 days at another [facility] office. Her concern is for the safety of our patients and the integrity of the persons going into patient's homes.   Upon review tel notes the call 3/8 noted Dorian Pod as a Merchandiser, retail. She is the Financial trader for [employer]. I am unable to find the source of how they obtained the patient's information to setup service.

## 2020-02-28 NOTE — Progress Notes (Signed)
Pt was seen seen by [facility] on 02/27/20  She was prescribed Morphine sulfate liquid 20mg /ml   She has had only 2 doses for rib pain last night. None today. She was also prescribed Compazine 10 mg po every 6 hours as needed for nausea. She had 1 tablet this morning prior to coming to this office for her appt.  Daughter, [name], states that her blood pressure medications have been stopped. Med list updated.  [name] also states pt is taking only sips and nibbles of food, sleeps most of the time and cannot do ADLs unassisted any more. Pt awake but lethargic during appt today. Daughter states they will prefer [facility] hospice if that is the final decision regarding pt's care going forward. Dr. Lorenso Courier aware of the above.

## 2020-02-28 NOTE — Telephone Encounter (Signed)
Carolyn Myers with [facility] called states she wanted to speak with you about a referral. She said she had further question  [phone]

## 2020-03-02 ENCOUNTER — Encounter: Payer: Self-pay | Admitting: Hematology and Oncology

## 2020-03-02 DIAGNOSIS — Z7189 Other specified counseling: Secondary | ICD-10-CM | POA: Insufficient documentation

## 2020-03-02 NOTE — Progress Notes (Signed)
[facility] [phone]   [phone]  INITIAL CONSULT NOTE  Patient Care Team: Copland, Gay Filler, MD as PCP - General (Family Medicine) Belva Crome, MD as PCP - Cardiology (Cardiology)  Hematological/Oncological History #Multiple Myeloma  1) 01/30/2020: Bone Marrow Biopsy performed, showed plasma cell constituting 67% of all cells in the marrow.  2) 02/12/2020: patient establish cared with Dr. Delight Hoh at [facility]. M spike 0.7, kappa 38.6, lambda too high to calculate (previously 12,252 on 01/23/2020)  3) 01/25/2020: bone survey shows numerous destructive lesions consistent with MM. 4) 02/28/2020: second opinion with Dr. Lorenso Courier   CHIEF COMPLAINTS/PURPOSE OF CONSULTATION:  "Multiple Myeloma "  HISTORY OF PRESENTING ILLNESS:  Carolyn Myers 84 y.o. female with medical history significant for newly diagnosed multiple myeloma who presents for a second opinion regarding care.   On review of the previous records Ms. Verga began a multiple myeloma work-up in early February 2021.  She had a bone survey which showed numerous destructive lesions consistent with multiple myeloma.  Additionally she had an elevated M spike of 0.5 with a markedly elevated kappa lambda ratio.  Her lambda light chain was noted to be 12,252 on 01/23/2020.  As part of her evaluation she underwent a bone marrow biopsy on 01/30/2020 which showed plasma cells constituting 60% of all cells in the bone marrow.  The patient was seen by Dr. Delight Hoh on 02/12/2020.  The patient had been desiring palliative care only.  The family requested a second opinion at [facility] [facility] prior to determining if comfort care measures alone were appropriate.  On exam today Ms. Obst does not miss words.  She notes that "I am ready to die" she notes that she does not want to go through any additional treatment, procedures, or interventions at this time.  Her son and  daughter accompany her on her visit today.  They report that she has had very poor p.o. intake and spends most of the day in bed sleeping.  They note that she has been very consistent and that she does not wish to have any further treatment.  They reported that they wish to hear this from an additional provider before deciding to go with hospice care alone.  The patient does note that she is having pain in her rib cage, which is currently being managed with liquid morphine by her hospice care.  Throughout most of the interview she has her head in her hands and notes that she simply wants to go home.  She looks uncomfortable and not entirely attentive at our meeting today.  After discussion with the family about the nature of multiple myeloma, the patient severe deconditioning, and the possible treatments moving forward they were in agreement that comfort care only would be appropriate at this time.  A focused ROS is listed below.  This was truncated as the patient was uncomfortable and very clear with her desires.  MEDICAL HISTORY:  Past Medical History:  Diagnosis Date  . Arthritis    "minor in my shoulders" (02/21/2016)  . Chicken pox   . Diverticulitis   . Frequent headaches    "maybe twice/week" (03/12/2016)  . Gout    Right Foot  . History of blood transfusion    Reported transfusion following hysterectomy  . History of gout   . Hyperlipidemia   . Hypertension   . Hypothyroidism   . Migraine    "maybe twice/year" (02/21/2016)  . Palpitations    "  doctor thought it was related to my thyroid"  . Sleep apnea    "I don't wear my mask" (02/21/2016)    SURGICAL HISTORY: Past Surgical History:  Procedure Laterality Date  . ABDOMINAL HYSTERECTOMY     "for fibroid tumors"  . TUBAL LIGATION      SOCIAL HISTORY: Social History   Socioeconomic History  . Marital status: Widowed    Spouse name: Not on file  . Number of children: 8  . Years of education: Not on file  . Highest education  level: Not on file  Occupational History  . Occupation: Retired  Tobacco Use  . Smoking status: Never Smoker  . Smokeless tobacco: Never Used  Substance and Sexual Activity  . Alcohol use: No  . Drug use: No  . Sexual activity: Never  Other Topics Concern  . Not on file  Social History Narrative   Lives alone, family helps in her care.   Social Determinants of Health   Financial Resource Strain:   . Difficulty of Paying Living Expenses:   Food Insecurity:   . Worried About Charity fundraiser in the Last Year:   . Arboriculturist in the Last Year:   Transportation Needs:   . Film/video editor (Medical):   Marland Kitchen Lack of Transportation (Non-Medical):   Physical Activity:   . Days of Exercise per Week:   . Minutes of Exercise per Session:   Stress:   . Feeling of Stress :   Social Connections:   . Frequency of Communication with Friends and Family:   . Frequency of Social Gatherings with Friends and Family:   . Attends Religious Services:   . Active Member of Clubs or Organizations:   . Attends Archivist Meetings:   Marland Kitchen Marital Status:   Intimate Partner Violence:   . Fear of Current or Ex-Partner:   . Emotionally Abused:   Marland Kitchen Physically Abused:   . Sexually Abused:     FAMILY HISTORY: Family History  Problem Relation Age of Onset  . Heart disease Mother   . Heart attack Mother        died from it  . Hypertension Mother   . Arthritis Mother   . Stroke Father   . Cancer Maternal Grandmother   . Heart attack Maternal Aunt   . Renal Disease Maternal Aunt   . Multiple myeloma Maternal Uncle   . Emphysema Maternal Uncle   . Heart disease Sister   . Thyroid disease Sister   . Healthy Son        x3  . Healthy Daughter        x5  . Sickle cell trait Grandchild     ALLERGIES:  is allergic to pineapple.  MEDICATIONS:  Current Outpatient Medications  Medication Sig Dispense Refill  . diclofenac Sodium (VOLTAREN) 1 % GEL Apply 2 g topically 4 (four)  times daily. Use as needed for painful joints.  Max 32 grams per day (Patient taking differently: Apply 2 g topically 4 (four) times daily as needed (to affected sites/painful joints (right leg/hip) ("MAX 32 GRAMS/DAY")). ) 100 g 3  . FLUoxetine (PROZAC) 20 MG tablet Take 1 tablet (20 mg total) by mouth daily. 30 tablet 3  . morphine 20 MG/5ML solution Take 5 mg by mouth every 2 (two) hours as needed for pain.    . polyethylene glycol (MIRALAX / GLYCOLAX) 17 g packet Take 17 g by mouth daily.    . prochlorperazine (COMPAZINE)  10 MG tablet Take 10 mg by mouth every 6 (six) hours as needed for nausea or vomiting.    . Carboxymethylcellulose Sodium ([employer] LUBRICANT EYE DROPS PF OP) Place 1-2 drops into both eyes 3 (three) times daily as needed (for dry eyes).    Marland Kitchen levothyroxine (SYNTHROID) 88 MCG tablet TAKE 1 TABLET BY MOUTH DAILY BEFORE BREAKFAST. (Patient taking differently: Take 88 mcg by mouth daily before breakfast. ) 90 tablet 1  . rosuvastatin (CRESTOR) 10 MG tablet TAKE 1 TABLET BY MOUTH EVERY DAY (Patient not taking: Reported on 02/28/2020) 90 tablet 1  . sodium zirconium cyclosilicate (LOKELMA) 10 g PACK packet Take 10 g by mouth daily. 7 packet 0   No current facility-administered medications for this visit.    REVIEW OF SYSTEMS:   Constitutional: ( - ) fevers, ( - )  chills , ( - ) night sweats Respiratory: ( - ) cough, ( - ) dyspnea, ( - ) wheezes Gastrointestinal:  ( - ) nausea, ( - ) heartburn, ( - ) change in bowel habits All other systems were reviewed with the patient and are negative.  PHYSICAL EXAMINATION: ECOG PERFORMANCE STATUS: 4 - Bedbound  Vitals:   02/28/20 1254  BP: (!) 115/50  Pulse: 70  Resp: 17  Temp: 98.5 F (36.9 C)  SpO2: 99%   Filed Weights   02/28/20 1254  Weight: 152 lb (68.9 kg)    GENERAL: chronically ill appearing elderly African American female, appears uncomfortable and tired.  SKIN: skin color, texture, turgor are normal, no rashes or  significant lesions EYES: conjunctiva are pink and non-injected, sclera clear LUNGS: clear to auscultation and percussion with normal breathing effort HEART: regular rate & rhythm and no murmurs and no lower extremity edema Musculoskeletal: no cyanosis of digits and no clubbing  PSYCH: alert & oriented x 3, fluent speech NEURO: no focal motor/sensory deficits  LABORATORY DATA:  I have reviewed the data as listed CBC Latest Ref Rng & Units 02/20/2020 02/15/2020 02/12/2020  WBC 4.0 - 10.5 K/uL 9.3 8.9 9.4  Hemoglobin 12.0 - 15.0 g/dL 7.1(L) 7.0(L) 7.3(L)  Hematocrit 36.0 - 46.0 % 23.5(L) 22.9(L) 23.8(L)  Platelets 150 - 400 K/uL 53(L) 71(L) 76(L)    CMP Latest Ref Rng & Units 02/20/2020 02/15/2020 02/12/2020  Glucose 70 - 99 mg/dL 132(H) 148(H) 120(H)  BUN 8 - 23 mg/dL 58(H) 48(H) 50(H)  Creatinine 0.44 - 1.00 mg/dL 3.46(H) 4.15(H) 4.82(H)  Sodium 135 - 145 mmol/L 132(L) 135 134(L)  Potassium 3.5 - 5.1 mmol/L 6.4(HH) 5.2(H) 5.1  Chloride 98 - 111 mmol/L 107 108 104  CO2 22 - 32 mmol/L 13(L) 18(L) 20(L)  Calcium 8.9 - 10.3 mg/dL 7.4(L) 7.5(L) 7.8(L)  Total Protein 6.0 - 8.3 g/dL - - -  Total Bilirubin 0.2 - 1.2 mg/dL - - -  Alkaline Phos 39 - 117 U/L - - -  AST 0 - 37 U/L - - -  ALT 0 - 35 U/L - - -     PATHOLOGY: Surgical Pathology  * THIS IS AN ADDENDUM REPORT * CASE: [id number]  PATIENT: Siera Wadel  Bone Marrow Report  *Addendum *  Reason for Addendum #1: Cytogenetics results   Clinical History: elevated M-spike    DIAGNOSIS:   BONE MARROW, ASPIRATE, CLOT, CORE:  - Hypercellular marrow involved by plasma cell myeloma  - See microscopic description below   PERIPHERAL BLOOD:  - Normocytic anemia and thrombocytopenia  - Leukocytosis  - See complete blood cell count  MICROSCOPIC DESCRIPTION:   PERIPHERAL BLOOD SMEAR: The peripheral blood has a normocytic anemia and  thrombocytopenia. There is rouleaux formation present. There is a  leukocytosis with  a predominance of neutrophils; however, there are  plasmacytoid lymphocytes present. In addition, there is a mild  maturational left shift noted.   BONE MARROW ASPIRATE: Spicular, cellular and adequate for evaluation  Erythroid precursors: Diminished numbers without overt dysplasia  Granulocytic precursors: Diminished numbers without overt dysplasia  Megakaryocytes: Qualitatively unremarkable  Lymphocytes/plasma cells: Plasma cells are markedly increased with  atypical features including multinucleation and prominent nucleoli. The  plasma cells comprise 67% of cells by manual differential counts. There  is no lymphocytosis.   TOUCH PREPARATIONS: Hypocellular with no additional findings as compared  to the aspirate smears.   CLOT AND BIOPSY: Examination of the core biopsy reveals a fragmented but  hypercellular bone marrow. The clot section is also hypercellular with  a markedly increased and atypical plasma cell infiltrate. By CD138  immunohistochemistry, plasma cells comprise plasma cells comprise almost  100% of the marrow cellularity. The plasma cells are lambda restricted  by kappa and lambda in situ hybridization. Background hematopoiesis is  markedly diminished. There are no atypical lymphoid aggregates or  granulomas identified.   IRON STAIN: Iron stains are performed on a bone marrow aspirate or touch  imprint smear and section of clot. The controls stained appropriately.     Storage Iron: Present    Ring Sideroblasts: Not identified   ADDITIONAL DATA/TESTING: Cytogenetics is pending and will be reported  separately. FISH (MM panel) is available upon request.   CELL COUNT DATA:   Bone Marrow count performed on 500 cells shows:  Blasts:  0%  Myeloid: 14%  Promyelocytes: 0%  Erythroid:   7%  Myelocytes:  1%  Lymphocytes:  10%  Metamyelocytes:   0%  Plasma cells: 67%  Bands:  4%  Neutrophils:  8%  M:E ratio:   2.00  Eosinophils:  1%    Basophils:   0%  Monocytes:   2%   Lab Data: CBC performed on 01/30/2020 shows:  WBC: 10.8 k/uL Neutrophils:  56%  Hgb: 8.5 g/dL Lymphocytes:  20%  HCT: 26.8 %  Monocytes:   24%  MCV: 87.0 fL  Eosinophils:  0%  RDW: 16.1 %  Basophils:   0%  PLT: 110 k/uL   GROSS DESCRIPTION:   Specimen A: Aspirate smear   Specimen B: Received in B-plus is a 0.8 x 0.6 x 0.1 cm aggregate of  tan-red soft material, submitted in 1 block.   Specimen C: Received in B-plus are 2 cores of tan-white to dark red firm  tissue, 0.6 x 0.2 cm and 0.7 x 0.2 cm, submitted in 1 block following  decalcification in Immunocal.   SW 01/30/2020     Final Diagnosis performed by Thressa Sheller, MD.  Electronically signed  02/01/2020  Technical component performed at [facility], [place]  [address], [place], [place] [address].  Professional component performed at [employer]. [facility],  [place] [address], [place], [place] [address].  Immunohistochemistry Technical component (if applicable) was performed  at [facility]. [address] [place],  [place], [place] [address].  IMMUNOHISTOCHEMISTRY DISCLAIMER (if applicable):  Some of these immunohistochemical stains may have been developed and the  performance characteristics determine by [facility]. Some  may not have been cleared or approved by the U.S. Food and Drug  Administration. The FDA has determined that such  clearance or approval  is not necessary. This test is used for clinical purposes. It should not  be regarded as investigational or for research. This laboratory is  certified under the [place]  (CLIA-88) as qualified to perform high complexity clinical laboratory  testing. The controls stained appropriately.   ADDENDUM:   ** Please note this testing was performed and interpreted by an outside  facility. This addendum is only being  added to provide a summary of the  results for report completeness. Please see electronic medical record  for a copy of the full report. **   Karyotype:  47,?X,?-?X,?+?1,?+?1,?add(?1)?(?p11.?2)?,?add(?1)?(?p11.?2)?,?add(?1)?(?  q21)?,?-?4,?i(?5)?(?p10)?,?+?6,?+?9,?-?10,?-?13,?+?15,?-?16,?+?19[?2]?/?  46,?XX[?18]?   Interpretation: ABNORMAL FEMALE KARYOTYPE, NEAR DIPLOID, COMPLEX. SEE  COMMENT.   Comment: This study is performed on unstimulated cultures and cultures  stimulated with interleukin-4 as a plasma cell mitogen. Two cells  analyzed, both from the unstimulated culture system, demonstrate a  complex near-diploid karyotype characterized by gains of chromosomes 1,  6, 9, 15, and 19; loss of chromosomes 4, 10, 13, and 16; additional  chromatin replacing the short arm of two of the chromosome 1 homologues  and the distal long arm of one chromosome 1 homologue; and an  isochromosome formed from the short arm of chromosome 5. The remaining  cells are cytogenetically normal.   This karyotype is consistent with marrow involvement by a plasma cell  neoplasm in the appropriate clinical setting and in that setting is an  unfavorable (high-risk) prognostic sign owing to the gain of 1q and the  loss of chromosome 13. Although there is loss of CDKN2C in two  homologues of chromosome 1, there is a diploid representation of the  locus at 1p32. Note also that this karyotype will result in an  incorrect classification by [facility] as hyperdiploid if results of the  04/28/14 probe set are used in isolation. The demonstration of abnormal  metaphases in this setting is associated with advanced stage/higher  tumor burden.  Correlation with clinical and morphologic findings is  required for further integration of these results.   Addendum #1 performed by Thressa Sheller, MD.  Electronically signed  02/09/2020  Technical component performed at [place]  [address],  [place], [place] [address].  Professional component performed at [employer]. [facility],  [place] [address], [place], [place] [address].  Immunohistochemistry Technical component (if applicable) was performed  at [facility]. [address] [place],  [place], [place] [address].  IMMUNOHISTOCHEMISTRY DISCLAIMER (if applicable):  Some of these immunohistochemical stains may have been developed and the  performance characteristics determine by [facility]. Some  may not have been cleared or approved by the U.S. Food and Drug  Administration. The FDA has determined that such clearance or approval  is not necessary. This test is used for clinical purposes. It should not  be regarded as investigational or for research. This laboratory is  certified under the [place]  (CLIA-88) as qualified to perform high complexity clinical laboratory  testing. The controls stained appropriately.    RADIOGRAPHIC STUDIES: I have personally reviewed the radiological images as listed and agreed with the findings in the report:   CLINICAL DATA:  Low back pain.  Chronic kidney disease stage 3.  EXAM: METASTATIC BONE SURVEY  COMPARISON:  None.  FINDINGS: Multiple small rounded lucencies are noted in the visualized skull which may represent venous lakes, but lytic lesions cannot be excluded. There  is noted a lytic destructive lesion seen involving the proximal right fibular shaft concerning for metastatic disease or malignancy. No other focal abnormality is noted.  IMPRESSION: Multiple small rounded lucencies are noted in the visualized skull which may represent venous lakes, but lytic lesions related to multiple myeloma metastatic disease cannot be excluded. Also noted is lytic destructive lesion seen involving the proximal right fibular shaft consistent with metastatic disease or malignancy.   Electronically Signed    By: Marijo Conception M.D.   On: 01/25/2020 11:27  ASSESSMENT & PLAN Carolyn Myers 84 y.o. female with medical history significant for newly diagnosed multiple myeloma who presents for a second opinion regarding care.  Our meeting today was briefer than I had intended as the patient was very clear with her wishes.  She notes that she is ready to die and she does not wish to undergo any further treatment.  She does note that she is very uncomfortable and wishes simply to go home and lie down and undergo no further testing and evaluation.  Given her markedly deconditioned state and her advanced age I do think that this is a reasonable approach moving forward.  I would recommend hospice and comfort care measures only from this point moving on.  I did address the concerns of the son and daughter who are present today.  I noted that multiple myeloma is indeed a terminal condition if left untreated.  I also noted that the treatment would be inappropriate in a patient who was so badly deconditioned.  I briefly explained what therapies would entail and what the expectation would be.  I also noted that my concern is and her current state that treatment with chemotherapy may actually shorten lifespan and make the patient less comfortable in the time that she does have remaining.  The son and daughter voiced their understanding and at the end of our visit were in agreement that hospice care was most appropriate.  #Newly Diagnosed Multiple Myeloma --the patient is clear in our discussion today that she does not wish to have any further treatment/evaluation and she is ready to pass away --I believe that Mrs. Segers's decision is reasonable and as such I agree with hospice care moving forward. She is markedly deconditioned and would not be an appropriate candidate for treatment at this time. I agree that comfort based measures alone would be most appropriate. --patient has established with hospice care. Defer to  hospice for management of the patient's pain and symptoms.  --I do not recommend any further labs, imaging, or invasive testing moving forward. --if there is anything we can do in the future to assist in the care of this patient please do make [place] aware. As for now no further f/u with our clinic is required.   No orders of the defined types were placed in this encounter.   All questions were answered. The patient knows to call the clinic with any problems, questions or concerns.  A total of more than 35 minutes were spent on this encounter and over half of that time was spent on counseling and coordination of care as outlined above.   Ledell Peoples, MD Department of Hematology/Oncology [place] at [facility] Phone: [phone] Pager: [phone] Email: [email or web address]  03/02/2020 2:07 PM

## 2020-03-04 ENCOUNTER — Telehealth: Payer: Self-pay

## 2020-03-04 NOTE — Telephone Encounter (Signed)
Carolyn Myers has opted not to pursue further treatment of her cancer at this time, she is not taking any medications No alternative medication needed at this time

## 2020-03-04 NOTE — Telephone Encounter (Signed)
Fluoxetine not covered by Pt's insurance- preferred alternatives: citalopram, escitalopram, sertraline, Viibyrd, or Trintillex.

## 2020-03-07 ENCOUNTER — Telehealth: Payer: Self-pay | Admitting: Internal Medicine

## 2020-03-13 NOTE — Telephone Encounter (Signed)
I received a fax from [facility] [place] that pt passed away on 30-Mar-2023 at 5:25 am.     Called daughter and offered my condolences.  We are not sure where her death cert is- perhaps hospice completed for [place].  I am out of town next week and spoke with my partner Dr Larose Kells who kindly agreed to sign the death cert if it is needed while I am away Patient died of multiple myeloma, dx 1 month prior to her death   [place]: Please update pt status

## 2020-03-14 DIAGNOSIS — M6281 Muscle weakness (generalized): Secondary | ICD-10-CM | POA: Diagnosis not present

## 2020-03-14 DIAGNOSIS — N183 Chronic kidney disease, stage 3 unspecified: Secondary | ICD-10-CM | POA: Diagnosis not present

## 2020-03-15 ENCOUNTER — Encounter (HOSPITAL_COMMUNITY): Payer: Self-pay

## 2020-03-21 DEATH — deceased

## 2020-03-29 ENCOUNTER — Ambulatory Visit: Payer: Medicare Other | Admitting: Radiation Oncology

## 2020-04-14 DIAGNOSIS — N183 Chronic kidney disease, stage 3 unspecified: Secondary | ICD-10-CM | POA: Diagnosis not present

## 2020-04-14 DIAGNOSIS — M6281 Muscle weakness (generalized): Secondary | ICD-10-CM | POA: Diagnosis not present

## 2020-05-09 ENCOUNTER — Other Ambulatory Visit: Payer: Self-pay | Admitting: Family Medicine
